# Patient Record
Sex: Female | Born: 1955
Health system: Southern US, Community
[De-identification: ages and names within clinical notes are randomized; demographics above are authoritative.]

## PROBLEM LIST (undated history)

## (undated) DIAGNOSIS — Z87440 Personal history of urinary (tract) infections: Secondary | ICD-10-CM

## (undated) DIAGNOSIS — R112 Nausea with vomiting, unspecified: Secondary | ICD-10-CM

## (undated) DIAGNOSIS — N898 Other specified noninflammatory disorders of vagina: Secondary | ICD-10-CM

## (undated) DIAGNOSIS — R300 Dysuria: Principal | ICD-10-CM

## (undated) DIAGNOSIS — E785 Hyperlipidemia, unspecified: Secondary | ICD-10-CM

## (undated) DIAGNOSIS — Z5189 Encounter for other specified aftercare: Secondary | ICD-10-CM

## (undated) DIAGNOSIS — R319 Hematuria, unspecified: Secondary | ICD-10-CM

## (undated) DIAGNOSIS — M8580 Other specified disorders of bone density and structure, unspecified site: Secondary | ICD-10-CM

## (undated) DIAGNOSIS — Z9889 Other specified postprocedural states: Secondary | ICD-10-CM

## (undated) HISTORY — DX: Other specified disorders of bone density and structure, unspecified site: M85.80

## (undated) HISTORY — DX: Personal history of urinary (tract) infections: Z87.440

## (undated) HISTORY — DX: Encounter for other specified aftercare: Z51.89

## (undated) HISTORY — DX: Hematuria, unspecified: R31.9

## (undated) HISTORY — PX: EYE SURGERY: SHX253

## (undated) HISTORY — DX: Dysuria: R30.0

## (undated) HISTORY — DX: Hyperlipidemia, unspecified: E78.5

## (undated) HISTORY — PX: ABDOMINAL HYSTERECTOMY: SHX81

## (undated) HISTORY — PX: BREAST BIOPSY: SHX20

## (undated) HISTORY — DX: Other specified noninflammatory disorders of vagina: N89.8

## (undated) HISTORY — PX: BREAST EXCISIONAL BIOPSY: SUR124

## (undated) HISTORY — PX: FRACTURE SURGERY: SHX138

## (undated) HISTORY — PX: TONSILLECTOMY: SUR1361

## (undated) HISTORY — PX: DIAGNOSTIC LAPAROSCOPY: SUR761

---

## 2006-04-17 ENCOUNTER — Encounter: Admission: RE | Admit: 2006-04-17 | Discharge: 2006-04-17 | Payer: Self-pay | Admitting: Obstetrics and Gynecology

## 2006-05-20 ENCOUNTER — Ambulatory Visit: Payer: Self-pay | Admitting: Gastroenterology

## 2006-06-12 ENCOUNTER — Ambulatory Visit (HOSPITAL_COMMUNITY): Admission: RE | Admit: 2006-06-12 | Discharge: 2006-06-12 | Payer: Self-pay | Admitting: Obstetrics & Gynecology

## 2006-06-19 ENCOUNTER — Ambulatory Visit (HOSPITAL_COMMUNITY): Admission: RE | Admit: 2006-06-19 | Discharge: 2006-06-19 | Payer: Self-pay | Admitting: Gastroenterology

## 2006-06-19 ENCOUNTER — Ambulatory Visit: Payer: Self-pay | Admitting: Gastroenterology

## 2006-10-30 ENCOUNTER — Encounter: Admission: RE | Admit: 2006-10-30 | Discharge: 2006-10-30 | Payer: Self-pay | Admitting: Obstetrics & Gynecology

## 2007-05-04 ENCOUNTER — Encounter: Admission: RE | Admit: 2007-05-04 | Discharge: 2007-05-04 | Payer: Self-pay | Admitting: Obstetrics & Gynecology

## 2007-11-02 ENCOUNTER — Encounter: Admission: RE | Admit: 2007-11-02 | Discharge: 2007-11-02 | Payer: Self-pay | Admitting: Obstetrics & Gynecology

## 2008-10-20 ENCOUNTER — Encounter: Admission: RE | Admit: 2008-10-20 | Discharge: 2008-10-20 | Payer: Self-pay | Admitting: Obstetrics & Gynecology

## 2009-05-15 ENCOUNTER — Ambulatory Visit: Payer: Self-pay | Admitting: Family Medicine

## 2009-05-24 ENCOUNTER — Ambulatory Visit (HOSPITAL_COMMUNITY): Admission: RE | Admit: 2009-05-24 | Discharge: 2009-05-24 | Payer: Self-pay | Admitting: Obstetrics & Gynecology

## 2009-09-09 ENCOUNTER — Ambulatory Visit: Payer: Self-pay | Admitting: Family Medicine

## 2009-11-20 ENCOUNTER — Encounter: Admission: RE | Admit: 2009-11-20 | Discharge: 2009-11-20 | Payer: Self-pay | Admitting: Obstetrics & Gynecology

## 2010-02-26 NOTE — Letter (Signed)
Summary: Handout Printed  Printed Handout:  - Abscess/Boil (Furuncle), Easy-to-Read

## 2010-02-26 NOTE — Assessment & Plan Note (Signed)
Summary: SPIDER BITES ON LEFT LEG/EVM   Vital Signs:  Patient Profile:   55 Years Old Female CC:      insect bite right anterior leg Height:     64 inches Weight:      112 pounds BMI:     19.29 Temp:     98 degrees F oral Pulse rate:   76 / minute Pulse rhythm:   regular Resp:     18 per minute BP sitting:   110 / 60  (left arm) Cuff size:   regular  Vitals Entered By: Providence Crosby LPN (September 09, 2009 12:20 PM)                  Current Allergies (reviewed today): No known allergies History of Present Illness History from: patient Reason for visit: 8 days ago ? insect bite Chief Complaint: insect bite right anterior leg History of Present Illness: 8 days ago noted bite, painful with walking and touch; gradually getting worse reddened area 4cm in diameter posterior of right leg on calf, pt not sure if this was a true insect bite but is working hard to help it to heal but no significant improvements.  Pt has been using baking soda and other homeopathic remedies but to no avail.  She is afraid of developing cellulitis again.  She had this in April and it was really frightening.  Pt has been having some fever and chills as well.  Pt is really concerned that the lesions are infected.  She works in the health care environment.    REVIEW OF SYSTEMS Constitutional Symptoms       Complains of fever and chills.     Denies night sweats, weight loss, weight gain, and fatigue.      Comments: occasional reported Eyes       Denies change in vision, eye pain, eye discharge, glasses, contact lenses, and eye surgery. Ear/Nose/Throat/Mouth       Denies hearing loss/aids, change in hearing, ear pain, ear discharge, dizziness, frequent runny nose, frequent nose bleeds, sinus problems, sore throat, hoarseness, and tooth pain or bleeding.  Respiratory       Denies dry cough, productive cough, wheezing, shortness of breath, asthma, bronchitis, and emphysema/COPD.  Cardiovascular       Denies  murmurs, chest pain, and tires easily with exhertion.    Gastrointestinal       Denies stomach pain, nausea/vomiting, diarrhea, constipation, blood in bowel movements, and indigestion. Genitourniary       Denies painful urination, kidney stones, and loss of urinary control. Neurological       Denies paralysis, seizures, and fainting/blackouts. Musculoskeletal       Denies muscle pain, joint pain, joint stiffness, decreased range of motion, redness, swelling, muscle weakness, and gout.  Skin       Denies bruising, unusual mles/lumps or sores, and hair/skin or nail changes.      Comments: red area right calf Psych       Denies mood changes, temper/anger issues, anxiety/stress, speech problems, depression, and sleep problems.  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 [employer] Physical Exam General appearance: well developed, well nourished, no acute distress Head: normocephalic, atraumatic Eyes: conjunctivae and lids normal Pupils: equal, round, reactive to light Ears: normal, no lesions or deformities Nasal: mucosa pink, nonedematous, no septal deviation, turbinates normal Oral/Pharynx: tongue normal, posterior pharynx without erythema or exudate Neck: neck supple,  trachea midline, no  masses Chest/Lungs: no rales, wheezes, or rhonchi bilateral, breath sounds equal without effort Heart: regular rate and  rhythm, no murmur Abdomen: soft, non-tender without obvious organomegaly Extremities: Patient had 3 small carbuncles on the posterior right leg in the mid-calf area, one of the lesions is red and tender to palpation, small hole in the center of the lesion, healing scab over the other 2 lesions, no fluctuance noticed, no lymphadenopathy noted, no calf cords palpated, pedal pulses 2+ bilateral Neurological: grossly intact and non-focal Skin: no obvious rashes or lesions MSE: oriented to time, place, and person Assessment New Problems: CARBUNCLE AND FURUNCLE OF LEG EXCEPT FOOT (ICD-680.6) CARBUNCLE, LEG (ICD-680.6) CELLULITIS/ABSCESS, LEG (ICD-682.6)   Patient Education: continue local wound care to the lesions, including topical antibiotic ointment, warm, moist compresses to the area to draw out the toxin.  Demonstrates willingness to comply.  Plan New Medications/Changes: DOXYCYCLINE HYCLATE 100 MG CAPS (DOXYCYCLINE HYCLATE) take 1 by mouth two times a day with food, avoid heavy sun exposure, can cause sun sensitivity, don't take if pregnant.  #14 x 0, 09/09/2009, Standley Dakins MD  New Orders: Est. Patient Level IV [16109] Prescription Created Electronically [phone] Follow Up: Follow up in 2-3 days if no improvement, Follow up on an as needed basis, Follow up with Primary Physician  The patient and/or caregiver has been counseled thoroughly with regard to medications prescribed including dosage, schedule, interactions, rationale for use, and possible side effects and they verbalize understanding.  Diagnoses and expected course of recovery discussed and will return if not improved as expected or if the condition worsens. Patient and/or caregiver verbalized understanding.  Prescriptions: DOXYCYCLINE HYCLATE 100 MG CAPS (DOXYCYCLINE HYCLATE) take 1 by mouth two times a  day with food, avoid heavy sun exposure, can cause sun sensitivity, don't take if pregnant.  #14 x 0   Entered and Authorized by:   Standley Dakins MD   Signed by:   Standley Dakins MD on 09/09/2009   Method used:   Electronically to        [facility] (retail)       [address], [address]       [place], [place]  [address]       Ph: [phone]       Fax: [phone]   RxID:   [id number]   Patient Instructions: 1)  Continue local wound care to the lesion.  Continue topical antibiotic ointment to the lesion and warm, moist compresses to the lesion.   2)  The patient was informed that there is no on-call provider or services available at this clinic during off-hours (when the clinic is closed).  If the patient developed a problem or concern that required immediate attention, the patient was advised to go the the nearest available urgent care or emergency department for medical care.  The patient verbalized understanding.    3)  It was clearly explained to the patient that this [facility] is not intended to be a primary care clinic.  The patient is always better served by the continuity of care and the provider/patient relationships developed with their dedicated primary care provider.  The patient was told to be sure to follow up as soon as possible with their primary care provider to discuss treatments received and to receive further examination and testing.  The patient verbalized understanding. The will f/u with PCP ASAP.  4)  The patient's prescriptions were checked for possible interactions and electronically sent to the pharmacy of choice. 5)  The risks, benefits and possible side effects were clearly explained and discussed with the patient.  The patient verbalized clear understanding.  The patient was given instructions to return if symptoms don't improve, worsen or new changes develop.  If it is not during clinic hours and the patient  cannot get back to this clinic then the patient was told to seek medical care at an available urgent care or emergency department.  The patient verbalized understanding.      Orders Added: 1)  Est. Patient Level IV [14782] 2)  Prescription Created Electronically [phone]

## 2010-02-26 NOTE — Assessment & Plan Note (Signed)
Summary: UNKNOWN   Vital Signs:  Patient Profile:   55 Years Old Female CC:      Tick bite lower left back x 5 days Height:     64 inches Weight:      118 pounds O2 Sat:      100 % O2 treatment:    Room Air Temp:     96.3 degrees F oral Pulse rate:   69 / minute Pulse rhythm:   regular Resp:     12 per minute BP sitting:   106 / 63  (right arm) Cuff size:   regular  Vitals Entered By: Emilio Math (May 15, 2009 12:01 PM)                  Current Allergies: No known allergies History of Present Illness Chief Complaint: Tick bite lower left back x 5 days History of Present Illness: Subjective:  Patient complains of two tick bites on lower back that she noticed 3 days ago.  She was able to remove the ticks, which were about 3 to 4mm dia.  She feels well.  No fevers, chills, and sweats, headache, malaise, arthralgias, etc.  No rash.  Current Meds DAILY MULTIPLE VITAMINS  TABS (MULTIPLE VITAMIN)  SM CORAL CALCIUM 1000 (390 CA) MG TABS (CORAL CALCIUM)  DOXYCYCLINE HYCLATE 100 MG CAPS (DOXYCYCLINE HYCLATE) One by mouth two times a day  REVIEW OF SYSTEMS Constitutional Symptoms      Denies fever, chills, night sweats, weight loss, weight gain, and fatigue.  Eyes       Denies change in vision, eye pain, eye discharge, glasses, contact lenses, and eye surgery. Ear/Nose/Throat/Mouth       Denies hearing loss/aids, change in hearing, ear pain, ear discharge, dizziness, frequent runny nose, frequent nose bleeds, sinus problems, sore throat, hoarseness, and tooth pain or bleeding.  Respiratory       Denies dry cough, productive cough, wheezing, shortness of breath, asthma, bronchitis, and emphysema/COPD.  Cardiovascular       Denies murmurs, chest pain, and tires easily with exhertion.    Gastrointestinal       Denies stomach pain, nausea/vomiting, diarrhea, constipation, blood in bowel movements, and indigestion. Genitourniary       Denies painful urination, kidney stones,  and loss of urinary control. Neurological       Denies paralysis, seizures, and fainting/blackouts. Musculoskeletal       Complains of redness and swelling.      Denies muscle pain, joint pain, joint stiffness, decreased range of motion, muscle weakness, and gout.  Skin       Denies bruising, unusual mles/lumps or sores, and hair/skin or nail changes.  Psych       Denies mood changes, temper/anger issues, anxiety/stress, speech problems, depression, and sleep problems.  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  No tenderness or fluctuance. Assessment New Problems: CELLULITIS, TRUNK (ICD-682.2) TICK BITE (ICD-E906.4)   Plan New Medications/Changes: DOXYCYCLINE HYCLATE 100 MG CAPS (DOXYCYCLINE HYCLATE) One by mouth two times a day  #14 x 0, 05/15/2009, Donna Christen MD  New  Orders: New Patient Level III [99203] Planning Comments:   Begin doxycycline 100mg  two times a day for one week.  Return for worsening symptoms:  headache, rash, fever, arthralgias, etc.   The patient and/or caregiver has been counseled thoroughly with regard to medications prescribed including dosage, schedule, interactions, rationale for use, and possible side effects and they verbalize understanding.  Diagnoses and expected course of recovery discussed and will return if not improved as expected or if the condition worsens. Patient and/or caregiver verbalized understanding.  Prescriptions: DOXYCYCLINE HYCLATE 100 MG CAPS (DOXYCYCLINE HYCLATE) One  by mouth two times a day  #14 x 0   Entered and Authorized by:   Donna Christen MD   Signed by:   Donna Christen MD on 05/15/2009   Method used:   Print then Give to Patient   RxID:   [id number]

## 2010-06-11 NOTE — Op Note (Signed)
NAME:  Kylie Duffy, Kylie Duffy                 ACCOUNT NO.:  [id number]   MEDICAL RECORD NO.:  [id number]          PATIENT TYPE:  AMB   LOCATION:  DAY                           FACILITY:  [facility]   PHYSICIAN:  Kassie Mends, M.D.      DATE OF BIRTH:  21-Jun-1955   DATE OF PROCEDURE:  06/19/3006  DATE OF DISCHARGE:                                PROCEDURE NOTE   REFERRING PHYSICIAN:  Tilda Burrow, M.D.   PROCEDURE:  Colonoscopy.   INDICATION FOR EXAMINATION:  [name] is a 55 year old female who has  intermittent bright red blood per rectum and was found to have heme-  positive stools.  She presents for evaluation for these findings and for  colon cancer screening.   FINDINGS:  1. External hemorrhoids seen on rectal exam.  2. Normal colon without evidence of polyps, masses, inflammatory      changes, diverticula, AVMs or hemorrhoids.   RECOMMENDATIONS:  1. She should follow a high-fiber diet.  She was given a handout on      high-fiber diet  and hemorrhoids.  2. Screening colonoscopy in 10 years.   MEDICATIONS:  1. Demerol 100 mg IV.  2. Versed 7 mg IV.   PROCEDURAL TECHNIQUE:  Physical exam was performed.  Informed consent  was obtained from the patient.  I explained the benefits, risks and  alternatives to the procedure.  The patient was connected to the monitor  and placed in the left lateral position.  Continuous oxygen was provided  by nasal cannula and IV medicine administered through an indwelling  cannula.  After administration of sedation and rectal exam, the  patient's rectum was intubated and the scope was advanced under direct  visualization to the cecum.  The patient, in spite of sedation, was  agitated  during the exam and did require some restraint during the exam.  The  scope was subsequently withdrawn by carefully examining the color,  texture, anatomy and integrity of the mucosa on the way out.  The  patient was recovered in endoscopy and discharged home in  satisfactory  condition.      Kassie Mends, M.D.  Electronically Signed     SM/MEDQ  D:  06/19/2006  T:  06/19/2006  Job:  [id number]   cc:   Tilda Burrow, M.D.  Fax: [phone]

## 2010-06-14 NOTE — Consult Note (Signed)
NAME:  Kylie Duffy, Kylie Duffy                 ACCOUNT NO.:  [id number]   MEDICAL RECORD NO.:  [id number]         PATIENT TYPE:  AMB   LOCATION:                                FACILITY:  [facility]   PHYSICIAN:  Kassie Mends, M.D.      DATE OF BIRTH:  1955/02/09   DATE OF CONSULTATION:  05/20/2006  DATE OF DISCHARGE:                                 CONSULTATION   REASON FOR CONSULTATION:  Heme-positive stool.   REQUESTING PHYSICIAN:  Tilda Burrow, M.D.   HISTORY OF PRESENT ILLNESS:  Patient is a 55 year old Caucasian female  patient of Dr. Christin Bach who presents for further evaluation of heme-  positive stool found on digital rectal exam recently.  She has chronic  intermittent hematochezia.  She states that this has been due to  hemorrhoids.  She has a history of thrombosed hemorrhoid requiring  excision in March 2005.  This is complicated by several days of  significant bleeding but did not require transfusion.  She states that  she has done fairly well since that time.  As long as she eats a healthy  diet, she has regular bowel movements.  No melena.  No abdominal pain,  nausea, vomiting, heartburn, dysphagia, odynophagia, or weight loss.  She has never had a colonoscopy.   CURRENT MEDICATIONS:  1. Multivitamins daily.  2. Calcium supplement daily.   ALLERGIES:  No known drug allergies.   PAST MEDICAL HISTORY:  Negative for chronic illnesses.   PAST SURGICAL HISTORY:  She had surgery for ectopic pregnancy, a  hysterectomy in 2002, a D&C, and thrombosed hemorrhoids.   FAMILY HISTORY:  Father has CAD.  Has had cardiac stents.  Mother has  hypercholesterolemia, hypothyroidism, hypertension.  No family history  of colorectal cancer or polyps.   SOCIAL HISTORY:  She is married with no children.  She is from  [place] and has been in the area only recently.  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  Nonsmoker.  Infrequent  alcohol use.   REVIEW OF  SYSTEMS:  See HPI for GI, constitutional.  CARDIOPULMONARY:  No chest pain, shortness of breath, or palpitations.   PHYSICAL EXAMINATION:  VITAL SIGNS:  Weight 118.  Height 5 feet 4.  Temp  98.7, blood pressure 102/68, pulse 84.  GENERAL:  A pleasant, well-developed and well-nourished Caucasian female  in no acute distress.  SKIN:  Warm and dry with no jaundice.  HEENT:  Sclerae are anicteric.  Oropharyngeal mucosa is moist and pink.  No lesions, erythema, or exudate.  NECK:  No lymphadenopathy or thyromegaly.  CHEST:  Lungs are clear to auscultation.  CARDIAC:  Regular rate and rhythm.  Normal S1 and S2.  No murmurs, rubs  or gallops.  ABDOMEN:  Positive bowel sounds.  Abdomen is soft, nontender and  nondistended.  No organomegaly or masses.  No rebound tenderness or  guarding.  No abdominal bruits or hernias.  EXTREMITIES:  No edema.   IMPRESSION:  Kylie Duffy is a 55 year old lady with a history of  intermittent hematochezia who recently  had heme-positive stools on  digital rectal exam.  She has never had a colonoscopy.  Recommend  diagnostic colonoscopy at this time for further evaluation.  I discussed  the risks, benefits and alternatives with regards to the risk of  reaction to medication, bleeding, infection, perforation.  She is  agreeable to proceed.   PLAN:  Colonoscopy in the near future with Dr. Kassie Mends.   I would like to thank Dr. Christin Bach for allowing [place] to take part in  the care of this patient.      Tana Coast, P.A.      Kassie Mends, M.D.  Electronically Signed    LL/MEDQ  D:  05/20/2006  T:  05/20/2006  Job:  [id number]   cc:   Tilda Burrow, M.D.  Fax: [phone]

## 2010-10-28 ENCOUNTER — Other Ambulatory Visit: Payer: Self-pay | Admitting: Obstetrics and Gynecology

## 2010-10-28 DIAGNOSIS — Z1231 Encounter for screening mammogram for malignant neoplasm of breast: Secondary | ICD-10-CM

## 2010-11-22 ENCOUNTER — Ambulatory Visit
Admission: RE | Admit: 2010-11-22 | Discharge: 2010-11-22 | Disposition: A | Discharge status: Home / Self Care | Payer: 59 | Source: Ambulatory Visit | Attending: Obstetrics and Gynecology | Admitting: Obstetrics and Gynecology

## 2010-11-22 DIAGNOSIS — Z1231 Encounter for screening mammogram for malignant neoplasm of breast: Secondary | ICD-10-CM

## 2011-02-07 ENCOUNTER — Other Ambulatory Visit: Payer: Self-pay | Admitting: Obstetrics & Gynecology

## 2011-02-07 DIAGNOSIS — N63 Unspecified lump in unspecified breast: Secondary | ICD-10-CM

## 2011-02-14 ENCOUNTER — Other Ambulatory Visit: Payer: 59

## 2011-02-21 ENCOUNTER — Other Ambulatory Visit: Payer: 59

## 2011-02-28 ENCOUNTER — Ambulatory Visit
Admission: RE | Admit: 2011-02-28 | Discharge: 2011-02-28 | Disposition: A | Discharge status: Home / Self Care | Payer: 59 | Source: Ambulatory Visit | Attending: Obstetrics & Gynecology | Admitting: Obstetrics & Gynecology

## 2011-02-28 DIAGNOSIS — N63 Unspecified lump in unspecified breast: Secondary | ICD-10-CM

## 2011-04-11 ENCOUNTER — Emergency Department (HOSPITAL_COMMUNITY): Payer: 59

## 2011-04-11 ENCOUNTER — Emergency Department (HOSPITAL_COMMUNITY)
Admission: EM | Admit: 2011-04-11 | Discharge: 2011-04-11 | Disposition: A | Discharge status: Home / Self Care | Payer: 59 | Attending: Emergency Medicine | Admitting: Emergency Medicine

## 2011-04-11 ENCOUNTER — Encounter (HOSPITAL_COMMUNITY): Payer: Self-pay

## 2011-04-11 DIAGNOSIS — S0990XA Unspecified injury of head, initial encounter: Secondary | ICD-10-CM | POA: Insufficient documentation

## 2011-04-11 DIAGNOSIS — S00209A Unspecified superficial injury of unspecified eyelid and periocular area, initial encounter: Secondary | ICD-10-CM | POA: Insufficient documentation

## 2011-04-11 DIAGNOSIS — R51 Headache: Secondary | ICD-10-CM | POA: Insufficient documentation

## 2011-04-11 DIAGNOSIS — R220 Localized swelling, mass and lump, head: Secondary | ICD-10-CM | POA: Insufficient documentation

## 2011-04-11 DIAGNOSIS — M25569 Pain in unspecified knee: Secondary | ICD-10-CM | POA: Insufficient documentation

## 2011-04-11 DIAGNOSIS — T07XXXA Unspecified multiple injuries, initial encounter: Secondary | ICD-10-CM

## 2011-04-11 DIAGNOSIS — W010XXA Fall on same level from slipping, tripping and stumbling without subsequent striking against object, initial encounter: Secondary | ICD-10-CM | POA: Insufficient documentation

## 2011-04-11 DIAGNOSIS — S8000XA Contusion of unspecified knee, initial encounter: Secondary | ICD-10-CM | POA: Insufficient documentation

## 2011-04-11 DIAGNOSIS — M25539 Pain in unspecified wrist: Secondary | ICD-10-CM | POA: Insufficient documentation

## 2011-04-11 DIAGNOSIS — R221 Localized swelling, mass and lump, neck: Secondary | ICD-10-CM | POA: Insufficient documentation

## 2011-04-11 DIAGNOSIS — IMO0002 Reserved for concepts with insufficient information to code with codable children: Secondary | ICD-10-CM | POA: Insufficient documentation

## 2011-04-11 DIAGNOSIS — T148XXA Other injury of unspecified body region, initial encounter: Secondary | ICD-10-CM

## 2011-04-11 DIAGNOSIS — S0003XA Contusion of scalp, initial encounter: Secondary | ICD-10-CM | POA: Insufficient documentation

## 2011-04-11 DIAGNOSIS — S0083XA Contusion of other part of head, initial encounter: Secondary | ICD-10-CM | POA: Insufficient documentation

## 2011-04-11 MED ORDER — BACITRACIN-NEOMYCIN-POLYMYXIN 400-5-5000 EX OINT
TOPICAL_OINTMENT | Freq: Once | CUTANEOUS | Status: AC
  Administered 2011-04-11: 1 via TOPICAL
  Filled 2011-04-11: qty 2

## 2011-04-11 MED ORDER — HYDROCODONE-ACETAMINOPHEN 7.5-500 MG/15ML PO SOLN: ORAL | Status: DC

## 2011-04-11 MED ORDER — HYDROCODONE-ACETAMINOPHEN 7.5-500 MG/15ML PO SOLN
5.0000 mL | Freq: Once | ORAL | Status: AC
  Administered 2011-04-11: 5 mL via ORAL
  Filled 2011-04-11: qty 15

## 2011-04-11 NOTE — Discharge Instructions (Signed)
Wrist Pain Wrist injuries are frequent in adults and children. A sprain is an injury to the ligaments that hold your bones together. A strain is an injury to muscle or muscle cord-like structures (tendons) from stretching or pulling. Generally, when wrists are moderately tender to touch following a fall or injury, a break in the bone (fracture) may be present. Most wrist sprains or strains are better in 3 to 5 days, but complete healing may take several weeks. HOME CARE INSTRUCTIONS   Put ice on the injured area.   Put ice in a plastic bag.   Place a towel between your skin and the bag.   Leave the ice on for 15 to 20 minutes, 3 to 4 times a day, for the first 2 days.   Keep your arm raised above the level of your heart whenever possible to reduce swelling and pain.   Rest the injured area for at least 48 hours or as directed by your caregiver.   If a splint or elastic bandage has been applied, use it for as long as directed by your caregiver or until seen by a caregiver for a follow-up exam.   Only take over-the-counter or prescription medicines for pain, discomfort, or fever as directed by your caregiver.   Keep all follow-up appointments. You may need to follow up with a specialist or have follow-up X-rays. Improvement in pain level is not a guarantee that you did not fracture a bone in your wrist. The only way to determine whether or not you have a broken bone is by X-ray.  SEEK IMMEDIATE MEDICAL CARE IF:   Your fingers are swollen, very red, white, or cold and blue.   Your fingers are numb or tingling.   You have increasing pain.   You have difficulty moving your fingers.  MAKE SURE YOU:   Understand these instructions.   Will watch your condition.   Will get help right away if you are not doing well or get worse.  Document Released: 10/23/2004 Document Revised: 01/02/2011 Document Reviewed: 03/06/2010 [facility] Patient Information 2012 [place], [place].   Your xrays and  Ct scans are normal today for any sign of serious internal injury.  Use the medicine prescribed for your pain,  Continue using ice (every hour for 5-10 minutes) for the next day.  Expect gradual improvement over the next week.

## 2011-04-11 NOTE — ED Notes (Signed)
Tripped and fell today, laceration to left eye, bil wrist pain, and left knee pain. Denies any loc.

## 2011-04-13 NOTE — ED Provider Notes (Signed)
History     CSN: [id number]  Arrival date & time 04/11/11  1333   First MD Initiated Contact with Patient 04/11/11 1402      Chief Complaint  Patient presents with  . Fall  . Facial Laceration    (Consider location/radiation/quality/duration/timing/severity/associated sxs/prior treatment) Patient is a 56 y.o. female presenting with fall. The history is provided by the patient.  Fall The accident occurred less than 1 hour ago. The fall occurred while walking (Patient tripped over a curb walking through a parking lot prior to arrival.). She landed on concrete. The volume of blood lost was minimal. The point of impact was the head, left knee and left wrist. The pain is present in the left wrist, left knee and head. The pain is at a severity of 6/10. The pain is moderate. She was ambulatory at the scene. There was no entrapment after the fall. Pertinent negatives include no visual change, no fever, no numbness, no abdominal pain, no nausea, no vomiting, no headaches and no loss of consciousness. The symptoms are aggravated by pressure on the injury. She has tried nothing for the symptoms.    History reviewed. No pertinent past medical history.  Past Surgical History  Procedure Date  . Abdominal hysterectomy   . Tonsillectomy     No family history on file.  History  Substance Use Topics  . Smoking status: Never Smoker   . Smokeless tobacco: Not on file  . Alcohol Use: No    OB History    Grav Para Term Preterm Abortions TAB SAB Ect Mult Living                  Review of Systems  Constitutional: Negative for fever.  HENT: Negative for hearing loss, nosebleeds, congestion, sore throat and neck pain.   Eyes: Negative.  Negative for pain and visual disturbance.  Respiratory: Negative for shortness of breath.   Cardiovascular: Negative for chest pain.  Gastrointestinal: Negative for nausea, vomiting and abdominal pain.  Genitourinary: Negative.   Musculoskeletal: Positive  for arthralgias. Negative for joint swelling.  Skin: Positive for wound. Negative for rash.  Neurological: Negative for dizziness, loss of consciousness, weakness, light-headedness, numbness and headaches.  Hematological: Negative.   Psychiatric/Behavioral: Negative.     Allergies  Review of patient's allergies indicates no known allergies.  Home Medications   Current Outpatient Rx  Name Route Sig Dispense Refill  . CALCIUM 600 + D PO Oral Take 1 tablet by mouth daily.    . ADULT MULTIVITAMIN W/MINERALS CH Oral Take 1 tablet by mouth daily.    Marland Kitchen HYDROCODONE-ACETAMINOPHEN 7.5-500 MG/15ML PO SOLN  1/2 to 1 teaspoon by mouth every 4 hours as needed for pain. 75 mL 0    BP 131/81  Pulse 70  Temp(Src) 98 F (36.7 C) (Oral)  Resp 20  Ht 5\' 4"  (1.626 m)  Wt 120 lb (54.432 kg)  BMI 20.60 kg/m2  SpO2 100%  Physical Exam  Nursing note and vitals reviewed. Constitutional: She is oriented to person, place, and time. She appears well-developed and well-nourished.  HENT:  Head: Normocephalic.    Right Ear: External ear normal.  Left Ear: External ear normal.  Nose: Nose normal. No sinus tenderness or nasal deformity. No epistaxis.  Mouth/Throat: Uvula is midline and oropharynx is clear and moist.       Abrasion at left lateral periorbital location,  Hemostatic (suspect injury from glasses hitting skin).   Upper lip contusion and edema,  Without laceration.  No dental injury,  Dentition intact and no abrupted gingiva.  Eyes: Conjunctivae and EOM are normal. Pupils are equal, round, and reactive to light.  Neck: Normal range of motion and full passive range of motion without pain. Neck supple. No spinous process tenderness and no muscular tenderness present.  Cardiovascular: Normal rate, regular rhythm, normal heart sounds and intact distal pulses.   Pulses:      Radial pulses are 2+ on the right side, and 2+ on the left side.  Pulmonary/Chest: Effort normal and breath sounds normal.    Abdominal: Soft. Bowel sounds are normal. There is no tenderness.  Musculoskeletal: Normal range of motion.       Left wrist: She exhibits bony tenderness. She exhibits normal range of motion, no swelling, no effusion, no crepitus and no deformity.       Left knee: She exhibits swelling, ecchymosis and bony tenderness. She exhibits no deformity and no erythema.  Neurological: She is alert and oriented to person, place, and time.  Skin: Skin is warm and dry.       Deep abrasion right dorsal middle phalanx.  Displays FROM of this digit without discomfort.  Distal sensation intact.  Psychiatric: She has a normal mood and affect.    ED Course  Procedures (including critical care time   Dg Wrist Complete Left  04/11/2011  *RADIOLOGY REPORT*  Clinical Data: Fall, facial laceration  LEFT WRIST - COMPLETE 3+ VIEW  Comparison: None.  Findings: No evidence of fracture of the distal radius or ulna. Radiocarpal joint is intact.  No carpal fracture.  IMPRESSION: No wrist fracture.  Original Report Authenticated By: Genevive Bi, M.D.   Ct Head Wo Contrast  04/11/2011  *RADIOLOGY REPORT*  Clinical Data:  Facial abrasions.  Fall.  CT HEAD WITHOUT CONTRAST CT MAXILLOFACIAL WITHOUT CONTRAST  Technique:  Multidetector CT imaging of the head and maxillofacial structures were performed using the standard protocol without intravenous contrast. Multiplanar CT image reconstructions of the maxillofacial structures were also generated.  Comparison:   None.  CT HEAD  Findings: The brain stem, cerebellum, cerebral peduncles, thalami, basal ganglia, basilar cisterns, and ventricular system appear unremarkable.  No intracranial hemorrhage, mass lesion, or acute infarction is identified.  IMPRESSION:  No significant abnormality identified.  CT MAXILLOFACIAL  Findings:   Left infraorbital soft tissue swelling noted.  No facial fracture is observed.  No significant sinusitis.  Middle turbinate concha bullosa noted  bilaterally.  There is considerable right facet arthropathy at C3-4.  IMPRESSION:  1.  Left infraorbital soft tissue swelling, without significant intra orbital abnormality and without facial fracture. 2.  Right facet arthropathy at C3-4 and to a lesser extent at C4-5.  Original Report Authenticated By: Dellia Cloud, M.D.   Dg Knee Complete 4 Views Left  04/11/2011  *RADIOLOGY REPORT*  Clinical Data: Fall, facial laceration.  LEFT KNEE - COMPLETE 4+ VIEW  Comparison: None.  Findings: No acute bony abnormality.  Specifically, no fracture, subluxation, or dislocation.  Soft tissues are intact.  No joint effusion.  IMPRESSION: No acute bony abnormality.  The  Original Report Authenticated By: Cyndie Chime, M.D.   Ct Maxillofacial Wo Cm  04/11/2011  *RADIOLOGY REPORT*  Clinical Data:  Facial abrasions.  Fall.  CT HEAD WITHOUT CONTRAST CT MAXILLOFACIAL WITHOUT CONTRAST  Technique:  Multidetector CT imaging of the head and maxillofacial structures were performed using the standard protocol without intravenous contrast. Multiplanar CT image reconstructions of the maxillofacial structures were also generated.  Comparison:  None.  CT HEAD  Findings: The brain stem, cerebellum, cerebral peduncles, thalami, basal ganglia, basilar cisterns, and ventricular system appear unremarkable.  No intracranial hemorrhage, mass lesion, or acute infarction is identified.  IMPRESSION:  No significant abnormality identified.  CT MAXILLOFACIAL  Findings:   Left infraorbital soft tissue swelling noted.  No facial fracture is observed.  No significant sinusitis.  Middle turbinate concha bullosa noted bilaterally.  There is considerable right facet arthropathy at C3-4.  IMPRESSION:  1.  Left infraorbital soft tissue swelling, without significant intra orbital abnormality and without facial fracture. 2.  Right facet arthropathy at C3-4 and to a lesser extent at C4-5.  Original Report Authenticated By: Dellia Cloud, M.D.        1. Contusion of multiple sites   2. Wrist pain   3. Abrasion   4. Minor head injury    Rn provided wound care with dressings to abrasions.  Prescribed hydrocodone solution (so can take less than 5 mg hydrocodone),  Ace wrap to left wrist,  Ice,  Pt advised she will probably feel more sore tomorrow.  Can also take ibuprofen for pain.     MDM   Minor head injury without loc,  No signs of post concussion sx.  Exam not suspicious for eye injury.    Candis Musa, PA 04/13/11 2238

## 2011-04-14 NOTE — ED Provider Notes (Signed)
Medical screening examination/treatment/procedure(s) were performed by non-physician practitioner and as supervising physician I was immediately available for consultation/collaboration.  Jonaya Freshour, MD 04/14/11 1101 

## 2011-04-29 ENCOUNTER — Other Ambulatory Visit (HOSPITAL_COMMUNITY): Payer: Self-pay | Admitting: *Deleted

## 2011-04-29 ENCOUNTER — Other Ambulatory Visit (HOSPITAL_COMMUNITY): Payer: Self-pay | Admitting: Orthopedic Surgery

## 2011-04-29 ENCOUNTER — Ambulatory Visit (HOSPITAL_COMMUNITY)
Admission: RE | Admit: 2011-04-29 | Discharge: 2011-04-29 | Disposition: A | Discharge status: Home / Self Care | Payer: 59 | Source: Ambulatory Visit | Attending: Orthopedic Surgery | Admitting: Orthopedic Surgery

## 2011-04-29 DIAGNOSIS — M25562 Pain in left knee: Secondary | ICD-10-CM

## 2011-04-29 DIAGNOSIS — M7120 Synovial cyst of popliteal space [Baker], unspecified knee: Secondary | ICD-10-CM | POA: Insufficient documentation

## 2011-04-29 DIAGNOSIS — W19XXXA Unspecified fall, initial encounter: Secondary | ICD-10-CM

## 2011-04-29 DIAGNOSIS — IMO0002 Reserved for concepts with insufficient information to code with codable children: Secondary | ICD-10-CM | POA: Insufficient documentation

## 2011-04-29 DIAGNOSIS — R609 Edema, unspecified: Secondary | ICD-10-CM

## 2011-04-29 DIAGNOSIS — M25469 Effusion, unspecified knee: Secondary | ICD-10-CM | POA: Insufficient documentation

## 2011-04-29 DIAGNOSIS — M25569 Pain in unspecified knee: Secondary | ICD-10-CM | POA: Insufficient documentation

## 2011-05-01 ENCOUNTER — Other Ambulatory Visit (HOSPITAL_COMMUNITY): Payer: 59

## 2011-06-04 ENCOUNTER — Other Ambulatory Visit: Payer: Self-pay | Admitting: Orthopedic Surgery

## 2011-06-04 DIAGNOSIS — M659 Synovitis and tenosynovitis, unspecified: Secondary | ICD-10-CM

## 2011-06-04 DIAGNOSIS — L0390 Cellulitis, unspecified: Secondary | ICD-10-CM

## 2011-06-04 MED ORDER — CEPHALEXIN 500 MG PO CAPS: 500.0000 mg | ORAL_CAPSULE | Freq: Three times a day (TID) | ORAL | Status: AC

## 2011-06-04 MED ORDER — PREDNISONE 5 MG PO TABS: 5.0000 mg | ORAL_TABLET | Freq: Every day | ORAL | Status: DC

## 2011-06-12 ENCOUNTER — Other Ambulatory Visit: Payer: Self-pay | Admitting: Orthopedic Surgery

## 2011-06-12 ENCOUNTER — Encounter: Payer: Self-pay | Admitting: Orthopedic Surgery

## 2011-06-12 ENCOUNTER — Ambulatory Visit (INDEPENDENT_AMBULATORY_CARE_PROVIDER_SITE_OTHER): Payer: 59

## 2011-06-12 DIAGNOSIS — M79609 Pain in unspecified limb: Secondary | ICD-10-CM

## 2011-06-12 NOTE — Progress Notes (Signed)
Patient ID: Kylie Duffy Section, female   DOB: October 20, 1955, 56 y.o.   MRN: [id number] Pain in the LEFT foot, over the 1st metatarsal questionable time of injury.  Pain persisted with swelling and limping.  X-ray taken shows left 4th metatarsal fracture.  Treatment will be discussed with patient ref hard sole shoe

## 2011-07-10 ENCOUNTER — Other Ambulatory Visit: Payer: Self-pay | Admitting: Radiology

## 2011-07-10 ENCOUNTER — Ambulatory Visit (INDEPENDENT_AMBULATORY_CARE_PROVIDER_SITE_OTHER): Payer: 59

## 2011-07-10 DIAGNOSIS — S92909A Unspecified fracture of unspecified foot, initial encounter for closed fracture: Secondary | ICD-10-CM

## 2011-07-14 ENCOUNTER — Other Ambulatory Visit: Payer: Self-pay | Admitting: Radiology

## 2011-07-14 ENCOUNTER — Ambulatory Visit (INDEPENDENT_AMBULATORY_CARE_PROVIDER_SITE_OTHER): Payer: 59

## 2011-07-14 DIAGNOSIS — M79609 Pain in unspecified limb: Secondary | ICD-10-CM

## 2011-07-14 DIAGNOSIS — M79643 Pain in unspecified hand: Secondary | ICD-10-CM

## 2011-07-24 ENCOUNTER — Other Ambulatory Visit: Payer: Self-pay | Admitting: Radiology

## 2011-07-24 ENCOUNTER — Ambulatory Visit (INDEPENDENT_AMBULATORY_CARE_PROVIDER_SITE_OTHER): Payer: 59

## 2011-07-24 DIAGNOSIS — S92902A Unspecified fracture of left foot, initial encounter for closed fracture: Secondary | ICD-10-CM

## 2011-07-24 DIAGNOSIS — S92909A Unspecified fracture of unspecified foot, initial encounter for closed fracture: Secondary | ICD-10-CM

## 2011-11-21 ENCOUNTER — Other Ambulatory Visit: Payer: Self-pay | Admitting: Obstetrics and Gynecology

## 2011-11-21 DIAGNOSIS — Z1231 Encounter for screening mammogram for malignant neoplasm of breast: Secondary | ICD-10-CM

## 2011-12-05 ENCOUNTER — Ambulatory Visit
Admission: RE | Admit: 2011-12-05 | Discharge: 2011-12-05 | Disposition: A | Discharge status: Home / Self Care | Payer: 59 | Source: Ambulatory Visit | Attending: Obstetrics and Gynecology | Admitting: Obstetrics and Gynecology

## 2011-12-05 DIAGNOSIS — Z1231 Encounter for screening mammogram for malignant neoplasm of breast: Secondary | ICD-10-CM

## 2011-12-11 ENCOUNTER — Other Ambulatory Visit: Payer: Self-pay | Admitting: Obstetrics and Gynecology

## 2011-12-11 DIAGNOSIS — R928 Other abnormal and inconclusive findings on diagnostic imaging of breast: Secondary | ICD-10-CM

## 2011-12-18 ENCOUNTER — Ambulatory Visit
Admission: RE | Admit: 2011-12-18 | Discharge: 2011-12-18 | Disposition: A | Discharge status: Home / Self Care | Payer: 59 | Source: Ambulatory Visit | Attending: Obstetrics and Gynecology | Admitting: Obstetrics and Gynecology

## 2011-12-18 DIAGNOSIS — R928 Other abnormal and inconclusive findings on diagnostic imaging of breast: Secondary | ICD-10-CM

## 2012-01-12 ENCOUNTER — Other Ambulatory Visit: Payer: Self-pay | Admitting: Orthopedic Surgery

## 2012-01-12 ENCOUNTER — Encounter (HOSPITAL_COMMUNITY): Payer: Self-pay | Admitting: Pharmacy Technician

## 2012-01-15 ENCOUNTER — Telehealth: Payer: Self-pay | Admitting: Orthopedic Surgery

## 2012-01-15 NOTE — Telephone Encounter (Signed)
Placed call to [facility], [phone], per Rosalio Loud, no authorization required for outpatient surgery,29881/717.2 & 836.0. Reference # [id number]

## 2012-01-16 NOTE — Patient Instructions (Addendum)
20 AIZA VOLLRATH  01/16/2012   Your procedure is scheduled on:  01/26/2012  Report to [facility] at  615 AM.  Call this number if you have problems the morning of surgery: [phone]   Remember:   Do not eat food:After Midnight.  May have clear liquids:until Midnight .    Take these medicines the morning of surgery with A SIP OF WATER: none   Do not wear jewelry, make-up or nail polish.  Do not wear lotions, powders, or perfumes.   Do not shave 48 hours prior to surgery. Men may shave face and neck.  Do not bring valuables to the hospital.  Contacts, dentures or bridgework may not be worn into surgery.  Leave suitcase in the car. After surgery it may be brought to your room.  For patients admitted to the hospital, checkout time is 11:00 AM the day of discharge.   Patients discharged the day of surgery will not be allowed to drive home.  Name and phone number of your driver: family  Special Instructions: Shower using CHG 2 nights before surgery and the night before surgery.  If you shower the day of surgery use CHG.  Use special wash - you have one bottle of CHG for all showers.  You should use approximately 1/3 of the bottle for each shower.   Please read over the following fact sheets that you were given: Pain Booklet, Coughing and Deep Breathing, MRSA Information, Surgical Site Infection Prevention, Anesthesia Post-op Instructions and Care and Recovery After Surgery Arthroscopic Procedure, Knee An arthroscopic procedure can find what is wrong with your knee. PROCEDURE Arthroscopy is a surgical technique that allows your orthopedic surgeon to diagnose and treat your knee injury with accuracy. They will look into your knee through a small instrument. This is almost like a small (pencil sized) telescope. Because arthroscopy affects your knee less than open knee surgery, you can anticipate a more rapid recovery. Taking an active role by following your caregiver's instructions will  help with rapid and complete recovery. Use crutches, rest, elevation, ice, and knee exercises as instructed. The length of recovery depends on various factors including type of injury, age, physical condition, medical conditions, and your rehabilitation. Your knee is the joint between the large bones (femur and tibia) in your leg. Cartilage covers these bone ends which are smooth and slippery and allow your knee to bend and move smoothly. Two menisci, thick, semi-lunar shaped pads of cartilage which form a rim inside the joint, help absorb shock and stabilize your knee. Ligaments bind the bones together and support your knee joint. Muscles move the joint, help support your knee, and take stress off the joint itself. Because of this all programs and physical therapy to rehabilitate an injured or repaired knee require rebuilding and strengthening your muscles. AFTER THE PROCEDURE  After the procedure, you will be moved to a recovery area until most of the effects of the medication have worn off. Your caregiver will discuss the test results with you.  Only take over-the-counter or prescription medicines for pain, discomfort, or fever as directed by your caregiver. SEEK MEDICAL CARE IF:   You have increased bleeding from your wounds.  You see redness, swelling, or have increasing pain in your wounds.  You have pus coming from your wound.  You have an oral temperature above 102 F (38.9 C).  You notice a bad smell coming from the wound or dressing.  You have severe pain with any  motion of your knee. SEEK IMMEDIATE MEDICAL CARE IF:   You develop a rash.  You have difficulty breathing.  You have any allergic problems. Document Released: 01/11/2000 Document Revised: 04/07/2011 Document Reviewed: 08/04/2007 [facility] Patient Information 2013 [place], [place]. PATIENT INSTRUCTIONS POST-ANESTHESIA  IMMEDIATELY FOLLOWING SURGERY:  Do not drive or operate machinery for the first twenty four hours  after surgery.  Do not make any important decisions for twenty four hours after surgery or while taking narcotic pain medications or sedatives.  If you develop intractable nausea and vomiting or a severe headache please notify your doctor immediately.  FOLLOW-UP:  Please make an appointment with your surgeon as instructed. You do not need to follow up with anesthesia unless specifically instructed to do so.  WOUND CARE INSTRUCTIONS (if applicable):  Keep a dry clean dressing on the anesthesia/puncture wound site if there is drainage.  Once the wound has quit draining you may leave it open to air.  Generally you should leave the bandage intact for twenty four hours unless there is drainage.  If the epidural site drains for more than 36-48 hours please call the anesthesia department.  QUESTIONS?:  Please feel free to call your physician or the hospital operator if you have any questions, and they will be happy to assist you.

## 2012-01-19 ENCOUNTER — Encounter (HOSPITAL_COMMUNITY)
Admission: RE | Admit: 2012-01-19 | Discharge: 2012-01-19 | Disposition: A | Discharge status: Home / Self Care | Payer: 59 | Source: Ambulatory Visit | Attending: Orthopedic Surgery | Admitting: Orthopedic Surgery

## 2012-01-19 ENCOUNTER — Encounter (HOSPITAL_COMMUNITY): Payer: Self-pay

## 2012-01-19 ENCOUNTER — Encounter: Payer: Self-pay | Admitting: Orthopedic Surgery

## 2012-01-19 HISTORY — DX: Nausea with vomiting, unspecified: R11.2

## 2012-01-19 HISTORY — DX: Other specified postprocedural states: Z98.890

## 2012-01-19 LAB — BASIC METABOLIC PANEL
BUN: 14 mg/dL (ref 6–23)
Calcium: 9.6 mg/dL (ref 8.4–10.5)
Creatinine, Ser: 0.8 mg/dL (ref 0.50–1.10)
GFR calc Af Amer: >90 mL/min (ref >90)

## 2012-01-19 LAB — HEMOGLOBIN AND HEMATOCRIT, BLOOD
HCT: 37.6 % (ref 36.0–46.0)
Hemoglobin: 12.6 g/dL (ref 12.0–15.0)

## 2012-01-19 LAB — SURGICAL PCR SCREEN: MRSA, PCR: NEGATIVE

## 2012-01-25 NOTE — H&P (Signed)
Kylie Duffy is an 56 y.o. female.   Chief Complaint: LEFT KNEE PAIN  HPI: 56 YO INJURED SEVERAL MONTHS BACK. AFTER INITIAL EVAL HAD MRI WHICH WAS READ AS MEDIAL MENISCUS TEAR, BAKERS CYST, PTF DISEASE. MECHANICAL SYMPTOMS PERSISTED AND SHE DID NOT IMPROVE WITH NSAIDS, EXERCISE AND INJECTION. SHE IS LOOKING FOR PAIN RELIEF AND IMPROVED FUNCTION.  Past Medical History  Diagnosis Date  . PONV (postoperative nausea and vomiting)     Past Surgical History  Procedure Date  . Abdominal hysterectomy   . Tonsillectomy   . Diagnostic laparoscopy     No family history on file. Social History:  reports that she has never smoked. She does not have any smokeless tobacco history on file. She reports that she does not drink alcohol or use illicit drugs.  Allergies: No Known Allergies  No prescriptions prior to admission    No results found for this or any previous visit (from the past 48 hour(s)). No results found.  Review of Systems  Constitutional: Negative.   HENT: Negative.   Eyes: Negative.   Respiratory: Negative.   Cardiovascular: Negative.   Gastrointestinal: Negative.   Genitourinary: Negative.   Musculoskeletal: Positive for joint pain.  Skin: Negative.   Neurological: Negative.   Endo/Heme/Allergies: Negative.   Psychiatric/Behavioral:       Recent death of her father     Physical Exam  Constitutional: She is oriented to person, place, and time. She appears well-developed and well-nourished.  HENT:  Head: Microcephalic. Head is with raccoon's eyes and with Battle's sign.  Eyes: Pupils are equal, round, and reactive to light.  Neck: Normal range of motion.  Cardiovascular: Normal rate and intact distal pulses.   GI: Soft.  Musculoskeletal: Normal range of motion.  Neurological: She is alert and oriented to person, place, and time. She has normal reflexes.  Skin: Skin is warm and dry.  Psychiatric: She has a normal mood and affect. Her behavior is normal. Judgment and  thought content normal.  Upper extremity exam  The right and left upper extremity:   Inspection and palpation revealed no abnormalities in the upper extremities.   Range of motion is full without contracture.  Motor exam is normal with grade 5 strength.  The joints are fully reduced without subluxation.  There is no atrophy or tremor and muscle tone is normal.  All joints are stable.   RIGHT KNEE:  Inspection: No swelling no tenderness   ROM 135 degrees   Strength grade 5/5  Stability normal   Special tests   McMurrays normal   Screw Home normal   LEFT KNEE  MEDIAL JOINT LINE TENDERNESS NO EFFUSION MCMURRAYS SIGN +/- LIGAMENTS ARE STABLE  ROM IS NORMAL  MILD PTF PAIN TO PALPATION    Assessment/Plan MRI + MEDIAL MENISCUS ROOT TEAR -RADIAL PTF CHONDRAL DISEASE    PLAN SALK PARTIAL MEDIAL MENISECTOMY   Fuller Canada 01/25/2012, 8:04 PM

## 2012-01-26 ENCOUNTER — Encounter (HOSPITAL_COMMUNITY): Payer: Self-pay | Admitting: *Deleted

## 2012-01-26 ENCOUNTER — Encounter (HOSPITAL_COMMUNITY)
Admission: RE | Disposition: A | Discharge status: Home / Self Care | Payer: Self-pay | Source: Ambulatory Visit | Attending: Orthopedic Surgery

## 2012-01-26 ENCOUNTER — Ambulatory Visit (HOSPITAL_COMMUNITY)
Admission: RE | Admit: 2012-01-26 | Discharge: 2012-01-26 | Disposition: A | Discharge status: Home / Self Care | Payer: 59 | Source: Ambulatory Visit | Attending: Orthopedic Surgery | Admitting: Orthopedic Surgery

## 2012-01-26 ENCOUNTER — Encounter (HOSPITAL_COMMUNITY): Payer: Self-pay | Admitting: Anesthesiology

## 2012-01-26 ENCOUNTER — Ambulatory Visit (HOSPITAL_COMMUNITY): Payer: 59 | Admitting: Anesthesiology

## 2012-01-26 DIAGNOSIS — IMO0002 Reserved for concepts with insufficient information to code with codable children: Secondary | ICD-10-CM | POA: Insufficient documentation

## 2012-01-26 DIAGNOSIS — Z01812 Encounter for preprocedural laboratory examination: Secondary | ICD-10-CM | POA: Insufficient documentation

## 2012-01-26 DIAGNOSIS — S83249A Other tear of medial meniscus, current injury, unspecified knee, initial encounter: Secondary | ICD-10-CM

## 2012-01-26 DIAGNOSIS — X58XXXA Exposure to other specified factors, initial encounter: Secondary | ICD-10-CM | POA: Insufficient documentation

## 2012-01-26 HISTORY — PX: KNEE ARTHROSCOPY WITH MEDIAL MENISECTOMY: SHX5651

## 2012-01-26 SURGERY — ARTHROSCOPY, KNEE, WITH MEDIAL MENISCECTOMY
Anesthesia: General | Site: Knee | Laterality: Left | Wound class: Clean

## 2012-01-26 MED ORDER — FENTANYL CITRATE 0.05 MG/ML IJ SOLN
INTRAMUSCULAR | Status: AC
  Filled 2012-01-26: qty 2

## 2012-01-26 MED ORDER — EPINEPHRINE HCL 1 MG/ML IJ SOLN
INTRAMUSCULAR | Status: AC
  Filled 2012-01-26: qty 5

## 2012-01-26 MED ORDER — SODIUM CHLORIDE 0.9 % IR SOLN
Status: DC | PRN
  Administered 2012-01-26: 1

## 2012-01-26 MED ORDER — FENTANYL CITRATE 0.05 MG/ML IJ SOLN: 25.0000 ug | INTRAMUSCULAR | Status: DC | PRN

## 2012-01-26 MED ORDER — CEFAZOLIN SODIUM-DEXTROSE 2-3 GM-% IV SOLR
INTRAVENOUS | Status: DC | PRN
  Administered 2012-01-26: 2 g via INTRAVENOUS

## 2012-01-26 MED ORDER — SCOPOLAMINE 1 MG/3DAYS TD PT72
MEDICATED_PATCH | TRANSDERMAL | Status: AC
  Filled 2012-01-26: qty 1

## 2012-01-26 MED ORDER — HYDROCODONE-ACETAMINOPHEN 5-325 MG PO TABS: 1.0000 | ORAL_TABLET | Freq: Four times a day (QID) | ORAL | Status: DC | PRN

## 2012-01-26 MED ORDER — DEXAMETHASONE SODIUM PHOSPHATE 4 MG/ML IJ SOLN
4.0000 mg | Freq: Once | INTRAMUSCULAR | Status: AC
  Administered 2012-01-26: 4 mg via INTRAVENOUS

## 2012-01-26 MED ORDER — PROPOFOL 10 MG/ML IV BOLUS
INTRAVENOUS | Status: DC | PRN
  Administered 2012-01-26: 120 mg via INTRAVENOUS

## 2012-01-26 MED ORDER — KETOROLAC TROMETHAMINE 30 MG/ML IJ SOLN
30.0000 mg | Freq: Once | INTRAMUSCULAR | Status: AC
  Administered 2012-01-26: 30 mg via INTRAVENOUS

## 2012-01-26 MED ORDER — ONDANSETRON HCL 4 MG/2ML IJ SOLN: 4.0000 mg | Freq: Once | INTRAMUSCULAR | Status: DC | PRN

## 2012-01-26 MED ORDER — BUPIVACAINE-EPINEPHRINE PF 0.5-1:200000 % IJ SOLN
INTRAMUSCULAR | Status: AC
  Filled 2012-01-26: qty 20

## 2012-01-26 MED ORDER — MIDAZOLAM HCL 2 MG/2ML IJ SOLN
1.0000 mg | INTRAMUSCULAR | Status: DC | PRN
  Administered 2012-01-26: 2 mg via INTRAVENOUS

## 2012-01-26 MED ORDER — CEFAZOLIN SODIUM-DEXTROSE 2-3 GM-% IV SOLR: 2.0000 g | INTRAVENOUS | Status: DC

## 2012-01-26 MED ORDER — PROPOFOL 10 MG/ML IV EMUL
INTRAVENOUS | Status: AC
  Filled 2012-01-26: qty 20

## 2012-01-26 MED ORDER — ONDANSETRON HCL 4 MG/2ML IJ SOLN
INTRAMUSCULAR | Status: AC
  Filled 2012-01-26: qty 2

## 2012-01-26 MED ORDER — LIDOCAINE HCL 1 % IJ SOLN
INTRAMUSCULAR | Status: DC | PRN
  Administered 2012-01-26: 50 mg via INTRADERMAL

## 2012-01-26 MED ORDER — CEFAZOLIN SODIUM-DEXTROSE 2-3 GM-% IV SOLR
INTRAVENOUS | Status: AC
  Filled 2012-01-26: qty 50

## 2012-01-26 MED ORDER — HYDROCODONE-ACETAMINOPHEN 5-325 MG PO TABS
ORAL_TABLET | ORAL | Status: AC
  Filled 2012-01-26: qty 1

## 2012-01-26 MED ORDER — FENTANYL CITRATE 0.05 MG/ML IJ SOLN
INTRAMUSCULAR | Status: DC | PRN
  Administered 2012-01-26 (×2): 50 ug via INTRAVENOUS

## 2012-01-26 MED ORDER — ONDANSETRON HCL 4 MG/2ML IJ SOLN
4.0000 mg | Freq: Once | INTRAMUSCULAR | Status: AC
  Administered 2012-01-26: 4 mg via INTRAVENOUS

## 2012-01-26 MED ORDER — PROMETHAZINE HCL 12.5 MG PO TABS: 12.5000 mg | ORAL_TABLET | Freq: Four times a day (QID) | ORAL | Status: DC | PRN

## 2012-01-26 MED ORDER — SCOPOLAMINE 1 MG/3DAYS TD PT72
1.0000 | MEDICATED_PATCH | Freq: Once | TRANSDERMAL | Status: DC
  Administered 2012-01-26: 1.5 mg via TRANSDERMAL

## 2012-01-26 MED ORDER — MIDAZOLAM HCL 2 MG/2ML IJ SOLN
INTRAMUSCULAR | Status: AC
  Filled 2012-01-26: qty 2

## 2012-01-26 MED ORDER — HYDROCODONE-ACETAMINOPHEN 5-325 MG PO TABS
1.0000 | ORAL_TABLET | Freq: Once | ORAL | Status: AC
  Administered 2012-01-26: 1 via ORAL

## 2012-01-26 MED ORDER — BUPIVACAINE-EPINEPHRINE PF 0.5-1:200000 % IJ SOLN
INTRAMUSCULAR | Status: DC | PRN
  Administered 2012-01-26: 60 mL

## 2012-01-26 MED ORDER — MIDAZOLAM HCL 5 MG/5ML IJ SOLN
INTRAMUSCULAR | Status: DC | PRN
  Administered 2012-01-26: 2 mg via INTRAVENOUS

## 2012-01-26 MED ORDER — LIDOCAINE HCL (PF) 1 % IJ SOLN
INTRAMUSCULAR | Status: AC
  Filled 2012-01-26: qty 5

## 2012-01-26 MED ORDER — DEXAMETHASONE SODIUM PHOSPHATE 4 MG/ML IJ SOLN
INTRAMUSCULAR | Status: AC
  Filled 2012-01-26: qty 1

## 2012-01-26 MED ORDER — LACTATED RINGERS IV SOLN
INTRAVENOUS | Status: DC
Start: 2012-01-26 — End: 2012-01-26
  Administered 2012-01-26: 1000 mL via INTRAVENOUS

## 2012-01-26 MED ORDER — SODIUM CHLORIDE 0.9 % IR SOLN
Status: DC | PRN
  Administered 2012-01-26 (×3)

## 2012-01-26 MED ORDER — CHLORHEXIDINE GLUCONATE 4 % EX LIQD: 60.0000 mL | Freq: Once | CUTANEOUS | Status: DC

## 2012-01-26 MED ORDER — KETOROLAC TROMETHAMINE 30 MG/ML IJ SOLN
INTRAMUSCULAR | Status: AC
  Filled 2012-01-26: qty 1

## 2012-01-26 MED ORDER — METHYLPREDNISOLONE ACETATE 40 MG/ML IJ SUSP
INTRAMUSCULAR | Status: AC
  Filled 2012-01-26: qty 5

## 2012-01-26 SURGICAL SUPPLY — 51 items
ARTHROWAND PARAGON T2 (SURGICAL WAND)
BAG HAMPER (MISCELLANEOUS) ×2 IMPLANT
BANDAGE ELASTIC 6 VELCRO NS (GAUZE/BANDAGES/DRESSINGS) ×2 IMPLANT
BLADE AGGRESSIVE PLUS 4.0 (BLADE) ×2 IMPLANT
BLADE SURG SZ11 CARB STEEL (BLADE) ×2 IMPLANT
CHLORAPREP W/TINT 26ML (MISCELLANEOUS) ×4 IMPLANT
CLOTH BEACON ORANGE TIMEOUT ST (SAFETY) ×2 IMPLANT
COOLER CRYO IC GRAV AND TUBE (ORTHOPEDIC SUPPLIES) ×2 IMPLANT
CUFF CRYO KNEE LG 20X31 COOLER (ORTHOPEDIC SUPPLIES) IMPLANT
CUFF CRYO KNEE18X23 MED (MISCELLANEOUS) ×2 IMPLANT
CUFF TOURNIQUET SINGLE 34IN LL (TOURNIQUET CUFF) ×2 IMPLANT
CUFF TOURNIQUET SINGLE 44IN (TOURNIQUET CUFF) IMPLANT
CUTTER ANGLED DBL BITE 4.5 (BURR) IMPLANT
DECANTER SPIKE VIAL GLASS SM (MISCELLANEOUS) ×4 IMPLANT
GAUZE SPONGE 4X4 16PLY XRAY LF (GAUZE/BANDAGES/DRESSINGS) ×2 IMPLANT
GAUZE XEROFORM 5X9 LF (GAUZE/BANDAGES/DRESSINGS) ×2 IMPLANT
GLOVE BIOGEL PI IND STRL 7.0 (GLOVE) ×2 IMPLANT
GLOVE BIOGEL PI INDICATOR 7.0 (GLOVE) ×2
GLOVE EXAM NITRILE MD LF STRL (GLOVE) ×4 IMPLANT
GLOVE SKINSENSE NS SZ8.0 LF (GLOVE) ×1
GLOVE SKINSENSE STRL SZ8.0 LF (GLOVE) ×1 IMPLANT
GLOVE SS BIOGEL STRL SZ 6.5 (GLOVE) ×2 IMPLANT
GLOVE SS N UNI LF 8.5 STRL (GLOVE) ×2 IMPLANT
GLOVE SUPERSENSE BIOGEL SZ 6.5 (GLOVE) ×2
GOWN STRL REIN XL XLG (GOWN DISPOSABLE) ×6 IMPLANT
HLDR LEG FOAM (MISCELLANEOUS) ×1 IMPLANT
IV NS IRRIG 3000ML ARTHROMATIC (IV SOLUTION) ×6 IMPLANT
KIT BLADEGUARD II DBL (SET/KITS/TRAYS/PACK) ×2 IMPLANT
KIT ROOM TURNOVER AP CYSTO (KITS) ×2 IMPLANT
LEG HOLDER FOAM (MISCELLANEOUS) ×1
MANIFOLD NEPTUNE II (INSTRUMENTS) ×2 IMPLANT
MARKER SKIN DUAL TIP RULER LAB (MISCELLANEOUS) ×2 IMPLANT
NEEDLE HYPO 18GX1.5 BLUNT FILL (NEEDLE) ×2 IMPLANT
NEEDLE HYPO 21X1.5 SAFETY (NEEDLE) ×2 IMPLANT
NEEDLE SPNL 18GX3.5 QUINCKE PK (NEEDLE) ×2 IMPLANT
NS IRRIG 1000ML POUR BTL (IV SOLUTION) ×2 IMPLANT
PACK ARTHRO LIMB DRAPE STRL (MISCELLANEOUS) ×2 IMPLANT
PAD ABD 5X9 TENDERSORB (GAUZE/BANDAGES/DRESSINGS) ×2 IMPLANT
PAD ARMBOARD 7.5X6 YLW CONV (MISCELLANEOUS) ×2 IMPLANT
PADDING CAST COTTON 6X4 STRL (CAST SUPPLIES) ×2 IMPLANT
SET ARTHROSCOPY INST (INSTRUMENTS) ×2 IMPLANT
SET ARTHROSCOPY PUMP TUBE (IRRIGATION / IRRIGATOR) ×2 IMPLANT
SET BASIN LINEN APH (SET/KITS/TRAYS/PACK) ×2 IMPLANT
SPONGE GAUZE 4X4 12PLY (GAUZE/BANDAGES/DRESSINGS) ×2 IMPLANT
SUT ETHILON 3 0 FSL (SUTURE) IMPLANT
SYR 30ML LL (SYRINGE) ×2 IMPLANT
SYRINGE 10CC LL (SYRINGE) ×2 IMPLANT
WAND 50 DEG COVAC W/CORD (SURGICAL WAND) IMPLANT
WAND 90 DEG TURBOVAC W/CORD (SURGICAL WAND) IMPLANT
WAND ARTHRO PARAGON T2 (SURGICAL WAND) IMPLANT
YANKAUER SUCT BULB TIP 10FT TU (MISCELLANEOUS) ×6 IMPLANT

## 2012-01-26 NOTE — Anesthesia Preprocedure Evaluation (Signed)
Anesthesia Evaluation  Patient identified by MRN, date of birth, ID band Patient awake    Reviewed: Allergy & Precautions, H&P , NPO status , Patient's Chart, lab work & pertinent test results  History of Anesthesia Complications (+) PONV  Airway Mallampati: II      Dental  (+) Teeth Intact   Pulmonary neg pulmonary ROS,  breath sounds clear to auscultation        Cardiovascular negative cardio ROS  Rhythm:Regular Rate:Normal     Neuro/Psych    GI/Hepatic negative GI ROS,   Endo/Other    Renal/GU      Musculoskeletal   Abdominal   Peds  Hematology   Anesthesia Other Findings   Reproductive/Obstetrics                           Anesthesia Physical Anesthesia Plan  ASA: I  Anesthesia Plan: General   Post-op Pain Management:    Induction: Intravenous  Airway Management Planned: LMA  Additional Equipment:   Intra-op Plan:   Post-operative Plan: Extubation in OR  Informed Consent: I have reviewed the patients History and Physical, chart, labs and discussed the procedure including the risks, benefits and alternatives for the proposed anesthesia with the patient or authorized representative who has indicated his/her understanding and acceptance.     Plan Discussed with:   Anesthesia Plan Comments:         Anesthesia Quick Evaluation

## 2012-01-26 NOTE — Op Note (Signed)
01/26/2012  8:25 AM  PATIENT:  Kylie Duffy  56 y.o. female  PRE-OPERATIVE DIAGNOSIS:  medial meniscus tear left knee  POST-OPERATIVE DIAGNOSIS:  medial meniscus tear left knee, arthritis medial compartment  Operative findings. #1 torn medial meniscus at the root. Arthritis medial compartment. Chondromalacia patella.  PROCEDURE:  Procedure(s) (LRB) with comments: KNEE ARTHROSCOPY WITH MEDIAL MENISECTOMY (Left)  SURGEON:  Surgeon(s) and Role:    * Vickki Hearing, MD - Primary  PHYSICIAN ASSISTANT:   ASSISTANTS: none   ANESTHESIA:   general  EBL:  Total I/O In: 500 [I.V.:500] Out: -   BLOOD ADMINISTERED:none  DRAINS: none   LOCAL MEDICATIONS USED:  MARCAINE   , Amount: 60 ml and OTHER epi  SPECIMEN:  No Specimen  DISPOSITION OF SPECIMEN:  N/A  COUNTS:  YES  TOURNIQUET:  * Missing tourniquet times found for documented tourniquets in log:  16109 *  DICTATION: .Dragon Dictation  PLAN OF CARE: Discharge to home after PACU  PATIENT DISPOSITION:  PACU - hemodynamically stable.   Delay start of Pharmacological VTE agent (>24hrs) due to surgical blood loss or risk of bleeding: not applicable  Details of procedure The patient was seen in the preop area the left knee was confirmed as a surgical site. It was signed. The chart was updated. The patient was taken to the operating room. She was given a weight appropriate dose of antibiotic. She had general anesthesia. No complications noted at that time. In the supine position the left leg was placed in a arthroscopic leg holder and the right leg was placed in a well leg holder with padding.  After sterile prep and drape and after timeout standard arthroscopy was performed  Medial and lateral portals were established the scope was placed into the joint into the suprapatellar pouch. A diagnostic arthroscopy was performed using a probe to analyze and evaluate the intra-articular structures. The findings are listed above. A  fissure was noted in the median ridge of the patella but it did not contact the trochlea as it had adequate cartilage on either side. There was a mild chondromalacia grade 1 of the trochlea and grade 2 of the medial femoral condyle. There was a tear of the posterior horn of the medial meniscus and it was a repair. It looked like the meniscal edge had been torn and had smoothed out over time however the root was displaceable into the joint. We did a meniscectomy by recessing the posterior horn of the medial meniscus with a 50 ArthroCare wand. We did a debridement of the synovial tissue for better visualization of the patellofemoral joint. No chondral resection was performed in the patellofemoral area secondary to adequate articulation at the area of the fissure  After irrigating the joint the portals were closed with 3-0 nylon suture placing 2 sutures in each portal. We then injected the knee with Marcaine and epinephrine  Stay sterile dressing was applied followed by a Cryo/Cuff which was activated. The patient was extubated and taken to the recovery room in stable condition  Therapy can begin in 48 hours.

## 2012-01-26 NOTE — Transfer of Care (Signed)
Immediate Anesthesia Transfer of Care Note  Patient: Kylie Duffy  Procedure(s) Performed: Procedure(s) (LRB) with comments: KNEE ARTHROSCOPY WITH MEDIAL MENISECTOMY (Left)  Patient Location: PACU  Anesthesia Type:General  Level of Consciousness: awake and patient cooperative  Airway & Oxygen Therapy: Patient Spontanous Breathing and Patient connected to face mask oxygen  Post-op Assessment: Report given to PACU RN, Post -op Vital signs reviewed and stable and Patient moving all extremities  Post vital signs: Reviewed and stable  Complications: No apparent anesthesia complications

## 2012-01-26 NOTE — Interval H&P Note (Signed)
History and Physical Interval Note:  01/26/2012 7:21 AM  Kylie Duffy  has presented today for surgery, with the diagnosis of medial meniscus tear left knee  The various methods of treatment have been discussed with the patient and family. After consideration of risks, benefits and other options for treatment, the patient has consented to  Procedure(s) (LRB) with comments: KNEE ARTHROSCOPY WITH MEDIAL MENISECTOMY (Left) as a surgical intervention .  The patient's history has been reviewed, patient examined, no change in status, stable for surgery.  I have reviewed the patient's chart and labs.  Questions were answered to the patient's satisfaction.     Fuller Canada SALK MED MENISECT

## 2012-01-26 NOTE — Brief Op Note (Signed)
01/26/2012  8:25 AM  PATIENT:  Kylie Duffy  56 y.o. female  PRE-OPERATIVE DIAGNOSIS:  medial meniscus tear left knee  POST-OPERATIVE DIAGNOSIS:  medial meniscus tear left knee, arthritis medial compartment  Operative findings. #1 torn medial meniscus at the root. Arthritis medial compartment. Chondromalacia patella.  PROCEDURE:  Procedure(s) (LRB) with comments: KNEE ARTHROSCOPY WITH MEDIAL MENISECTOMY (Left)  SURGEON:  Surgeon(s) and Role:    * Stanley E Harrison, MD - Primary  PHYSICIAN ASSISTANT:   ASSISTANTS: none   ANESTHESIA:   general  EBL:  Total I/O In: 500 [I.V.:500] Out: -   BLOOD ADMINISTERED:none  DRAINS: none   LOCAL MEDICATIONS USED:  MARCAINE   , Amount: 60 ml and OTHER epi  SPECIMEN:  No Specimen  DISPOSITION OF SPECIMEN:  N/A  COUNTS:  YES  TOURNIQUET:  * Missing tourniquet times found for documented tourniquets in log:  [id number] *  DICTATION: .Dragon Dictation  PLAN OF CARE: Discharge to home after PACU  PATIENT DISPOSITION:  PACU - hemodynamically stable.   Delay start of Pharmacological VTE agent (>24hrs) due to surgical blood loss or risk of bleeding: not applicable  Details of procedure The patient was seen in the preop area the left knee was confirmed as a surgical site. It was signed. The chart was updated. The patient was taken to the operating room. She was given a weight appropriate dose of antibiotic. She had general anesthesia. No complications noted at that time. In the supine position the left leg was placed in a arthroscopic leg holder and the right leg was placed in a well leg holder with padding.  After sterile prep and drape and after timeout standard arthroscopy was performed  Medial and lateral portals were established the scope was placed into the joint into the suprapatellar pouch. A diagnostic arthroscopy was performed using a probe to analyze and evaluate the intra-articular structures. The findings are listed above. A  fissure was noted in the median ridge of the patella but it did not contact the trochlea as it had adequate cartilage on either side. There was a mild chondromalacia grade 1 of the trochlea and grade 2 of the medial femoral condyle. There was a tear of the posterior horn of the medial meniscus and it was a repair. It looked like the meniscal edge had been torn and had smoothed out over time however the root was displaceable into the joint. We did a meniscectomy by recessing the posterior horn of the medial meniscus with a 50 ArthroCare wand. We did a debridement of the synovial tissue for better visualization of the patellofemoral joint. No chondral resection was performed in the patellofemoral area secondary to adequate articulation at the area of the fissure  After irrigating the joint the portals were closed with 3-0 nylon suture placing 2 sutures in each portal. We then injected the knee with Marcaine and epinephrine  Stay sterile dressing was applied followed by a Cryo/Cuff which was activated. The patient was extubated and taken to the recovery room in stable condition  Therapy can begin in 48 hours.  

## 2012-01-26 NOTE — Anesthesia Postprocedure Evaluation (Signed)
  Anesthesia Post-op Note  Patient: Kylie Duffy  Procedure(s) Performed: Procedure(s) (LRB) with comments: KNEE ARTHROSCOPY WITH MEDIAL MENISECTOMY (Left)  Patient Location: PACU  Anesthesia Type:General  Level of Consciousness: awake, alert , oriented and patient cooperative  Airway and Oxygen Therapy: Patient Spontanous Breathing  Post-op Pain: none  Post-op Assessment: Post-op Vital signs reviewed, Patient's Cardiovascular Status Stable, Respiratory Function Stable, Patent Airway, No signs of Nausea or vomiting and Pain level controlled  Post-op Vital Signs: Reviewed and stable  Complications: No apparent anesthesia complications

## 2012-01-26 NOTE — Anesthesia Procedure Notes (Signed)
Procedure Name: LMA Insertion Date/Time: 01/26/2012 7:40 AM Performed by: Despina Hidden Pre-anesthesia Checklist: Patient identified, Emergency Drugs available, Suction available and Patient being monitored Patient Re-evaluated:Patient Re-evaluated prior to inductionOxygen Delivery Method: Circle system utilized Preoxygenation: Pre-oxygenation with 100% oxygen Intubation Type: IV induction Ventilation: Mask ventilation without difficulty LMA: LMA inserted LMA Size: 3.0 Tube type: Oral Placement Confirmation: positive ETCO2 and breath sounds checked- equal and bilateral Tube secured with: Tape Dental Injury: Teeth and Oropharynx as per pre-operative assessment

## 2012-01-29 ENCOUNTER — Ambulatory Visit (INDEPENDENT_AMBULATORY_CARE_PROVIDER_SITE_OTHER): Payer: 59 | Admitting: Orthopedic Surgery

## 2012-01-29 ENCOUNTER — Encounter: Payer: Self-pay | Admitting: Orthopedic Surgery

## 2012-01-29 VITALS — BP 104/72 | Ht 63.0 in | Wt 124.0 lb

## 2012-01-29 DIAGNOSIS — M2240 Chondromalacia patellae, unspecified knee: Secondary | ICD-10-CM

## 2012-01-29 DIAGNOSIS — M2242 Chondromalacia patellae, left knee: Secondary | ICD-10-CM

## 2012-01-29 DIAGNOSIS — Z9889 Other specified postprocedural states: Secondary | ICD-10-CM | POA: Insufficient documentation

## 2012-01-29 DIAGNOSIS — M23329 Other meniscus derangements, posterior horn of medial meniscus, unspecified knee: Secondary | ICD-10-CM

## 2012-01-29 NOTE — Patient Instructions (Addendum)
Please call the hospital to START PHYSICAL THERAPY   RTW Monday

## 2012-01-29 NOTE — Progress Notes (Signed)
Patient ID: Kylie Duffy, female   DOB: 1955-10-23, 57 y.o.   MRN: [id number] Chief Complaint  Patient presents with  . Follow-up    Post op #1, SALK. DOS 01-26-12.   PRE-OPERATIVE DIAGNOSIS: medial meniscus tear left knee  POST-OPERATIVE DIAGNOSIS: medial meniscus tear left knee, arthritis medial compartment  Operative findings. #1 torn medial meniscus at the root. Arthritis medial compartment. Chondromalacia patella.  PROCEDURE: Procedure(s) (LRB) with comments:  KNEE ARTHROSCOPY WITH MEDIAL MENISECTOMY (Left)  Kylie Duffy is weightbearing partially with crutches. She started her knee bending exercises her flexion is about 75. She also shows some extension loss. Her wounds look good she has a bruise at the inferior portion of the patella femoral area  swelling no calf tenderness normal dorsiflexion of the foot.  Stitches removed.   OK TO START PT

## 2012-01-30 ENCOUNTER — Encounter (HOSPITAL_COMMUNITY): Payer: Self-pay | Admitting: Orthopedic Surgery

## 2012-01-30 ENCOUNTER — Ambulatory Visit (HOSPITAL_COMMUNITY)
Admission: RE | Admit: 2012-01-30 | Discharge: 2012-01-30 | Disposition: A | Discharge status: Home / Self Care | Payer: 59 | Source: Ambulatory Visit | Attending: Orthopedic Surgery | Admitting: Orthopedic Surgery

## 2012-01-30 DIAGNOSIS — M25569 Pain in unspecified knee: Secondary | ICD-10-CM | POA: Diagnosis not present

## 2012-01-30 DIAGNOSIS — IMO0001 Reserved for inherently not codable concepts without codable children: Secondary | ICD-10-CM | POA: Insufficient documentation

## 2012-01-30 DIAGNOSIS — M25469 Effusion, unspecified knee: Secondary | ICD-10-CM | POA: Diagnosis not present

## 2012-01-30 DIAGNOSIS — R262 Difficulty in walking, not elsewhere classified: Secondary | ICD-10-CM | POA: Insufficient documentation

## 2012-01-30 DIAGNOSIS — M6281 Muscle weakness (generalized): Secondary | ICD-10-CM | POA: Diagnosis not present

## 2012-01-30 NOTE — Evaluation (Signed)
Physical Therapy Evaluation  Patient Details  Name: Kylie Duffy MRN: [id number] Date of Birth: 1955-07-28  Today's Date: 01/30/2012 Time: 1353-1440 PT Time Calculation (min): 47 min  Visit#: 1  of 9   Re-eval: 02/20/12 Assessment Diagnosis: L arthroscopic surgery Surgical Date: 01/26/12 Prior Therapy: none  Authorization: UMR   Past Medical History:  Past Medical History  Diagnosis Date  . PONV (postoperative nausea and vomiting)    Past Surgical History:  Past Surgical History  Procedure Date  . Abdominal hysterectomy   . Tonsillectomy   . Diagnostic laparoscopy   . Knee arthroscopy with medial menisectomy 01/26/2012    Procedure: KNEE ARTHROSCOPY WITH MEDIAL MENISECTOMY;  Surgeon: Vickki Hearing, MD;  Location: [facility];  Service: Orthopedics;  Laterality: Left;    Subjective Symptoms/Limitations Symptoms: [name] states that she had arthroscopic surgery on her L knee on 01/26/12.  She states that she is still having difficulty with bending her knee and feels like her strength is not where is should be at this point.  She is being referred to physical therapy to return her to her prior funcitonal  level. How long can you sit comfortably?: able to sit for 20-30 minutes How long can you stand comfortably?: able to stand for 15-20 minutes How long can you walk comfortably?: only walking short distances at home. Pain Assessment Currently in Pain?: Yes Pain Score:   5 (icing 3x/day.) Pain Location: Knee Pain Onset: 1 to 4 weeks ago  Prior Functioning  Home Living Lives With: Family Type of Home: House Prior Function Vocation: Full time employment Vocation Requirements: sit/stand Leisure: Hobbies-yes (Comment) Comments: yoga; walking    Assessment LLE AROM (degrees) Left Knee Extension: 3  Left Knee Flexion: 110  LLE Strength Left Hip Flexion: 5/5 Left Hip Extension: 3+/5 Left Hip ABduction: 5/5 Left Hip ADduction: 3+/5 Left Knee Flexion:  (4-/5) Left  Knee Extension: 4/5 Left Ankle Dorsiflexion: 3+/5  Exercise/Treatments Seated Long Arc Quad: 10 reps Other Seated Knee Exercises: DF/PF x 10 Supine Quad Sets: 10 reps Heel Slides: 10 reps Sidelying Hip ADduction: 10 reps Prone  Hamstring Curl: 10 reps Hip Extension: 10 reps   Manual Therapy Manual Therapy: Edema management Edema Management: retromassage  Physical Therapy Assessment and Plan PT Assessment and Plan Clinical Impression Statement: Pt with decreased ROM, decreased strength and increased pain following arthroscopic surgery who will benefit from skilled PT to decrease pain and return pt to prior functional level Pt will benefit from skilled therapeutic intervention in order to improve on the following deficits: Decreased activity tolerance;Decreased strength;Difficulty walking;Pain Rehab Potential: Good PT Frequency: Min 2X/week PT Duration: 4 weeks PT Plan: begin TM for gt training; lateral and forward step ups, rockerboard, standing knee flex and terminal extension; add 3# to LAQ and prone ham curl.  Progress as needed    Goals Home Exercise Program Pt will Perform Home Exercise Program: Independently PT Short Term Goals Time to Complete Short Term Goals: 4 weeks PT Short Term Goal 1: ROM 0 to 120 to allow normalized walking PT Short Term Goal 2: Pain decreased by 3 levels PT Long Term Goals Time to Complete Long Term Goals:  (3 weeks) PT Long Term Goal 1: Pt pain to be no greater than a 1 80% of the day PT Long Term Goal 2: Pt able to walk for an hour withour any discomfort Long Term Goal 3: Pt to be able to sit for two hours without discomfort to be able to travel  or go to the movies  Problem List Patient Active Problem List  Diagnosis  . S/P arthroscopy of left knee  . Medial meniscus, posterior horn derangement  . Chondromalacia of patella, left    PT - End of Session Activity Tolerance: Patient tolerated treatment well General Behavior During  Session: [facility] for tasks performed Cognition: [facility] for tasks performed PT Plan of Care PT Home Exercise Plan: given  GP    Dorisann Schwanke,CINDY 01/30/2012, 4:57 PM  Physician Documentation Your signature is required to indicate approval of the treatment plan as stated above.  Please sign and either send electronically or make a copy of this report for your files and return this physician signed original.   Please mark one 1.__approve of plan  2. ___approve of plan with the following conditions.   ______________________________                                                          _____________________ Physician Signature                                                                                                             Date

## 2012-02-02 ENCOUNTER — Ambulatory Visit (HOSPITAL_COMMUNITY): Payer: 59 | Admitting: Physical Therapy

## 2012-02-03 ENCOUNTER — Ambulatory Visit (HOSPITAL_COMMUNITY)
Admission: RE | Admit: 2012-02-03 | Discharge: 2012-02-03 | Disposition: A | Discharge status: Home / Self Care | Payer: 59 | Source: Ambulatory Visit | Attending: Orthopedic Surgery | Admitting: Orthopedic Surgery

## 2012-02-03 DIAGNOSIS — IMO0001 Reserved for inherently not codable concepts without codable children: Secondary | ICD-10-CM | POA: Diagnosis not present

## 2012-02-03 NOTE — Progress Notes (Signed)
Physical Therapy Treatment Patient Details  Name: Kylie Duffy MRN: [id number] Date of Birth: 28-May-1955  Today's Date: 02/03/2012 Time: 9562-1308 PT Time Calculation (min): 45 min  Visit#: 2  of 9   Re-eval: 02/20/12 Charges: Therex x 32' Gait x 8'  Authorization: UMR     Subjective: Symptoms/Limitations Symptoms: Pt reports HEP compliance. Pain Assessment Currently in Pain?: Yes Pain Score:   5 Pain Location: Knee Pain Orientation: Left   Exercise/Treatments Aerobic Tread Mill: Gait training LL 79cm x8' to improve stride length, heel toe pattern and knee flexion Standing Heel Raises: 10 reps;Limitations Heel Raises Limitations: toe raises x 10 Lateral Step Up: 10 reps;Left;Hand Hold: 2 Forward Step Up: 10 reps;Left;Hand Hold: 0 Functional Squat: 10 reps Rocker Board: 2 minutes Seated Long Arc Quad: 15 reps Supine Quad Sets: 10 reps Straight Leg Raises: 10 reps;Left Sidelying Hip ADduction: 10 reps Prone  Hamstring Curl: 10 reps Hip Extension: 10 reps   Physical Therapy Assessment and Plan PT Assessment and Plan Clinical Impression Statement: Pt completes therex well after initial cueing and demonstration. Pt requires multimodal cueing to avoid shifting wt to R with functional squats. Pt's chief complaint is pain in L posterior knee with knee flexion and eccentric hamstring contraction. PT Plan: Continue to progress per PT POC. Add wt to mat exercises next session.     Problem List Patient Active Problem List  Diagnosis  . S/P arthroscopy of left knee  . Medial meniscus, posterior horn derangement  . Chondromalacia of patella, left    PT - End of Session Activity Tolerance: Patient tolerated treatment well General Behavior During Session: [facility] for tasks performed Cognition: [facility] for tasks performed  Seth Bake, PTA 02/03/2012, 3:40 PM

## 2012-02-05 ENCOUNTER — Ambulatory Visit (HOSPITAL_COMMUNITY)
Admission: RE | Admit: 2012-02-05 | Discharge: 2012-02-05 | Disposition: A | Discharge status: Home / Self Care | Payer: 59 | Source: Ambulatory Visit | Attending: Orthopedic Surgery | Admitting: Orthopedic Surgery

## 2012-02-05 DIAGNOSIS — IMO0001 Reserved for inherently not codable concepts without codable children: Secondary | ICD-10-CM | POA: Diagnosis not present

## 2012-02-05 NOTE — Progress Notes (Signed)
Physical Therapy Treatment Patient Details  Name: Kylie Duffy MRN: [id number] Date of Birth: October 21, 1955  Today's Date: 02/05/2012 Time: 1352- 1440 Time 48 minutes Charges: 1 ice 25' TE, 13' manual   Visit#: 3  of 9   Re-eval: 02/20/12    Authorization: UMR  Authorization Time Period:    Authorization Visit#:   of     Subjective: Symptoms/Limitations Symptoms: Pt reports that her knee is still sore and painful.  Pain Assessment Currently in Pain?: Yes Pain Score:   5 Pain Location: Knee Pain Orientation: Left Pain Type: Acute pain  Precautions/Restrictions     Exercise/Treatments Aerobic Tread Mill: Gait training LL 79cm 0.55 cyc/sec x5' to improve stride length, heel toe pattern and knee flexion Standing Lateral Step Up: Left;Hand Hold: 0;Step Height: 4";10 reps Forward Step Up: Left;15 reps;Hand Hold: 0;Step Height: 4" Step Down: Left;10 reps;Hand Hold: 0;Step Height: 4" Rocker Board: 2 minutes Other Standing Knee Exercises: downward facing dog into serpant pose 3x30 sec each; leg cross with trunk rotation 1x30 sec bilateral rotation; R leg ext w/L knee bent over R knee w/trunk rotation 2x30 sec w/instruction on extending L knee.   Manual Therapy Manual Therapy: Joint mobilization Edema Management: Retro massage Joint Mobilization: Grade II-IV PA and AP to L fibular head to decrease pain and improve mobility.  Cryotherapy 10 minutes L knee   Physical Therapy Assessment and Plan PT Assessment and Plan Clinical Impression Statement: Pt improved gait mechanics at end of TM session today and reports decreased stiffness. Added yoga activities to improve motor coordination for knee flexion and extension.  Pt continues to have some fear of placing weight on L knee due to pain.  Continues to have complaints of knee snapping with knee flexion.  PT Plan: Continue to progress strength and ROM to reach functional goals.     Goals Home Exercise Program Pt will Perform Home  Exercise Program: Independently PT Goal: Perform Home Exercise Program - Progress: Met PT Short Term Goals Time to Complete Short Term Goals: 4 weeks (10 days) PT Short Term Goal 1: ROM 0 to 120 to allow normalized walking PT Short Term Goal 1 - Progress: Progressing toward goal PT Short Term Goal 2: Pain decreased by 3 levels PT Short Term Goal 2 - Progress: Progressing toward goal PT Long Term Goals Time to Complete Long Term Goals:  (3 weeks) PT Long Term Goal 1: Pt pain to be no greater than a 1 80% of the day PT Long Term Goal 1 - Progress: Progressing toward goal PT Long Term Goal 2: Pt able to walk for an hour withour any discomfort PT Long Term Goal 2 - Progress: Progressing toward goal Long Term Goal 3: Pt to be able to sit for two hours without discomfort to be able to travel or go to the movies Long Term Goal 3 Progress: Progressing toward goal  Problem List Patient Active Problem List  Diagnosis  . S/P arthroscopy of left knee  . Medial meniscus, posterior horn derangement  . Chondromalacia of patella, left    PT - End of Session Activity Tolerance: Patient tolerated treatment well General Behavior During Session: [facility] for tasks performed Cognition: [facility] for tasks performed PT Plan of Care PT Patient Instructions: Discussed incorporating yoga activities to her exercises and focus on improving knee flexion and extension.  Discussed using appropriate props when needed to decrease pain and to continue with deep breathing activtiies.  Consulted and Agree with Plan of Care: Patient  Janelly Switalski  Malaquias Lenker, PT 02/05/2012, 2:38 PM

## 2012-02-10 ENCOUNTER — Ambulatory Visit (HOSPITAL_COMMUNITY)
Admission: RE | Admit: 2012-02-10 | Discharge: 2012-02-10 | Disposition: A | Discharge status: Home / Self Care | Payer: 59 | Source: Ambulatory Visit | Attending: *Deleted | Admitting: *Deleted

## 2012-02-10 DIAGNOSIS — IMO0001 Reserved for inherently not codable concepts without codable children: Secondary | ICD-10-CM | POA: Diagnosis not present

## 2012-02-10 NOTE — Progress Notes (Signed)
Physical Therapy Treatment Patient Details  Name: Kylie Duffy MRN: [id number] Date of Birth: 1955-03-03  Today's Date: 02/10/2012 Time: 7829-5621 PT Time Calculation (min): 42 min  Visit#: 4  of 9   Re-eval: 02/20/12 Charges: Gait x 8' Therex x 30'  Authorization: UMR    Subjective: Symptoms/Limitations Symptoms: Pt states that her pain is seems to be decreasing. Pain Assessment Currently in Pain?: Yes Pain Score:   4 Pain Location: Knee Pain Orientation: Left   Exercise/Treatments Aerobic Tread Mill: Gait training LL 79cm 0.55 cyc/sec x8' to improve stride length, heel toe pattern and knee flexion Standing Lateral Step Up: 15 reps;Step Height: 4";Left;Hand Hold: 0 Forward Step Up: Left;15 reps;Hand Hold: 0;Step Height: 4" Step Down: Left;10 reps;Hand Hold: 0;Step Height: 4" Rocker Board: 2 minutes Other Standing Knee Exercises: downward facing dog into serpant pose 3x30 sec each; leg cross with trunk rotation 1x30 sec bilateral rotation; R leg ext w/L knee bent over R knee w/trunk rotation 1x30 sec w/instruction on extending L knee.  Other Standing Knee Exercises: Warrior I 2x30" Sidelying Other Sidelying Knee Exercises: Sit and reach hamstring stretch 2x30"  Physical Therapy Assessment and Plan PT Assessment and Plan Clinical Impression Statement: Pt presents with imrproved gait mechanics and and stability this session. Continued yoga activities ro improve balance and mm control. Pt denies need ofr ice and states that she will ice knee at home. PT Plan: Continue to progress strength and ROM to reach functional goals.      Problem List Patient Active Problem List  Diagnosis  . S/P arthroscopy of left knee  . Medial meniscus, posterior horn derangement  . Chondromalacia of patella, left    PT - End of Session Activity Tolerance: Patient tolerated treatment well General Behavior During Session: [facility] for tasks performed Cognition: [facility] for tasks  performed  Seth Bake, PTA 02/10/2012, 5:33 PM

## 2012-02-12 ENCOUNTER — Ambulatory Visit (HOSPITAL_COMMUNITY)
Admission: RE | Admit: 2012-02-12 | Discharge: 2012-02-12 | Disposition: A | Discharge status: Home / Self Care | Payer: 59 | Source: Ambulatory Visit | Attending: Orthopedic Surgery | Admitting: Orthopedic Surgery

## 2012-02-12 DIAGNOSIS — IMO0001 Reserved for inherently not codable concepts without codable children: Secondary | ICD-10-CM | POA: Diagnosis not present

## 2012-02-12 NOTE — Progress Notes (Signed)
Physical Therapy Treatment Patient Details  Name: Kylie Duffy MRN: [id number] Date of Birth: January 17, 1956  Today's Date: 02/12/2012 Time: 4098-1191 PT Time Calculation (min): 41 min Visit#: 5  of 9   Re-eval: 02/20/12 Charges:  therex 30', gait 8'    Subjective: Symptoms/Limitations Symptoms: Pt. states it is a little sore from last visit.  Reports compliance with HEP and states she tries to ice it as much as she can. Pain Assessment Currently in Pain?: Yes Pain Score:   4 Pain Location: Knee Pain Orientation: Left   Exercise/Treatments Aerobic Tread Mill: Gait training LL 79cm 0.63 cyc/sec x8' to improve stride length, heel toe pattern and knee flexion Standing Knee Flexion: 10 reps Lateral Step Up: 15 reps;Step Height: 4";Left;Hand Hold: 0 Forward Step Up: Left;15 reps;Hand Hold: 0;Step Height: 6" Step Down: Left;15 reps;Hand Hold: 0;Step Height: 4" Rocker Board: 2 minutes SLS with Vectors: 5X5" holds L LE Other Standing Knee Exercises: Warrior I 3x30" L leading, Warrior II 2X30" L leading Supine Short Arc Quad Sets: 10 reps;Limitations Short Arc Quad Sets Limitations: 3# Heel Slides: 10 reps Bridges: 10 reps Straight Leg Raises: 10 reps;Limitations Straight Leg Raises Limitations: 3# Knee Flexion: AROM;Limitations Knee Flexion Limitations: 127 degrees Sidelying Hip ABduction: 10 reps;Limitations Hip ABduction Limitations: 3# Hip ADduction: 10 reps;Limitations Hip ADduction Limitations: 3# Prone  Hamstring Curl: 10 reps;Limitations Hamstring Curl Limitations: 3# Hip Extension: 10 reps;Limitations Hip Extension Limitations: 3#     Physical Therapy Assessment and Plan PT Assessment and Plan Clinical Impression Statement: Pt. has met 2/3 STG's and 1/3 LTG's and is progressing well torward others. Increased ambulation speed on treadmill with improved quality. Pt. is now able to bend L knee to 127 degrees actively.   Pt is independent with downward dog Yoga pose;  progressed to Warrior II today and vector stance to help increase L knee stability.  No c/o pain with any exercises.  Pt. declined ice at end of session stating she would do when she got back to work. PT Plan: Continue to progress strength and stability of L knee.     Goals Home Exercise Program Pt will Perform Home Exercise Program: Independently PT Goal: Perform Home Exercise Program - Progress: Met PT Short Term Goals Time to Complete Short Term Goals: 4 weeks (10 days) PT Short Term Goal 1: ROM 0 to 120 to allow normalized walking PT Short Term Goal 1 - Progress: Met PT Short Term Goal 2: Pain decreased by 3 levels PT Short Term Goal 2 - Progress: Progressing toward goal PT Long Term Goals Time to Complete Long Term Goals:  (3 weeks) PT Long Term Goal 1: Pt pain to be no greater than a 1 80% of the day PT Long Term Goal 1 - Progress: Progressing toward goal PT Long Term Goal 2: Pt able to walk for an hour withour any discomfort PT Long Term Goal 2 - Progress: Progressing toward goal Long Term Goal 3: Pt to be able to sit for two hours without discomfort to be able to travel or go to the movies Long Term Goal 3 Progress: Met  Problem List Patient Active Problem List  Diagnosis  . S/P arthroscopy of left knee  . Medial meniscus, posterior horn derangement  . Chondromalacia of patella, left    PT - End of Session Activity Tolerance: Patient tolerated treatment well General Behavior During Session: [facility] for tasks performed Cognition: [facility] for tasks performed   Lurena Nida, PTA/CLT 02/12/2012, 1:59 PM

## 2012-02-17 ENCOUNTER — Ambulatory Visit (HOSPITAL_COMMUNITY)
Admission: RE | Admit: 2012-02-17 | Discharge: 2012-02-17 | Disposition: A | Discharge status: Home / Self Care | Payer: 59 | Source: Ambulatory Visit | Attending: Orthopedic Surgery | Admitting: Orthopedic Surgery

## 2012-02-17 DIAGNOSIS — IMO0001 Reserved for inherently not codable concepts without codable children: Secondary | ICD-10-CM | POA: Diagnosis not present

## 2012-02-17 NOTE — Progress Notes (Signed)
Physical Therapy Treatment Patient Details  Name: Kylie Duffy MRN: [id number] Date of Birth: Jun 14, 1955  Today's Date: 02/17/2012 Time: 4098-1191 PT Time Calculation (min): 42 min  Visit#: 6  of 9   Re-eval: 02/20/12 Charges: Gait x 10' Therex x 30'  Authorization: UMR    Subjective: Symptoms/Limitations Symptoms: Pt states that she vacuumed for the first time yesterday with slight soreness afterward. Pain Assessment Currently in Pain?: Yes Pain Score:   4 Pain Location: Knee Pain Orientation: Left  Exercise/Treatments Aerobic Tread Mill: Gait training LL 79cm 0.63 cyc/sec x10' to improve stride length, heel toe pattern and knee flexion Machines for Strengthening Cybex Knee Extension: 1pl x1 BLE Cybex Knee Flexion: 3pl x 10 BLE Standing Lateral Step Up: 15 reps;Left;Hand Hold: 0;Step Height: 6" Forward Step Up: 15 reps;Hand Hold: 0;Step Height: 6";Left Step Down: 15 reps;Hand Hold: 0;Step Height: 4";Left Functional Squat: 10 reps Stairs: 1 RT recip HR on descent Rocker Board: 2 minutes SLS with Vectors: 5X5" holds L LE Other Standing Knee Exercises: Warrior I 3x30" L leading, Warrior II 2X30" L leading  Physical Therapy Assessment and Plan PT Assessment and Plan Clinical Impression Statement: Pt stability continues to improve. Began Cybex for LE strengthening without pain or difficulty. Pt is able to ascend and descend stairs reciprocally only using HR on descent. Pt reports no change in pain throughout session. Pt declined ice stating she would ice her knee when she returns to work. PT Plan: Continue to progress strength and stability of L knee per PT POC. Complete progress note prior to MD appt next session.     Problem List Patient Active Problem List  Diagnosis  . S/P arthroscopy of left knee  . Medial meniscus, posterior horn derangement  . Chondromalacia of patella, left    PT - End of Session Activity Tolerance: Patient tolerated treatment  well General Behavior During Session: [facility] for tasks performed Cognition: [facility] for tasks performed  Seth Bake, PTA 02/17/2012, 2:46 PM

## 2012-02-18 ENCOUNTER — Ambulatory Visit: Payer: 59 | Admitting: Orthopedic Surgery

## 2012-02-19 ENCOUNTER — Ambulatory Visit (HOSPITAL_COMMUNITY)
Admission: RE | Admit: 2012-02-19 | Discharge: 2012-02-19 | Disposition: A | Discharge status: Home / Self Care | Payer: 59 | Source: Ambulatory Visit | Attending: Orthopedic Surgery | Admitting: Orthopedic Surgery

## 2012-02-19 DIAGNOSIS — IMO0001 Reserved for inherently not codable concepts without codable children: Secondary | ICD-10-CM | POA: Diagnosis not present

## 2012-02-19 NOTE — Progress Notes (Addendum)
Physical Therapy Discharge Note  Patient Details  Name: Kylie Duffy MRN: [id number] Date of Birth: 03/09/1955  Today's Date: 02/19/2012 Time: 1515-1600 PT Time Calculation (min): 45 min  Visit#: 7  of 9   Re-eval: 02/20/12 Charges: MMT x 1 ROMM x 1 Therex x 15' Self care x 10'  Authorization: UMR    Past Medical History:  Past Medical History  Diagnosis Date  . PONV (postoperative nausea and vomiting)    Past Surgical History:  Past Surgical History  Procedure Date  . Abdominal hysterectomy   . Tonsillectomy   . Diagnostic laparoscopy   . Knee arthroscopy with medial menisectomy 01/26/2012    Procedure: KNEE ARTHROSCOPY WITH MEDIAL MENISECTOMY;  Surgeon: Vickki Hearing, MD;  Location: [facility];  Service: Orthopedics;  Laterality: Left;    Subjective Symptoms/Limitations Symptoms: Pt states that she is feeling some better.  Pain Assessment Currently in Pain?: Yes Pain Score:   3 Pain Location: Knee Pain Orientation: Left   Assessment LLE AROM (degrees) Left Knee Extension: 0  Left Knee Flexion: 130  LLE Strength Left Hip Flexion: 5/5 Left Hip Extension:  (4+/5 was 3+/5) Left Hip ABduction: 5/5 Left Hip ADduction: 5/5 Left Knee Flexion:  (4+/5 was 4-/5) Left Knee Extension: 5/5 Left Ankle Dorsiflexion: 5/5  Exercise/Treatments Aerobic Tread Mill: Gait training LL 79cm 0.63 cyc/sec x5' to improve stride length, heel toe pattern and knee flexion Machines for Strengthening Cybex Knee Extension: 2pl x 15 BLE Cybex Knee Flexion: 3pl x 15 BLE Standing Lateral Step Up: 15 reps;Left;Hand Hold: 0;Step Height: 6" Forward Step Up: 15 reps;Hand Hold: 0;Step Height: 6";Left Step Down: 15 reps;Hand Hold: 0;Step Height: 4";Left Rocker Board: 2 minutes  Physical Therapy Assessment and Plan PT Assessment and Plan Clinical Impression Statement: Pt presents with improved ROM and strength. Pt has slight weakness in L HS and glute. Pt was made aware of this and  encouraged to focus on strengthening those muscles. Pt has returned to yoga without difficulty though she odes modify some of the stances. Pt's pain appears to be gradually decreasing. Pt is independent with exercises and is comfortable with D/C to HEP. PT Plan: Recommend D/C to HEP.    Goals Home Exercise Program Pt will Perform Home Exercise Program: Independently PT Short Term Goals Time to Complete Short Term Goals: 4 weeks (10 days) PT Short Term Goal 1: ROM 0 to 120 to allow normalized walking PT Short Term Goal 1 - Progress: Met PT Short Term Goal 2: Pain decreased by 3 levels PT Short Term Goal 2 - Progress: Met PT Long Term Goals Time to Complete Long Term Goals:  (3 weeks) PT Long Term Goal 1: Pt pain to be no greater than a 1 80% of the day PT Long Term Goal 1 - Progress: Progressing toward goal (Pt states her pain is not greater than 2/10 most of day) PT Long Term Goal 2: Pt able to walk for an hour withour any discomfort PT Long Term Goal 2 - Progress: Progressing toward goal Long Term Goal 3: Pt to be able to sit for two hours without discomfort to be able to travel or go to the movies Long Term Goal 3 Progress: Met  Problem List Patient Active Problem List  Diagnosis  . S/P arthroscopy of left knee  . Medial meniscus, posterior horn derangement  . Chondromalacia of patella, left    PT - End of Session Activity Tolerance: Patient tolerated treatment well General Behavior During Session: [facility] for  tasks performed Cognition: [facility] for tasks performed  Seth Bake, PTA 02/19/2012, 4:08 PM  Physician Documentation Your signature is required to indicate approval of the treatment plan as stated above.  Please sign and either send electronically or make a copy of this report for your files and return this physician signed original.   Please mark one 1.__approve of plan  2. ___approve of plan with the following conditions.   ______________________________                                                           _____________________ Physician Signature                                                                                                             Date

## 2012-02-23 ENCOUNTER — Ambulatory Visit (INDEPENDENT_AMBULATORY_CARE_PROVIDER_SITE_OTHER): Payer: 59 | Admitting: Orthopedic Surgery

## 2012-02-23 VITALS — BP 100/60 | Ht 63.0 in | Wt 124.0 lb

## 2012-02-23 DIAGNOSIS — Z9889 Other specified postprocedural states: Secondary | ICD-10-CM

## 2012-02-23 DIAGNOSIS — M23329 Other meniscus derangements, posterior horn of medial meniscus, unspecified knee: Secondary | ICD-10-CM

## 2012-02-23 DIAGNOSIS — M2240 Chondromalacia patellae, unspecified knee: Secondary | ICD-10-CM

## 2012-02-23 DIAGNOSIS — M2242 Chondromalacia patellae, left knee: Secondary | ICD-10-CM

## 2012-02-23 NOTE — Patient Instructions (Signed)
HOME EXERCISES X 3 MORE WEEKS  ICE AS NEEDED

## 2012-02-23 NOTE — Progress Notes (Signed)
Patient ID: Kylie Duffy Section, female   DOB: 1955-04-02, 57 y.o.   MRN: [id number] Chief Complaint  Patient presents with  . Follow-up    Post op SALK/  01/26/2012    1. S/P arthroscopy of left knee   2. Medial meniscus, posterior horn derangement   3. Chondromalacia of patella, left     BP 100/60  Ht 5\' 3"  (1.6 m)  Wt 124 lb (56.246 kg)  BMI 21.97 kg/m2  Doing well, status post knee scope completed physical therapy has occasional pain not bad full range of motion as been restored negative McMurray sign crepitance in the patellofemoral joint pain with patellofemoral compression overall improved continue home exercises 3 weeks return as needed

## 2012-12-03 ENCOUNTER — Encounter (INDEPENDENT_AMBULATORY_CARE_PROVIDER_SITE_OTHER): Payer: Self-pay

## 2012-12-03 ENCOUNTER — Encounter: Payer: Self-pay | Admitting: Adult Health

## 2012-12-03 ENCOUNTER — Ambulatory Visit (INDEPENDENT_AMBULATORY_CARE_PROVIDER_SITE_OTHER): Payer: 59 | Admitting: Adult Health

## 2012-12-03 VITALS — BP 130/80 | HR 74 | Ht 63.0 in | Wt 126.5 lb

## 2012-12-03 DIAGNOSIS — N898 Other specified noninflammatory disorders of vagina: Secondary | ICD-10-CM

## 2012-12-03 DIAGNOSIS — Z1212 Encounter for screening for malignant neoplasm of rectum: Secondary | ICD-10-CM

## 2012-12-03 DIAGNOSIS — Z01419 Encounter for gynecological examination (general) (routine) without abnormal findings: Secondary | ICD-10-CM

## 2012-12-03 HISTORY — DX: Other specified noninflammatory disorders of vagina: N89.8

## 2012-12-03 LAB — HEMOCCULT GUIAC POC 1CARD (OFFICE)

## 2012-12-03 NOTE — Patient Instructions (Signed)
Physical in 1 year Mammogram yearly get end of November call [phone] Labs fasting next year Colonoscopy 2018 Try astroglide for sex and luvena for vaginal moisture Call prn

## 2012-12-03 NOTE — Progress Notes (Signed)
Patient ID: Kylie Duffy, female   DOB: 1956-01-05, 57 y.o.   MRN: [id number] History of Present Illness: Clinton is a 57 year old white female married in for a physical.Has some vaginal dryness and discomfort with sex. Got flu shot at work.  Current Medications, Allergies, Past Medical History, Past Surgical History, Family History and Social History were reviewed in [name] record.     Review of Systems: Patient denies any headaches, blurred vision, shortness of breath, chest pain, abdominal pain, problems with bowel movements, urination, or mood swings, has some vaginal dryness and discomfort with sex, has swelling in left knee at times.   Physical Exam:BP 130/80  Pulse 74  Ht 5\' 3"  (1.6 m)  Wt 126 lb 8 oz (57.38 kg)  BMI 22.41 kg/m2 General:  Well developed, well nourished, no acute distress Skin:  Warm and dry Neck:  Midline trachea, normal thyroid Lungs; Clear to auscultation bilaterally Breast:  No dominant palpable mass, retraction, or nipple discharge Cardiovascular: Regular rate and rhythm Abdomen:  Soft, non tender, no hepatosplenomegaly Pelvic:  External genitalia is normal in appearance.  The vagina is normal in appearance for age. The cervix and uterus are absent No   adnexal masses or tenderness noted. Rectal: Good sphincter tone, no polyps, or hemorrhoids felt.  Hemoccult negative. Extremities:  Has spider veins and some swelling left knee Psych:  No mood changes, alert and cooperative   Impression: Yearly gyn exam Vaginal dryness    Plan: Physical in 1 year Mammogram yearly Colonoscopy 2018 Labs fasting next year Try astro glide with sex and luvena for moisture

## 2012-12-15 ENCOUNTER — Other Ambulatory Visit: Payer: Self-pay

## 2012-12-15 DIAGNOSIS — Z1231 Encounter for screening mammogram for malignant neoplasm of breast: Secondary | ICD-10-CM

## 2013-01-07 ENCOUNTER — Encounter: Payer: Self-pay | Admitting: Orthopedic Surgery

## 2013-01-14 ENCOUNTER — Ambulatory Visit
Admission: RE | Admit: 2013-01-14 | Discharge: 2013-01-14 | Disposition: A | Discharge status: Home / Self Care | Payer: 59 | Source: Ambulatory Visit

## 2013-01-14 DIAGNOSIS — Z1231 Encounter for screening mammogram for malignant neoplasm of breast: Secondary | ICD-10-CM

## 2013-01-27 ENCOUNTER — Encounter: Payer: Self-pay | Admitting: Adult Health

## 2013-02-25 ENCOUNTER — Encounter (HOSPITAL_COMMUNITY): Payer: Self-pay | Admitting: Emergency Medicine

## 2013-02-25 ENCOUNTER — Emergency Department (HOSPITAL_COMMUNITY)
Admission: EM | Admit: 2013-02-25 | Discharge: 2013-02-25 | Disposition: A | Discharge status: Home / Self Care | Payer: 59 | Source: Home / Self Care | Attending: Family Medicine | Admitting: Family Medicine

## 2013-02-25 DIAGNOSIS — J40 Bronchitis, not specified as acute or chronic: Secondary | ICD-10-CM

## 2013-02-25 DIAGNOSIS — J069 Acute upper respiratory infection, unspecified: Secondary | ICD-10-CM

## 2013-02-25 MED ORDER — HYDROCOD POLST-CHLORPHEN POLST 10-8 MG/5ML PO LQCR: 5.0000 mL | Freq: Two times a day (BID) | ORAL | Status: DC | PRN

## 2013-02-25 MED ORDER — AZITHROMYCIN 250 MG PO TABS: ORAL_TABLET | ORAL | Status: DC

## 2013-02-25 NOTE — ED Provider Notes (Signed)
Medical screening examination/treatment/procedure(s) were performed by resident physician or non-physician practitioner and as supervising physician I was immediately available for consultation/collaboration.   KINDL,JAMES DOUGLAS MD.   James D Kindl, MD 02/25/13 2154 

## 2013-02-25 NOTE — ED Notes (Signed)
C/o  Chest congestion.  Productive cough with clear sputum.  Fatigue.  Chills.   Denies fever n/v/d.  No relief with otc meds.  On set of symptoms 1/25

## 2013-02-25 NOTE — ED Provider Notes (Signed)
CSN: [id number]     Arrival date & time 02/25/13  1427 History   First MD Initiated Contact with Patient 02/25/13 1530     Chief Complaint  Patient presents with  . URI    Patient is a 58 y.o. female presenting with URI. The history is provided by the patient.  URI Presenting symptoms: congestion, cough, fatigue, rhinorrhea and sore throat   Presenting symptoms: no ear pain   Severity:  Moderate Onset quality:  Gradual Duration:  6 days Timing:  Constant Progression:  Unchanged Chronicity:  Recurrent Relieved by:  Nothing Ineffective treatments:  OTC medications Associated symptoms: no wheezing   Pt reports onset of sorethroat Sunday with gradual onset of  runny nose, chest and sinus congestion, cough and chills. The cough is harsh and worse at night. Pt states she had similar symptoms around New Years that seemed to briefly improve. She has rec's no relief w/ multiple OTC cold and flu medications. Denie sfever, N/V/D, CP or SOB.  Past Medical History  Diagnosis Date  . PONV (postoperative nausea and vomiting)   . History of UTI   . Vaginal dryness 12/03/2012   Past Surgical History  Procedure Laterality Date  . Abdominal hysterectomy    . Tonsillectomy    . Diagnostic laparoscopy    . Knee arthroscopy with medial menisectomy  01/26/2012    Procedure: KNEE ARTHROSCOPY WITH MEDIAL MENISECTOMY;  Surgeon: Carole Civil, MD;  Location: [facility];  Service: Orthopedics;  Laterality: Left;   Family History  Problem Relation Age of Onset  . Hypertension Mother   . Hyperlipidemia Mother   . Thyroid disease Mother   . Heart disease Father   . Cancer Maternal Aunt   . Cancer Maternal Uncle   . Cancer Paternal Aunt   . Cancer Paternal Uncle   . Diabetes Maternal Grandmother    History  Substance Use Topics  . Smoking status: Never Smoker   . Smokeless tobacco: Never Used  . Alcohol Use: Yes     Comment: occ glass of wine   OB History   Grav Para Term Preterm Abortions  TAB SAB Ect Mult Living   2    2  1 1        Review of Systems  Constitutional: Positive for fatigue.  HENT: Positive for congestion, rhinorrhea and sore throat. Negative for ear pain.   Eyes: Negative.   Respiratory: Positive for cough. Negative for chest tightness, shortness of breath and wheezing.   Cardiovascular: Negative.   Gastrointestinal: Negative.   Endocrine: Negative.   Genitourinary: Negative.   Allergic/Immunologic: Negative.   Neurological: Negative.   Hematological: Negative.   Psychiatric/Behavioral: Negative.     Allergies  Review of patient's allergies indicates no known allergies.  Home Medications   Current Outpatient Rx  Name  Route  Sig  Dispense  Refill  . Calcium Carbonate-Vitamin D (CALCIUM 600 + D PO)   Oral   Take 1 tablet by mouth daily.         . Multiple Vitamin (MULITIVITAMIN WITH MINERALS) TABS   Oral   Take 1 tablet by mouth daily.         Marland Kitchen azithromycin (ZITHROMAX Z-PAK) 250 MG tablet      Take 2 tabs on day 1, then 1 tab on days 2-5   6 tablet   0   . chlorpheniramine-HYDROcodone (TUSSIONEX PENNKINETIC ER) 10-8 MG/5ML LQCR   Oral   Take 5 mLs by mouth every 12 (twelve) hours  as needed for cough.   50 mL   0    BP 130/75  Pulse 66  Temp(Src) 98.3 F (36.8 C) (Oral)  Resp 16  SpO2 100% Physical Exam  Constitutional: She is oriented to person, place, and time. She appears well-developed and well-nourished.  HENT:  Head: Normocephalic and atraumatic.  Right Ear: External ear normal.  Left Ear: External ear normal.  Nose: Nose normal.  Mouth/Throat: Oropharynx is clear and moist.  Eyes: Conjunctivae are normal.  Neck: Neck supple.  Cardiovascular: Normal rate and regular rhythm.   Pulmonary/Chest: Effort normal and breath sounds normal.  Musculoskeletal: Normal range of motion.  Neurological: She is alert and oriented to person, place, and time.  Skin: Skin is warm and dry.  Psychiatric: She has a normal mood and  affect.    ED Course  Procedures (including critical care time) Labs Review Labs Reviewed - No data to display Imaging Review No results found.    MDM   1. 2. URI Bronchitis   Recurrent URI sx's w/ harsh cough in a month. No fever. Will treat w/ Z-Pak and Tussionex for cough and encourage f/u if not improving.    Jeryl Columbia, NP 02/25/13 1912

## 2013-02-25 NOTE — Discharge Instructions (Signed)
Cough, Adult  A cough is a reflex. It helps you clear your throat and airways. A cough can help heal your body. A cough can last 2 or 3 weeks (acute) or may last more than 8 weeks (chronic). Some common causes of a cough can include an infection, allergy, or a cold. HOME CARE  Only take medicine as told by your doctor.  If given, take your medicines (antibiotics) as told. Finish them even if you start to feel better.  Use a cold steam vaporizer or humidier in your home. This can help loosen thick spit (secretions).  Sleep so you are almost sitting up (semi-upright). Use pillows to do this. This helps reduce coughing.  Rest as needed.  Stop smoking if you smoke. GET HELP RIGHT AWAY IF:  You have yellowish-white fluid (pus) in your thick spit.  Your cough gets worse.  Your medicine does not reduce coughing, and you are losing sleep.  You cough up blood.  You have trouble breathing.  Your pain gets worse and medicine does not help.  You have a fever. MAKE SURE YOU:   Understand these instructions.  Will watch your condition.  Will get help right away if you are not doing well or get worse. Document Released: 09/26/2010 Document Revised: 04/07/2011 Document Reviewed: 09/26/2010 [facility] Patient Information 2014 [place].  Upper Respiratory Infection, Adult An upper respiratory infection (URI) is also sometimes known as the common cold. The upper respiratory tract includes the nose, sinuses, throat, trachea, and bronchi. Bronchi are the airways leading to the lungs. Most people improve within 1 week, but symptoms can last up to 2 weeks. A residual cough may last even longer.  CAUSES Many different viruses can infect the tissues lining the upper respiratory tract. The tissues become irritated and inflamed and often become very moist. Mucus production is also common. A cold is contagious. You can easily spread the virus to others by oral contact. This includes kissing,  sharing a glass, coughing, or sneezing. Touching your mouth or nose and then touching a surface, which is then touched by another person, can also spread the virus. SYMPTOMS  Symptoms typically develop 1 to 3 days after you come in contact with a cold virus. Symptoms vary from person to person. They may include:  Runny nose.  Sneezing.  Nasal congestion.  Sinus irritation.  Sore throat.  Loss of voice (laryngitis).  Cough.  Fatigue.  Muscle aches.  Loss of appetite.  Headache.  Low-grade fever. DIAGNOSIS  You might diagnose your own cold based on familiar symptoms, since most people get a cold 2 to 3 times a year. Your caregiver can confirm this based on your exam. Most importantly, your caregiver can check that your symptoms are not due to another disease such as strep throat, sinusitis, pneumonia, asthma, or epiglottitis. Blood tests, throat tests, and X-rays are not necessary to diagnose a common cold, but they may sometimes be helpful in excluding other more serious diseases. Your caregiver will decide if any further tests are required. RISKS AND COMPLICATIONS  You may be at risk for a more severe case of the common cold if you smoke cigarettes, have chronic heart disease (such as heart failure) or lung disease (such as asthma), or if you have a weakened immune system. The very young and very old are also at risk for more serious infections. Bacterial sinusitis, middle ear infections, and bacterial pneumonia can complicate the common cold. The common cold can worsen asthma and chronic obstructive  pulmonary disease (COPD). Sometimes, these complications can require emergency medical care and may be life-threatening. PREVENTION  The best way to protect against getting a cold is to practice good hygiene. Avoid oral or hand contact with people with cold symptoms. Wash your hands often if contact occurs. There is no clear evidence that vitamin C, vitamin E, echinacea, or exercise  reduces the chance of developing a cold. However, it is always recommended to get plenty of rest and practice good nutrition. TREATMENT  Treatment is directed at relieving symptoms. There is no cure. Antibiotics are not effective, because the infection is caused by a virus, not by bacteria. Treatment may include:  Increased fluid intake. Sports drinks offer valuable electrolytes, sugars, and fluids.  Breathing heated mist or steam (vaporizer or shower).  Eating chicken soup or other clear broths, and maintaining good nutrition.  Getting plenty of rest.  Using gargles or lozenges for comfort.  Controlling fevers with ibuprofen or acetaminophen as directed by your caregiver.  Increasing usage of your inhaler if you have asthma. Zinc gel and zinc lozenges, taken in the first 24 hours of the common cold, can shorten the duration and lessen the severity of symptoms. Pain medicines may help with fever, muscle aches, and throat pain. A variety of non-prescription medicines are available to treat congestion and runny nose. Your caregiver can make recommendations and may suggest nasal or lung inhalers for other symptoms.  HOME CARE INSTRUCTIONS   Only take over-the-counter or prescription medicines for pain, discomfort, or fever as directed by your caregiver.  Use a warm mist humidifier or inhale steam from a shower to increase air moisture. This may keep secretions moist and make it easier to breathe.  Drink enough water and fluids to keep your urine clear or pale yellow.  Rest as needed.  Return to work when your temperature has returned to normal or as your caregiver advises. You may need to stay home longer to avoid infecting others. You can also use a face mask and careful hand washing to prevent spread of the virus. SEEK MEDICAL CARE IF:   After the first few days, you feel you are getting worse rather than better.  You need your caregiver's advice about medicines to control  symptoms.  You develop chills, worsening shortness of breath, or brown or red sputum. These may be signs of pneumonia.  You develop yellow or brown nasal discharge or pain in the face, especially when you bend forward. These may be signs of sinusitis.  You develop a fever, swollen neck glands, pain with swallowing, or white areas in the back of your throat. These may be signs of strep throat. SEEK IMMEDIATE MEDICAL CARE IF:   You have a fever.  You develop severe or persistent headache, ear pain, sinus pain, or chest pain.  You develop wheezing, a prolonged cough, cough up blood, or have a change in your usual mucus (if you have chronic lung disease).  You develop sore muscles or a stiff neck. Document Released: 07/09/2000 Document Revised: 04/07/2011 Document Reviewed: 05/17/2010 [facility] Patient Information 2014 [place], [place].  Bronchitis Bronchitis is inflammation of the airways that extend from the windpipe into the lungs (bronchi). The inflammation often causes mucus to develop, which leads to a cough. If the inflammation becomes severe, it may cause shortness of breath. CAUSES  Bronchitis may be caused by:   Viral infections.   Bacteria.   Cigarette smoke.   Allergens, pollutants, and other irritants.  SIGNS AND SYMPTOMS  The most common symptom of bronchitis is a frequent cough that produces mucus. Other symptoms include:  Fever.   Body aches.   Chest congestion.   Chills.   Shortness of breath.   Sore throat.  DIAGNOSIS  Bronchitis is usually diagnosed through a medical history and physical exam. Tests, such as chest X-rays, are sometimes done to rule out other conditions.  TREATMENT  You may need to avoid contact with whatever caused the problem (smoking, for example). Medicines are sometimes needed. These may include:  Antibiotics. These may be prescribed if the condition is caused by bacteria.  Cough suppressants. These may be prescribed for  relief of cough symptoms.   Inhaled medicines. These may be prescribed to help open your airways and make it easier for you to breathe.   Steroid medicines. These may be prescribed for those with recurrent (chronic) bronchitis. HOME CARE INSTRUCTIONS  Get plenty of rest.   Drink enough fluids to keep your urine clear or pale yellow (unless you have a medical condition that requires fluid restriction). Increasing fluids may help thin your secretions and will prevent dehydration.   Only take over-the-counter or prescription medicines as directed by your health care provider.  Only take antibiotics as directed. Make sure you finish them even if you start to feel better.  Avoid secondhand smoke, irritating chemicals, and strong fumes. These will make bronchitis worse. If you are a smoker, quit smoking. Consider using nicotine gum or skin patches to help control withdrawal symptoms. Quitting smoking will help your lungs heal faster.   Put a cool-mist humidifier in your bedroom at night to moisten the air. This may help loosen mucus. Change the water in the humidifier daily. You can also run the hot water in your shower and sit in the bathroom with the door closed for 5 10 minutes.   Follow up with your health care provider as directed.   Wash your hands frequently to avoid catching bronchitis again or spreading an infection to others.  SEEK MEDICAL CARE IF: Your symptoms do not improve after 1 week of treatment.  SEEK IMMEDIATE MEDICAL CARE IF:  Your fever increases.  You have chills.   You have chest pain.   You have worsening shortness of breath.   You have bloody sputum.  You faint.  You have lightheadedness.  You have a severe headache.   You vomit repeatedly. MAKE SURE YOU:   Understand these instructions.  Will watch your condition.  Will get help right away if you are not doing well or get worse. Document Released: 01/13/2005 Document Revised:  11/03/2012 Document Reviewed: 09/07/2012 [facility] Patient Information 2014 [place].

## 2013-07-28 ENCOUNTER — Encounter: Payer: Self-pay | Admitting: Adult Health

## 2013-07-28 ENCOUNTER — Telehealth: Payer: Self-pay | Admitting: Adult Health

## 2013-07-28 ENCOUNTER — Ambulatory Visit (INDEPENDENT_AMBULATORY_CARE_PROVIDER_SITE_OTHER): Payer: 59 | Admitting: Adult Health

## 2013-07-28 VITALS — BP 120/70 | Ht 63.5 in | Wt 134.0 lb

## 2013-07-28 DIAGNOSIS — R300 Dysuria: Secondary | ICD-10-CM

## 2013-07-28 DIAGNOSIS — R319 Hematuria, unspecified: Secondary | ICD-10-CM

## 2013-07-28 HISTORY — DX: Hematuria, unspecified: R31.9

## 2013-07-28 LAB — POCT URINALYSIS DIPSTICK
Glucose, UA: NEGATIVE
KETONES UA: NEGATIVE
Leukocytes, UA: NEGATIVE
Nitrite, UA: NEGATIVE
PROTEIN UA: NEGATIVE

## 2013-07-28 MED ORDER — NITROFURANTOIN MONOHYD MACRO 100 MG PO CAPS: 100.0000 mg | ORAL_CAPSULE | Freq: Two times a day (BID) | ORAL | Status: DC

## 2013-07-28 NOTE — Patient Instructions (Signed)
Push fluids

## 2013-07-28 NOTE — Telephone Encounter (Signed)
Pt to come in for urine sample only to be sent out for possible bladder infection today at 11:45 am.

## 2013-07-28 NOTE — Progress Notes (Signed)
Subjective:     Patient ID: Kylie Duffy, female   DOB: 1955-08-25, 58 y.o.   MRN: [id number]  HPI Kylie Duffy is a 58 year old white female in complaining of UTI symptoms for urine check  Review of Systems See HPI Reviewed past medical,surgical, social and family history. Reviewed medications and allergies.     Objective:   Physical Exam BP 120/70  Ht 5' 3.5" (1.613 m)  Wt 134 lb (60.782 kg)  BMI 23.36 kg/m2   urine + blood, will rx Macrobid and push fluids  Assessment:     Hematuria     Plan:      rx macrobid 1 bid x 7 days  Push fluids Follow up prn

## 2013-07-28 NOTE — Telephone Encounter (Signed)
Left message x 1. JSY 

## 2013-11-28 ENCOUNTER — Encounter: Payer: Self-pay | Admitting: Adult Health

## 2013-12-08 ENCOUNTER — Encounter: Payer: Self-pay | Admitting: *Deleted

## 2013-12-16 ENCOUNTER — Encounter: Payer: Self-pay | Admitting: Adult Health

## 2013-12-16 ENCOUNTER — Ambulatory Visit (INDEPENDENT_AMBULATORY_CARE_PROVIDER_SITE_OTHER): Payer: 59 | Admitting: Adult Health

## 2013-12-16 ENCOUNTER — Other Ambulatory Visit: Payer: Self-pay

## 2013-12-16 VITALS — BP 128/72 | HR 76 | Ht 63.5 in | Wt 132.0 lb

## 2013-12-16 DIAGNOSIS — Z01419 Encounter for gynecological examination (general) (routine) without abnormal findings: Secondary | ICD-10-CM

## 2013-12-16 DIAGNOSIS — N898 Other specified noninflammatory disorders of vagina: Secondary | ICD-10-CM

## 2013-12-16 DIAGNOSIS — Z1231 Encounter for screening mammogram for malignant neoplasm of breast: Secondary | ICD-10-CM

## 2013-12-16 DIAGNOSIS — Z1212 Encounter for screening for malignant neoplasm of rectum: Secondary | ICD-10-CM

## 2013-12-16 LAB — HEMOCCULT GUIAC POC 1CARD (OFFICE): Fecal Occult Blood, POC: NEGATIVE

## 2013-12-16 NOTE — Progress Notes (Signed)
Patient ID: Kylie Duffy, female   DOB: 07-23-1955, 58 y.o.   MRN: [id number] History of Present Illness: Kylie Duffy is a 58 year old white female, married in for gyn exam.Having more headaches, has appt with dr Buelah Manis 12/9 will discuss with her then.Has vaginal dryness. Got flu shot at work.  Current Medications, Allergies, Past Medical History, Past Surgical History, Family History and Social History were reviewed in [employer] record.     Review of Systems: Patient denies any blurred vision, shortness of breath, chest pain, abdominal pain, problems with bowel movements, urination, or intercourse. No joint pain or mood swings, see HPI for positives.    Physical Exam:BP 128/72 mmHg  Pulse 76  Ht 5' 3.5" (1.613 m)  Wt 132 lb (59.875 kg)  BMI 23.01 kg/m2 General:  Well developed, well nourished, no acute distress Skin:  Warm and dry Neck:  Midline trachea, normal thyroid Lungs; Clear to auscultation bilaterally Breast:  No dominant palpable mass, retraction, or nipple discharge Cardiovascular: Regular rate and rhythm Abdomen:  Soft, non tender, no hepatosplenomegaly Pelvic:  External genitalia is normal in appearance for age, no lesions.  The vagina has decrease color, moisture and rugae.The cervix and uterus are absent.  No  adnexal masses or tenderness noted. Rectal: Good sphincter tone, no polyps, or hemorrhoids felt.  Hemoccult negative. Extremities:  No swelling  Noted, has spider veins Psych:  No mood changes,alert and cooperative,seems happy   Impression: Well woman gyn exam no pap Vaginal dryness    Plan: Physical in 1 year Mammogram yearly  Colonoscopy per GI See Dr Buelah Manis 12/9, get labs with her Try luvena and astro glide, vaginal estrogen is option if desired

## 2013-12-16 NOTE — Patient Instructions (Signed)
Physical in 1 year Mammogram yearly Colonoscopy per GI See Dr Buelah Manis 12/9

## 2014-01-04 ENCOUNTER — Encounter: Payer: Self-pay | Admitting: *Deleted

## 2014-01-04 ENCOUNTER — Encounter: Payer: Self-pay | Admitting: Family Medicine

## 2014-01-04 ENCOUNTER — Ambulatory Visit (INDEPENDENT_AMBULATORY_CARE_PROVIDER_SITE_OTHER): Payer: 59 | Admitting: Family Medicine

## 2014-01-04 VITALS — BP 98/52 | HR 76 | Temp 97.8°F | Resp 14 | Ht 64.0 in | Wt 133.0 lb

## 2014-01-04 DIAGNOSIS — M8580 Other specified disorders of bone density and structure, unspecified site: Secondary | ICD-10-CM

## 2014-01-04 DIAGNOSIS — Z0000 Encounter for general adult medical examination without abnormal findings: Secondary | ICD-10-CM

## 2014-01-04 DIAGNOSIS — G43909 Migraine, unspecified, not intractable, without status migrainosus: Secondary | ICD-10-CM | POA: Insufficient documentation

## 2014-01-04 DIAGNOSIS — G43109 Migraine with aura, not intractable, without status migrainosus: Secondary | ICD-10-CM

## 2014-01-04 MED ORDER — SUMATRIPTAN SUCCINATE 100 MG PO TABS: 100.0000 mg | ORAL_TABLET | ORAL | Status: DC | PRN

## 2014-01-04 NOTE — Assessment & Plan Note (Signed)
Most likely brought on by tension/stress Trial of Imitrex at onset Decrease caffiene use

## 2014-01-04 NOTE — Progress Notes (Signed)
Patient ID: Kylie Duffy, female   DOB: 03/10/1955, 58 y.o.   MRN: [id number]   Subjective:    Patient ID: Kylie Duffy, female    DOB: 1955-05-18, 58 y.o.   MRN: [id number]  Patient presents for New Patient- Establish Care  patient here to establish care and for physical exam. She's not had a previous PCP. [facility]. Dermatology - Dr. Jaci Lazier   She was seen by gastroenterology for colonoscopy at age 18 and she is also status post left knee arthroscopically by Dr. Aline Brochure. 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  She has no significant past medical history. Her only complaint today is headaches that she has approximately once a month and may last 2-3 days this has been going on for about 6 months now. She has a lot of stress at work and there some stress regarding her mother whom she is helping to take care of from [place]. There's been no change in her vision with episodes but she does get light sensitive no nausea or vomiting associated. No neurological changes. She typically treats with a few doses of ibuprofen and this helps overtime and relieves the headache. She does occasionally see an alternative medicine provider who also does massage therapy name Vivi Barrack.  Pain felt bilat temp ,moves behind her eye, often sees floaters or aura at the beginning of headache    Review Of Systems:  GEN- denies fatigue, fever, weight loss,weakness, recent illness HEENT- denies eye drainage, change in vision, nasal discharge, CVS- denies chest pain, palpitations RESP- denies SOB, cough, wheeze ABD- denies N/V, change in stools, abd pain GU- denies dysuria, hematuria, dribbling, incontinence MSK-+ joint pain, muscle aches, injury Neuro- + headache, Denies dizziness, syncope, seizure activity       Objective:    BP 98/52 mmHg  Pulse 76  Temp(Src) 97.8 F (36.6 C) (Oral)  Resp 14  Ht 5\' 4"  (1.626 m)  Wt 133 lb (60.328 kg)  BMI 22.82 kg/m2 GEN- NAD,  alert and oriented x3 HEENT- PERRL, EOMI, non injected sclera, pink conjunctiva, MMM, oropharynx clear Neck- Supple, no thyromegaly CVS- RRR, no murmur RESP-CTAB ABD-NABS,soft,NT,ND GYN- deferred EXT- No edema NEURO-CNII-XII  In tact, no deficits  Pulses- Radial 2+        Assessment & Plan:      Problem List Items Addressed This Visit    None    Visit Diagnoses    Osteopenia    -  Primary    Relevant Orders       DG Bone Density       Vitamin D, 25-hydroxy    Routine general medical examination at a health care facility        Fasting labs to be done, check on TDAP, flu UTD, colonoscopy UTD    Relevant Orders       CBC with Differential       Comprehensive metabolic panel       Lipid panel       TSH       Note: This dictation was prepared with Dragon dictation along with smaller phrase technology. Any transcriptional errors that result from this process are unintentional.

## 2014-01-04 NOTE — Patient Instructions (Addendum)
Try the imitrex  Continue supplements  Fasting labs  Bone Density  F/U As needed or in 1 year

## 2014-01-04 NOTE — Assessment & Plan Note (Signed)
Recheck Bone Density has been about 5 years Mammogram UTD

## 2014-01-08 LAB — LIPID PANEL
CHOL/HDL RATIO: 4 ratio
Cholesterol: 238 mg/dL — ABNORMAL HIGH (ref 0–200)
HDL: 60 mg/dL (ref >39)
LDL CALC: 164 mg/dL — AB (ref 0–99)
Triglycerides: 68 mg/dL (ref <150)
VLDL: 14 mg/dL (ref 0–40)

## 2014-01-08 LAB — VITAMIN D 25 HYDROXY (VIT D DEFICIENCY, FRACTURES): Vit D, 25-Hydroxy: 28 ng/mL — ABNORMAL LOW (ref 30–100)

## 2014-01-08 LAB — CBC WITH DIFFERENTIAL/PLATELET
BASOS ABS: 0.1 10*3/uL (ref 0.0–0.1)
Basophils Relative: 1 % (ref 0–1)
EOS PCT: 2 % (ref 0–5)
Eosinophils Absolute: 0.1 10*3/uL (ref 0.0–0.7)
HCT: 40.1 % (ref 36.0–46.0)
Hemoglobin: 13.5 g/dL (ref 12.0–15.0)
LYMPHS PCT: 29 % (ref 12–46)
Lymphs Abs: 1.8 10*3/uL (ref 0.7–4.0)
MCH: 29.5 pg (ref 26.0–34.0)
MCHC: 33.7 g/dL (ref 30.0–36.0)
MCV: 87.7 fL (ref 78.0–100.0)
MPV: 10.5 fL (ref 9.4–12.4)
Monocytes Absolute: 0.4 10*3/uL (ref 0.1–1.0)
Monocytes Relative: 6 % (ref 3–12)
NEUTROS ABS: 3.9 10*3/uL (ref 1.7–7.7)
Neutrophils Relative %: 62 % (ref 43–77)
PLATELETS: 433 10*3/uL — AB (ref 150–400)
RBC: 4.57 MIL/uL (ref 3.87–5.11)
RDW: 14 % (ref 11.5–15.5)
WBC: 6.3 10*3/uL (ref 4.0–10.5)

## 2014-01-08 LAB — COMPREHENSIVE METABOLIC PANEL
ALK PHOS: 66 U/L (ref 39–117)
ALT: 9 U/L (ref 0–35)
AST: 15 U/L (ref 0–37)
Albumin: 4.2 g/dL (ref 3.5–5.2)
BUN: 21 mg/dL (ref 6–23)
CALCIUM: 9.2 mg/dL (ref 8.4–10.5)
CHLORIDE: 103 meq/L (ref 96–112)
CO2: 28 mEq/L (ref 19–32)
Creat: 0.77 mg/dL (ref 0.50–1.10)
Glucose, Bld: 83 mg/dL (ref 70–99)
POTASSIUM: 4.3 meq/L (ref 3.5–5.3)
Sodium: 139 mEq/L (ref 135–145)
Total Bilirubin: 0.4 mg/dL (ref 0.2–1.2)
Total Protein: 6.9 g/dL (ref 6.0–8.3)

## 2014-01-08 LAB — TSH: TSH: 1.753 u[IU]/mL (ref 0.350–4.500)

## 2014-01-18 ENCOUNTER — Ambulatory Visit
Admission: RE | Admit: 2014-01-18 | Discharge: 2014-01-18 | Disposition: A | Discharge status: Home / Self Care | Payer: 59 | Source: Ambulatory Visit

## 2014-01-18 DIAGNOSIS — Z1231 Encounter for screening mammogram for malignant neoplasm of breast: Secondary | ICD-10-CM

## 2014-01-25 ENCOUNTER — Ambulatory Visit (HOSPITAL_COMMUNITY)
Admission: RE | Admit: 2014-01-25 | Discharge: 2014-01-25 | Disposition: A | Discharge status: Home / Self Care | Payer: 59 | Source: Ambulatory Visit | Attending: Family Medicine | Admitting: Family Medicine

## 2014-01-25 DIAGNOSIS — M8580 Other specified disorders of bone density and structure, unspecified site: Secondary | ICD-10-CM | POA: Insufficient documentation

## 2014-01-25 DIAGNOSIS — Z13820 Encounter for screening for osteoporosis: Secondary | ICD-10-CM | POA: Insufficient documentation

## 2014-01-25 DIAGNOSIS — M818 Other osteoporosis without current pathological fracture: Secondary | ICD-10-CM | POA: Insufficient documentation

## 2014-01-25 DIAGNOSIS — E559 Vitamin D deficiency, unspecified: Secondary | ICD-10-CM | POA: Diagnosis not present

## 2014-01-25 DIAGNOSIS — Z90722 Acquired absence of ovaries, bilateral: Secondary | ICD-10-CM | POA: Diagnosis not present

## 2014-01-25 DIAGNOSIS — Z780 Asymptomatic menopausal state: Secondary | ICD-10-CM | POA: Insufficient documentation

## 2014-02-03 ENCOUNTER — Encounter: Payer: Self-pay | Admitting: Family Medicine

## 2014-02-13 ENCOUNTER — Ambulatory Visit (INDEPENDENT_AMBULATORY_CARE_PROVIDER_SITE_OTHER): Payer: 59

## 2014-02-13 ENCOUNTER — Other Ambulatory Visit: Payer: Self-pay | Admitting: *Deleted

## 2014-02-13 DIAGNOSIS — M545 Low back pain, unspecified: Secondary | ICD-10-CM

## 2014-06-28 ENCOUNTER — Encounter: Payer: Self-pay | Admitting: Family Medicine

## 2014-06-29 ENCOUNTER — Encounter: Payer: Self-pay | Admitting: Family Medicine

## 2014-06-29 ENCOUNTER — Encounter: Payer: 59 | Admitting: Physician Assistant

## 2014-06-30 ENCOUNTER — Encounter: Payer: Self-pay | Admitting: Family Medicine

## 2014-06-30 ENCOUNTER — Ambulatory Visit (INDEPENDENT_AMBULATORY_CARE_PROVIDER_SITE_OTHER): Payer: 59 | Admitting: Family Medicine

## 2014-06-30 VITALS — BP 104/70 | HR 80 | Temp 97.9°F | Resp 16 | Wt 132.0 lb

## 2014-06-30 DIAGNOSIS — T63461A Toxic effect of venom of wasps, accidental (unintentional), initial encounter: Secondary | ICD-10-CM

## 2014-06-30 MED ORDER — DOXYCYCLINE HYCLATE 100 MG PO TABS: 100.0000 mg | ORAL_TABLET | Freq: Two times a day (BID) | ORAL | Status: DC

## 2014-06-30 NOTE — Progress Notes (Signed)
   Subjective:    Patient ID: Kylie Duffy, female    DOB: December 22, 1955, 59 y.o.   MRN: [id number]  HPI   patient was stung on her index finger Wednesday by yellow jacket. By Thursday the finger had become red hot and swollen distal to the PIP joint. She was concerned that she had cellulitis so she started taking some doxycycline that she had at home and she is here today for an evaluation. The finger looks much better. There is no signs of swelling. There is no erythema. There is no evidence of cellulitis. Past Medical History  Diagnosis Date  . PONV (postoperative nausea and vomiting)   . History of UTI   . Vaginal dryness 12/03/2012  . Hematuria 07/28/2013  . Osteopenia    Past Surgical History  Procedure Laterality Date  . Abdominal hysterectomy    . Tonsillectomy    . Diagnostic laparoscopy    . Knee arthroscopy with medial menisectomy  01/26/2012    Procedure: KNEE ARTHROSCOPY WITH MEDIAL MENISECTOMY;  Surgeon: Carole Civil, MD;  Location: [facility];  Service: Orthopedics;  Laterality: Left;   Current Outpatient Prescriptions on File Prior to Visit  Medication Sig Dispense Refill  . Calcium Carbonate-Vitamin D (CALCIUM 600 + D PO) Take 1 tablet by mouth daily.    . Multiple Vitamin (MULITIVITAMIN WITH MINERALS) TABS Take 1 tablet by mouth daily.    . SUMAtriptan (IMITREX) 100 MG tablet Take 1 tablet (100 mg total) by mouth every 2 (two) hours as needed for migraine or headache. May repeat in 2 hours if headache persists or recurs. 10 tablet 1   No current facility-administered medications on file prior to visit.   No Known Allergies History   Social History  . Marital Status: Married    Spouse Name: N/A  . Number of Children: N/A  . Years of Education: N/A   Occupational History  . Not on file.   Social History Main Topics  . Smoking status: Never Smoker   . Smokeless tobacco: Never Used  . Alcohol Use: Yes     Comment: occ glass of wine  . Drug Use: No  . Sexual  Activity: Yes    Birth Control/ Protection: Surgical   Other Topics Concern  . Not on file   Social History Narrative     Review of Systems  All other systems reviewed and are negative.      Objective:   Physical Exam  Cardiovascular: Normal rate, regular rhythm and normal heart sounds.   Pulmonary/Chest: Effort normal and breath sounds normal.  Musculoskeletal:       Right hand: Normal.  Vitals reviewed.         Assessment & Plan:  Yellow jacket sting, accidental or unintentional, initial encounter   I do not believe the patient had cellulitis. Rather I believe she had a vigorous immune response to yellow jacket sting PA and I believe that the redness is improving not because of the doxycycline but due to the passage of time. I recommended that the patient stop the doxycycline and take Benadryl and monitor over the weekend. If the hand continues to improve no further treatment is necessary. I did give her a prescription for doxycycline on the off chance that she has partially treated the cellulitis. I explained to the patient that if the finally red erythema returns when she stops anabolic to get this prescription and take the anabolic so immediately. She is comfortable with this plan.

## 2014-06-30 NOTE — Progress Notes (Signed)
This encounter was created in error - please disregard.

## 2014-10-27 ENCOUNTER — Telehealth: Payer: Self-pay | Admitting: Adult Health

## 2014-10-27 NOTE — Telephone Encounter (Signed)
Spoke with pt. Pt is having urinary frequency, burning with urination, and pressure with urination. Pt has been increasing fluids. I advised she would need to be seen. Pt voiced understanding. Pt to call back and schedule an appt. [place]

## 2014-11-01 ENCOUNTER — Other Ambulatory Visit: Payer: 59

## 2014-11-01 ENCOUNTER — Encounter: Payer: Self-pay | Admitting: Adult Health

## 2014-11-01 ENCOUNTER — Ambulatory Visit (INDEPENDENT_AMBULATORY_CARE_PROVIDER_SITE_OTHER): Payer: 59 | Admitting: Adult Health

## 2014-11-01 VITALS — BP 120/88 | HR 60 | Temp 97.5°F | Ht 64.0 in | Wt 129.5 lb

## 2014-11-01 DIAGNOSIS — R300 Dysuria: Secondary | ICD-10-CM

## 2014-11-01 DIAGNOSIS — N898 Other specified noninflammatory disorders of vagina: Secondary | ICD-10-CM | POA: Diagnosis not present

## 2014-11-01 DIAGNOSIS — R319 Hematuria, unspecified: Secondary | ICD-10-CM | POA: Diagnosis not present

## 2014-11-01 HISTORY — DX: Dysuria: R30.0

## 2014-11-01 LAB — POCT URINALYSIS DIPSTICK
GLUCOSE UA: NEGATIVE
NITRITE UA: NEGATIVE
Protein, UA: NEGATIVE

## 2014-11-01 MED ORDER — NITROFURANTOIN MONOHYD MACRO 100 MG PO CAPS: 100.0000 mg | ORAL_CAPSULE | Freq: Two times a day (BID) | ORAL | Status: DC

## 2014-11-01 MED ORDER — ESTRADIOL 0.1 MG/GM VA CREA: TOPICAL_CREAM | VAGINAL | Status: DC

## 2014-11-01 NOTE — Progress Notes (Signed)
Subjective:     Patient ID: Kylie Duffy, female   DOB: [birth year], 59 y.o.   MRN: [id number]  HPI Vivi is a 59 year old white female in complaining of burning with urination and some frequency and vaginal dryness, sex hurt last time she tried.  Review of Systems Patient denies any headaches, hearing loss, fatigue, blurred vision, shortness of breath, chest pain, abdominal pain, problems with bowel movements, No joint pain or mood swings.See HPI for positives. Reviewed past medical,surgical, social and family history. Reviewed medications and allergies.     Objective:   Physical Exam BP 120/88 mmHg  Pulse 60  Temp(Src) 97.5 F (36.4 C)  Ht 5\' 4"  (1.626 m)  Wt 129 lb 8 oz (58.741 kg)  BMI 22.22 kg/m2 urine 1+ blood and 1+ leuks, No CVAT,Skin warm and dry.Pelvic: external genitalia is normal in appearance no lesions, some redness in crease of leg, vagina: has decreased moisture and rugae, tissues thin and red, adnexa: no masses or tenderness noted. Bladder is non tender and no masses felt. Face time 15 minutes, discussed trying some vaginal estrogen and she agrees.    Assessment:     Burning with urination Hematuria Vaginal dryness    Plan:     Rx macrobid 1 bid x 7 days UA C&S sent Push water  Trial estrace vaginal cream 3 tubes given lot [id number] exp 3/19, use pea sized amount on vaginal for next 10 days then 2-3 x a week Use astro glide with sex Follow up prn

## 2014-11-01 NOTE — Patient Instructions (Signed)
Push fluids  Keep appt

## 2014-11-02 LAB — MICROSCOPIC EXAMINATION
Casts: NONE SEEN /lpf
Epithelial Cells (non renal): NONE SEEN /hpf (ref 0–10)

## 2014-11-02 LAB — URINALYSIS, ROUTINE W REFLEX MICROSCOPIC
BILIRUBIN UA: NEGATIVE
Glucose, UA: NEGATIVE
Ketones, UA: NEGATIVE
LEUKOCYTES UA: NEGATIVE
Nitrite, UA: NEGATIVE
PROTEIN UA: NEGATIVE
SPEC GRAV UA: 1.016 (ref 1.005–1.030)
Urobilinogen, Ur: 0.2 mg/dL (ref 0.2–1.0)
pH, UA: 7.5 (ref 5.0–7.5)

## 2014-11-03 LAB — URINE CULTURE

## 2014-11-06 ENCOUNTER — Telehealth: Payer: Self-pay | Admitting: Adult Health

## 2014-11-06 MED ORDER — SULFAMETHOXAZOLE-TRIMETHOPRIM 800-160 MG PO TABS: 1.0000 | ORAL_TABLET | Freq: Two times a day (BID) | ORAL | Status: DC

## 2014-11-06 NOTE — Telephone Encounter (Signed)
Pt aware of culture will rx septra ds,is feeling better with macrobid

## 2014-12-27 ENCOUNTER — Other Ambulatory Visit: Payer: 59 | Admitting: Adult Health

## 2015-01-03 ENCOUNTER — Encounter: Payer: 59 | Admitting: Adult Health

## 2015-01-04 NOTE — Progress Notes (Signed)
This encounter was created in error - please disregard.

## 2015-01-31 ENCOUNTER — Ambulatory Visit (INDEPENDENT_AMBULATORY_CARE_PROVIDER_SITE_OTHER): Payer: 59 | Admitting: Adult Health

## 2015-01-31 ENCOUNTER — Encounter: Payer: Self-pay | Admitting: Adult Health

## 2015-01-31 VITALS — BP 120/72 | HR 78 | Ht 63.0 in | Wt 133.0 lb

## 2015-01-31 DIAGNOSIS — Z01419 Encounter for gynecological examination (general) (routine) without abnormal findings: Secondary | ICD-10-CM

## 2015-01-31 DIAGNOSIS — N898 Other specified noninflammatory disorders of vagina: Secondary | ICD-10-CM

## 2015-01-31 DIAGNOSIS — Z1212 Encounter for screening for malignant neoplasm of rectum: Secondary | ICD-10-CM | POA: Diagnosis not present

## 2015-01-31 LAB — HEMOCCULT GUIAC POC 1CARD (OFFICE): FECAL OCCULT BLD: NEGATIVE

## 2015-01-31 NOTE — Progress Notes (Signed)
Patient ID: Kylie Duffy, female   DOB: 06/26/1955, 60 y.o.   MRN: [id number] History of Present Illness: Kylie Duffy is a 60 year old white female, married in for a well woman gyn exam.No complaints, she got flu shot at work and labs with PCP, Dr Buelah Manis.    Current Medications, Allergies, Past Medical History, Past Surgical History, Family History and Social History were reviewed in [facility] record.     Review of Systems: Patient denies any headaches, hearing loss, fatigue, blurred vision, shortness of breath, chest pain, abdominal pain, problems with bowel movements, urination, or intercourse. No joint pain or mood swings.    Physical Exam:BP 120/72 mmHg  Pulse 78  Ht 5\' 3"  (1.6 m)  Wt 133 lb (60.328 kg)  BMI 23.57 kg/m2 General:  Well developed, well nourished, no acute distress Skin:  Warm and dry Neck:  Midline trachea, normal thyroid, good ROM, no lymphadenopathy Lungs; Clear to auscultation bilaterally Breast:  No dominant palpable mass, retraction, or nipple discharge, has mole/AK right breast Cardiovascular: Regular rate and rhythm Abdomen:  Soft, non tender, no hepatosplenomegaly Pelvic:  External genitalia is normal in appearance, no lesions.  The vagina has decreased colo,moisture and rugae and has lost elasticity,due to atrophy. Urethra has no lesions or masses. The cervix and uterus are absent.  No adnexal masses or tenderness noted.Bladder is non tender, no masses felt. Rectal: Good sphincter tone, no polyps, or hemorrhoids felt.  Hemoccult negative. Extremities/musculoskeletal:  No swelling or varicosities noted, no clubbing or cyanosis Psych:  No mood changes, alert and cooperative,seems happy   Impression: Well woman gyn exam no pap Vaginal dryness     Plan: Continue estrace cream Mammogram yearly Physical in 1 year Labs with PCP

## 2015-01-31 NOTE — Patient Instructions (Signed)
Mammogram now and yearly Physical in 1 year Labs with PCP

## 2015-02-07 ENCOUNTER — Other Ambulatory Visit: Payer: Self-pay

## 2015-02-07 DIAGNOSIS — Z1231 Encounter for screening mammogram for malignant neoplasm of breast: Secondary | ICD-10-CM

## 2015-02-09 ENCOUNTER — Ambulatory Visit
Admission: RE | Admit: 2015-02-09 | Discharge: 2015-02-09 | Disposition: A | Discharge status: Home / Self Care | Payer: 59 | Source: Ambulatory Visit

## 2015-02-09 DIAGNOSIS — Z1231 Encounter for screening mammogram for malignant neoplasm of breast: Secondary | ICD-10-CM

## 2015-02-12 ENCOUNTER — Other Ambulatory Visit: Payer: Self-pay | Admitting: Obstetrics and Gynecology

## 2015-02-12 DIAGNOSIS — R928 Other abnormal and inconclusive findings on diagnostic imaging of breast: Secondary | ICD-10-CM

## 2015-02-16 ENCOUNTER — Other Ambulatory Visit: Payer: Self-pay | Admitting: Obstetrics and Gynecology

## 2015-02-16 ENCOUNTER — Ambulatory Visit
Admission: RE | Admit: 2015-02-16 | Discharge: 2015-02-16 | Disposition: A | Discharge status: Home / Self Care | Payer: 59 | Source: Ambulatory Visit | Attending: Obstetrics and Gynecology | Admitting: Obstetrics and Gynecology

## 2015-02-16 DIAGNOSIS — R928 Other abnormal and inconclusive findings on diagnostic imaging of breast: Secondary | ICD-10-CM

## 2015-02-16 DIAGNOSIS — N63 Unspecified lump in breast: Secondary | ICD-10-CM | POA: Diagnosis not present

## 2015-02-16 DIAGNOSIS — N6320 Unspecified lump in the left breast, unspecified quadrant: Secondary | ICD-10-CM

## 2015-03-08 ENCOUNTER — Other Ambulatory Visit: Payer: Self-pay | Admitting: Obstetrics and Gynecology

## 2015-03-08 DIAGNOSIS — N6320 Unspecified lump in the left breast, unspecified quadrant: Secondary | ICD-10-CM

## 2015-03-09 ENCOUNTER — Ambulatory Visit
Admission: RE | Admit: 2015-03-09 | Discharge: 2015-03-09 | Disposition: A | Discharge status: Home / Self Care | Payer: 59 | Source: Ambulatory Visit | Attending: Obstetrics and Gynecology | Admitting: Obstetrics and Gynecology

## 2015-03-09 DIAGNOSIS — D242 Benign neoplasm of left breast: Secondary | ICD-10-CM | POA: Diagnosis not present

## 2015-03-09 DIAGNOSIS — N6320 Unspecified lump in the left breast, unspecified quadrant: Secondary | ICD-10-CM

## 2015-03-09 DIAGNOSIS — N63 Unspecified lump in breast: Secondary | ICD-10-CM | POA: Diagnosis not present

## 2015-05-07 ENCOUNTER — Encounter: Payer: Self-pay | Admitting: Family Medicine

## 2015-05-07 ENCOUNTER — Ambulatory Visit (INDEPENDENT_AMBULATORY_CARE_PROVIDER_SITE_OTHER): Payer: 59 | Admitting: Family Medicine

## 2015-05-07 VITALS — BP 122/66 | HR 82 | Temp 98.5°F | Resp 16 | Ht 63.0 in | Wt 127.0 lb

## 2015-05-07 DIAGNOSIS — J00 Acute nasopharyngitis [common cold]: Secondary | ICD-10-CM | POA: Diagnosis not present

## 2015-05-07 DIAGNOSIS — Z0000 Encounter for general adult medical examination without abnormal findings: Secondary | ICD-10-CM | POA: Diagnosis not present

## 2015-05-07 DIAGNOSIS — Z1159 Encounter for screening for other viral diseases: Secondary | ICD-10-CM | POA: Diagnosis not present

## 2015-05-07 DIAGNOSIS — M8580 Other specified disorders of bone density and structure, unspecified site: Secondary | ICD-10-CM | POA: Diagnosis not present

## 2015-05-07 DIAGNOSIS — G43109 Migraine with aura, not intractable, without status migrainosus: Secondary | ICD-10-CM

## 2015-05-07 MED ORDER — SUMATRIPTAN SUCCINATE 100 MG PO TABS: 100.0000 mg | ORAL_TABLET | ORAL | Status: DC | PRN

## 2015-05-07 NOTE — Assessment & Plan Note (Signed)
Continue imitrex, uses sparingly

## 2015-05-07 NOTE — Patient Instructions (Signed)
Muicnex DM if needed for cough Nasal saline  Throat lozenges  Continue supplements  Plan for Bone Density in December

## 2015-05-07 NOTE — Progress Notes (Signed)
Patient ID: Kylie Duffy, female   DOB: 1955-12-22, 60 y.o.   MRN: [id number]    Subjective:    Patient ID: Kylie Duffy, female    DOB: Mar 11, 1955, 60 y.o.   MRN: [id number]  Patient presents for CPE Patient for complete physical exam. She does have a gynecologist. 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 She is supposed to have a repeat ultrasound in August  She had a bone density done in December 2015 at that time showed worsening osteopenia we consider Fosamax but she wants to hold off. She is on calcium and vitamin D.  She does not require Pap smear  Runny nose, mild sore throat, for past 2 days, +sick contact with husband, used herbal tea. No fever, minimal cough, feels a little tired more than usual  Review Of Systems:  GEN-+ fatigue, fever, weight loss,weakness, recent illness HEENT- denies eye drainage, change in vision,+ nasal discharge, CVS- denies chest pain, palpitations RESP- denies SOB, cough, wheeze ABD- denies N/V, change in stools, abd pain GU- denies dysuria, hematuria, dribbling, incontinence MSK- denies joint pain, muscle aches, injury Neuro- denies headache, dizziness, syncope, seizure activity       Objective:    BP 122/66 mmHg  Pulse 82  Temp(Src) 98.5 F (36.9 C) (Oral)  Resp 16  Ht 5\' 3"  (1.6 m)  Wt 127 lb (57.607 kg)  BMI 22.50 kg/m2 GEN- NAD, alert and oriented x3 HEENT- PERRL, EOMI, non injected sclera, pink conjunctiva, MMM, oropharynx clear, clear rhinorrhea  Neck- Supple, no thyromegaly , no LAD CVS- RRR, no murmur RESP-CTAB ABD-NABS,soft,NT,ND EXT- No edema Pulses- Radial, DP- 2+        Assessment & Plan:      Problem List Items Addressed This Visit    Osteopenia    Continue calcium and vitamin D Bone Density in Dec       Relevant Orders   Vitamin D, 25-hydroxy   Migraines    Continue imitrex, uses sparingly      Relevant Medications   SUMAtriptan (IMITREX) 100 MG tablet    Other Visit Diagnoses    Routine general medical examination at a health care facility    -  Primary    CPE done, hepatitis C screen, had blood tranfusion >30 years ago. Plan for Bone Density in Dec 2017, fasting labs today    Relevant Orders    CBC with Differential/Platelet    Comprehensive metabolic panel    Lipid panel    TSH    Need for hepatitis C screening test        Relevant Orders    Hepatitis C Antibody    Common cold        Viral URI at this point, supportive care, OTC meds call if not improved by Friday       Note: This dictation was prepared with Dragon dictation along with smaller phrase technology. Any transcriptional errors that result from this process are unintentional.

## 2015-05-07 NOTE — Assessment & Plan Note (Signed)
Continue calcium and vitamin D Bone Density in Dec

## 2015-05-08 LAB — CBC WITH DIFFERENTIAL/PLATELET
BASOS ABS: 0 {cells}/uL (ref 0–200)
Basophils Relative: 0 %
EOS PCT: 2 %
Eosinophils Absolute: 146 cells/uL (ref 15–500)
HEMATOCRIT: 40.9 % (ref 35.0–45.0)
Hemoglobin: 13.2 g/dL (ref 12.0–15.0)
LYMPHS PCT: 19 %
Lymphs Abs: 1387 cells/uL (ref 850–3900)
MCH: 29.3 pg (ref 27.0–33.0)
MCHC: 32.3 g/dL (ref 32.0–36.0)
MCV: 90.9 fL (ref 80.0–100.0)
MPV: 9.7 fL (ref 7.5–12.5)
Monocytes Absolute: 584 cells/uL (ref 200–950)
Monocytes Relative: 8 %
NEUTROS PCT: 71 %
Neutro Abs: 5183 cells/uL (ref 1500–7800)
Platelets: 423 10*3/uL — ABNORMAL HIGH (ref 140–400)
RBC: 4.5 MIL/uL (ref 3.80–5.10)
RDW: 14.2 % (ref 11.0–15.0)
WBC: 7.3 10*3/uL (ref 3.8–10.8)

## 2015-05-08 LAB — VITAMIN D 25 HYDROXY (VIT D DEFICIENCY, FRACTURES): Vit D, 25-Hydroxy: 28 ng/mL — ABNORMAL LOW (ref 30–100)

## 2015-05-08 LAB — COMPREHENSIVE METABOLIC PANEL
ALBUMIN: 4.2 g/dL (ref 3.6–5.1)
ALT: 9 U/L (ref 6–29)
AST: 16 U/L (ref 10–35)
Alkaline Phosphatase: 63 U/L (ref 33–130)
BILIRUBIN TOTAL: 0.4 mg/dL (ref 0.2–1.2)
BUN: 17 mg/dL (ref 7–25)
CALCIUM: 9 mg/dL (ref 8.6–10.4)
CHLORIDE: 101 mmol/L (ref 98–110)
CO2: 25 mmol/L (ref 20–31)
Creat: 0.81 mg/dL (ref 0.50–1.05)
GLUCOSE: 84 mg/dL (ref 70–99)
Potassium: 4.5 mmol/L (ref 3.5–5.3)
Sodium: 137 mmol/L (ref 135–146)
Total Protein: 6.9 g/dL (ref 6.1–8.1)

## 2015-05-08 LAB — LIPID PANEL
CHOL/HDL RATIO: 3.7 ratio (ref <=5.0)
Cholesterol: 224 mg/dL — ABNORMAL HIGH (ref 125–200)
HDL: 61 mg/dL (ref >=46)
LDL CALC: 148 mg/dL — AB (ref <130)
TRIGLYCERIDES: 76 mg/dL (ref <150)
VLDL: 15 mg/dL (ref <30)

## 2015-05-08 LAB — HEPATITIS C ANTIBODY: HCV Ab: NEGATIVE

## 2015-05-08 LAB — TSH: TSH: 2.26 m[IU]/L

## 2015-06-07 ENCOUNTER — Encounter: Payer: Self-pay | Admitting: Physician Assistant

## 2015-06-07 ENCOUNTER — Ambulatory Visit (INDEPENDENT_AMBULATORY_CARE_PROVIDER_SITE_OTHER): Payer: 59 | Admitting: Physician Assistant

## 2015-06-07 VITALS — BP 120/68 | HR 72 | Temp 98.9°F | Resp 16 | Ht 63.0 in | Wt 123.0 lb

## 2015-06-07 DIAGNOSIS — B349 Viral infection, unspecified: Secondary | ICD-10-CM | POA: Diagnosis not present

## 2015-06-07 DIAGNOSIS — J988 Other specified respiratory disorders: Secondary | ICD-10-CM

## 2015-06-07 DIAGNOSIS — B9789 Other viral agents as the cause of diseases classified elsewhere: Secondary | ICD-10-CM

## 2015-06-07 DIAGNOSIS — J029 Acute pharyngitis, unspecified: Secondary | ICD-10-CM

## 2015-06-07 LAB — STREP GROUP A AG, W/REFLEX TO CULT: STREGTOCOCCUS GROUP A AG SCREEN: NOT DETECTED

## 2015-06-07 NOTE — Progress Notes (Signed)
    Patient ID: Kylie Duffy MRN: [id number], DOB: August 24, 1955, 60 y.o. Date of Encounter: 06/07/2015, 1:49 PM    Chief Complaint:  Chief Complaint  Patient presents with  . Illness    x3 days- sore throat, fatigue, malaise, fever/ chills, productive cough with yellow mucus     HPI: 60 y.o. year old white female presents with above.   Says symptoms started afternoon of Tuesday 06/05/2015. Started with sore throat. Since then, has also developed fatigue, malaise, some cough. No documented/checked fever. No known sick contacts. Throat still a little sore.      Home Meds:   Outpatient Prescriptions Prior to Visit  Medication Sig Dispense Refill  . Calcium Carbonate-Vitamin D (CALCIUM 600 + D PO) Take 1 tablet by mouth daily.    . Multiple Vitamin (MULITIVITAMIN WITH MINERALS) TABS Take 1 tablet by mouth daily.    . Red Yeast Rice Extract (RED YEAST RICE PO) Take by mouth as needed.    . SUMAtriptan (IMITREX) 100 MG tablet Take 1 tablet (100 mg total) by mouth every 2 (two) hours as needed for migraine or headache. May repeat in 2 hours if headache persists or recurs. 10 tablet 1  . Vitamin D, Cholecalciferol, 400 units CAPS Take by mouth.     No facility-administered medications prior to visit.    Allergies: No Known Allergies    Review of Systems: See HPI for pertinent ROS. All other ROS negative.    Physical Exam: Blood pressure 120/68, pulse 72, temperature 98.9 F (37.2 C), temperature source Oral, resp. rate 16, height 5\' 3"  (1.6 m), weight 123 lb (55.792 kg)., Body mass index is 21.79 kg/(m^2). General:  WNWD WF. Appears in no acute distress. HEENT: Normocephalic, atraumatic, eyes without discharge, sclera non-icteric, nares are without discharge. Bilateral auditory canals clear, TM's are without perforation, pearly grey and translucent with reflective cone of light bilaterally. Oral cavity moist, posterior pharynx with mild erythema, no exudate, no peritonsillar  abscess.  Neck: Supple. No thyromegaly. No lymphadenopathy. Lungs: Clear bilaterally to auscultation without wheezes, rales, or rhonchi. Breathing is unlabored. Heart: Regular rhythm. No murmurs, rubs, or gallops. Msk:  Strength and tone normal for age. Extremities/Skin: Warm and dry.  No rashes. Neuro: Alert and oriented X 3. Moves all extremities spontaneously. Gait is normal. CNII-XII grossly in tact. Psych:  Responds to questions appropriately with a normal affect.   Results for orders placed or performed in visit on 06/07/15  STREP GROUP A AG, W/REFLEX TO CULT  Result Value Ref Range   SOURCE THROAT    STREGTOCOCCUS GROUP A AG SCREEN Not Detected      ASSESSMENT AND PLAN:  60 y.o. year old female with  1. Viral pharyngitis Symptomatic Treatment. Lozenges, spray for sore throat.   2. Viral respiratory infection Symptomatic Treatment. Lozenges, spray for sore throat.  Tylenol, Motrin, decongestant, cough meds as needed.  If symptoms worsen significantly, develops fever, call for f/u.  Or, if symptoms persist 7 - 10 days, f/u.   3. Sore throat - STREP GROUP A AG, W/REFLEX TO CULT   Signed, [address] [place], [place], [facility] 06/07/2015 1:49 PM

## 2015-06-08 ENCOUNTER — Telehealth: Payer: Self-pay | Admitting: Family Medicine

## 2015-06-08 MED ORDER — AZITHROMYCIN 250 MG PO TABS
ORAL_TABLET | ORAL | Status: DC
Start: 2015-06-08 — End: 2016-02-07

## 2015-06-08 MED ORDER — MAGIC MOUTHWASH W/LIDOCAINE: 5.0000 mL | Freq: Three times a day (TID) | ORAL | Status: DC | PRN

## 2015-06-08 MED FILL — AZITHROMYCIN 250 MG TABLET: 250 | 5 days supply | Qty: 6 | Fill #0

## 2015-06-08 MED FILL — MAGIC MOUTHWASH BOP FORM: 12 days supply | Qty: 180 | Fill #0

## 2015-06-08 NOTE — Telephone Encounter (Signed)
Prescription sent to pharmacy. .   Call placed to patient and patient made aware.  

## 2015-06-08 NOTE — Telephone Encounter (Signed)
MD please advise

## 2015-06-08 NOTE — Telephone Encounter (Signed)
If sore throat call in magic mouthwash, she can also take tylenol/motrin Send over zpak, advise this early most likley viral so I would recommend waiting at least until Monday before starting

## 2015-06-08 NOTE — Telephone Encounter (Signed)
[facility]   Patient calling to say that she is not feeling any better from her visit with Olean Ree, she had told her if she did not feel better to call and possibly something could be called in for her  [phone]  She would like something today if possible because of the weekend

## 2015-06-09 LAB — CULTURE, GROUP A STREP: Organism ID, Bacteria: NORMAL

## 2015-07-10 DIAGNOSIS — T300 Burn of unspecified body region, unspecified degree: Secondary | ICD-10-CM | POA: Diagnosis not present

## 2015-07-10 DIAGNOSIS — L258 Unspecified contact dermatitis due to other agents: Secondary | ICD-10-CM | POA: Diagnosis not present

## 2015-07-25 DIAGNOSIS — G43109 Migraine with aura, not intractable, without status migrainosus: Secondary | ICD-10-CM | POA: Diagnosis not present

## 2015-08-08 DIAGNOSIS — H52203 Unspecified astigmatism, bilateral: Secondary | ICD-10-CM | POA: Diagnosis not present

## 2015-08-08 DIAGNOSIS — H524 Presbyopia: Secondary | ICD-10-CM | POA: Diagnosis not present

## 2015-08-08 DIAGNOSIS — H5213 Myopia, bilateral: Secondary | ICD-10-CM | POA: Diagnosis not present

## 2015-08-17 ENCOUNTER — Other Ambulatory Visit: Payer: Self-pay | Admitting: Obstetrics and Gynecology

## 2015-08-17 DIAGNOSIS — N6320 Unspecified lump in the left breast, unspecified quadrant: Secondary | ICD-10-CM

## 2015-09-07 ENCOUNTER — Ambulatory Visit
Admission: RE | Admit: 2015-09-07 | Discharge: 2015-09-07 | Disposition: A | Discharge status: Home / Self Care | Payer: 59 | Source: Ambulatory Visit | Attending: Obstetrics and Gynecology | Admitting: Obstetrics and Gynecology

## 2015-09-07 DIAGNOSIS — N63 Unspecified lump in breast: Secondary | ICD-10-CM | POA: Diagnosis not present

## 2015-09-07 DIAGNOSIS — N6320 Unspecified lump in the left breast, unspecified quadrant: Secondary | ICD-10-CM

## 2015-11-21 ENCOUNTER — Encounter: Payer: Self-pay | Admitting: Family Medicine

## 2015-11-21 MED ORDER — SUMATRIPTAN SUCCINATE 100 MG PO TABS: 100.0000 mg | ORAL_TABLET | ORAL | 1 refills | Status: DC | PRN

## 2015-11-21 MED FILL — SUMATRIPTAN SUCC 100 MG TAB: 100 | 30 days supply | Qty: 9 | Fill #0

## 2015-12-12 DIAGNOSIS — Z1283 Encounter for screening for malignant neoplasm of skin: Secondary | ICD-10-CM | POA: Diagnosis not present

## 2015-12-12 DIAGNOSIS — L820 Inflamed seborrheic keratosis: Secondary | ICD-10-CM | POA: Diagnosis not present

## 2016-01-04 ENCOUNTER — Other Ambulatory Visit: Payer: Self-pay | Admitting: Adult Health

## 2016-01-04 DIAGNOSIS — D242 Benign neoplasm of left breast: Secondary | ICD-10-CM

## 2016-02-07 ENCOUNTER — Ambulatory Visit (INDEPENDENT_AMBULATORY_CARE_PROVIDER_SITE_OTHER): Payer: 59 | Admitting: Adult Health

## 2016-02-07 ENCOUNTER — Encounter: Payer: Self-pay | Admitting: Adult Health

## 2016-02-07 VITALS — BP 116/70 | HR 74 | Ht 63.0 in | Wt 133.0 lb

## 2016-02-07 DIAGNOSIS — N898 Other specified noninflammatory disorders of vagina: Secondary | ICD-10-CM

## 2016-02-07 DIAGNOSIS — Z01411 Encounter for gynecological examination (general) (routine) with abnormal findings: Secondary | ICD-10-CM

## 2016-02-07 DIAGNOSIS — Z1212 Encounter for screening for malignant neoplasm of rectum: Secondary | ICD-10-CM | POA: Diagnosis not present

## 2016-02-07 DIAGNOSIS — Z1211 Encounter for screening for malignant neoplasm of colon: Secondary | ICD-10-CM

## 2016-02-07 DIAGNOSIS — Z01419 Encounter for gynecological examination (general) (routine) without abnormal findings: Secondary | ICD-10-CM

## 2016-02-07 LAB — HEMOCCULT GUIAC POC 1CARD (OFFICE): FECAL OCCULT BLD: NEGATIVE

## 2016-02-07 NOTE — Progress Notes (Signed)
Patient ID: Kylie Duffy, female   DOB: December 14, 1955, 61 y.o.   MRN: [id number] History of Present Illness: Kylie Duffy is a 61 year old white female, married in for well woman gyn exam, she is sp hysterectomy. PCP is Dr Kylie Duffy.   Current Medications, Allergies, Past Medical History, Past Surgical History, Family History and Social History were reviewed in [facility] record.     Review of Systems: Patient denies any daily headaches, hearing loss, fatigue, blurred vision, shortness of breath, chest pain, abdominal pain, problems with bowel movements, urination, or intercourse(not currently having sex). No joint pain or mood swings.   Physical Exam:BP 116/70 (BP Location: Left Arm, Patient Position: Sitting, Cuff Size: Normal)   Pulse 74   Ht 5\' 3"  (1.6 m)   Wt 133 lb (60.3 kg)   BMI 23.56 kg/m  General:  Well developed, well nourished, no acute distress Skin:  Warm and dry Neck:  Midline trachea, normal thyroid, good ROM, no lymphadenopathy Lungs; Clear to auscultation bilaterally Breast:  No dominant palpable mass, retraction, or nipple discharge Cardiovascular: Regular rate and rhythm Abdomen:  Soft, non tender, no hepatosplenomegaly Pelvic:  External genitalia is normal in appearance, no lesions.  The vagina is pale with loss of moisture and rugae. Urethra has no lesions or masses. The cervix and uterus are absent.  No adnexal masses or tenderness noted.Bladder is non tender, no masses felt. Rectal: Good sphincter tone, no polyps, or hemorrhoids felt.  Hemoccult negative. Extremities/musculoskeletal:  No swelling or varicosities noted, no clubbing or cyanosis Psych:  No mood changes, alert and cooperative,seems happy PHQ 2 score  Impression: 1. Well woman exam with routine gynecological exam   2. Vaginal dryness   3. Screening for colorectal cancer       Plan: Physical in 1 year Mammogram yearly Labs with PCP Colonoscopy per GI Try luvena and astro glide

## 2016-02-07 NOTE — Patient Instructions (Addendum)
Physical in 1 year Mammogram yearly Labs with PCP Colonoscopy per GI

## 2016-02-22 ENCOUNTER — Ambulatory Visit
Admission: RE | Admit: 2016-02-22 | Discharge: 2016-02-22 | Disposition: A | Discharge status: Home / Self Care | Payer: 59 | Source: Ambulatory Visit | Attending: Adult Health | Admitting: Adult Health

## 2016-02-22 ENCOUNTER — Other Ambulatory Visit: Payer: Self-pay | Admitting: Adult Health

## 2016-02-22 DIAGNOSIS — Z1231 Encounter for screening mammogram for malignant neoplasm of breast: Secondary | ICD-10-CM

## 2016-02-22 DIAGNOSIS — D242 Benign neoplasm of left breast: Secondary | ICD-10-CM

## 2016-02-26 ENCOUNTER — Ambulatory Visit (INDEPENDENT_AMBULATORY_CARE_PROVIDER_SITE_OTHER): Payer: 59

## 2016-02-26 ENCOUNTER — Ambulatory Visit (INDEPENDENT_AMBULATORY_CARE_PROVIDER_SITE_OTHER): Payer: 59 | Admitting: Orthopedic Surgery

## 2016-02-26 ENCOUNTER — Encounter: Payer: Self-pay | Admitting: Orthopedic Surgery

## 2016-02-26 VITALS — BP 144/88 | HR 79 | Ht 63.0 in | Wt 134.0 lb

## 2016-02-26 DIAGNOSIS — S93401A Sprain of unspecified ligament of right ankle, initial encounter: Secondary | ICD-10-CM | POA: Diagnosis not present

## 2016-02-26 DIAGNOSIS — IMO0001 Reserved for inherently not codable concepts without codable children: Secondary | ICD-10-CM

## 2016-02-26 DIAGNOSIS — M25571 Pain in right ankle and joints of right foot: Secondary | ICD-10-CM

## 2016-02-26 NOTE — Patient Instructions (Addendum)
Ankle Sprain Introduction An ankle sprain is a stretch or tear in one of the tough tissues (ligaments) in your ankle. Follow these instructions at home:  Rest your ankle.  Take over-the-counter and prescription medicines only as told by your doctor.  For 2-3 days, keep your ankle higher than the level of your heart (elevated) as much as possible.  If directed, put ice on the area:  Put ice in a plastic bag.  Place a towel between your skin and the bag.  Leave the ice on for 20 minutes, 2-3 times a day.  If you were given a brace:  Wear it as told.  Take it off to shower or bathe.  Try not to move your ankle much, but wiggle your toes from time to time. This helps to prevent swelling.  If you were given an elastic bandage (dressing):  Take it off when you shower or bathe.  Try not to move your ankle much, but wiggle your toes from time to time. This helps to prevent swelling.  Adjust the bandage to make it more comfortable if it feels too tight.  Loosen the bandage if you lose feeling in your foot, your foot tingles, or your foot gets cold and blue.  If you have crutches, use them as told by your doctor. Continue to use them until you can walk without feeling pain in your ankle. Contact a doctor if:  Your bruises or swelling are quickly getting worse.  Your pain does not get better after you take medicine. Get help right away if:  You cannot feel your toes or foot.  Your toes or your foot looks blue.  You have very bad pain that gets worse. This information is not intended to replace advice given to you by your health care provider. Make sure you discuss any questions you have with your health care provider. Document Released: 07/02/2007 Document Revised: 06/21/2015 Document Reviewed: 08/15/2014  2017 Elsevier  Ankle Sprain, Phase I Rehab Start Post injury day 10  Do 3 sets of 10 each exercise were indicated hold for 1 count 2 exercises once a day Ask your  health care provider which exercises are safe for you. Do exercises exactly as told by your health care provider and adjust them as directed. It is normal to feel mild stretching, pulling, tightness, or discomfort as you do these exercises, but you should stop right away if you feel sudden pain or your pain gets worse.Do not begin these exercises until told by your health care provider. Stretching and range of motion exercises These exercises warm up your muscles and joints and improve the movement and flexibility of your lower leg and ankle. These exercises also help to relieve pain and stiffness. Exercise A: Gastroc and soleus stretch 1. Sit on the floor with your left / right leg extended. 2. Loop a belt or towel around the ball of your left / right foot. The ball of your foot is on the walking surface, right under your toes. 3. Keep your left / right ankle and foot relaxed and keep your knee straight while you use the belt or towel to pull your foot toward you. You should feel a gentle stretch behind your calf or knee. 4. Hold this position for __________ seconds, then release to the starting position. Repeat the exercise with your knee bent. You can put a pillow or a rolled bath towel under your knee to support it. You should feel a stretch deep in your calf  or at your Achilles tendon. Repeat each stretch __________ times. Complete these stretches __________ times a day. Exercise B: Ankle alphabet 1. Sit with your left / right leg supported at the lower leg.  Do not rest your foot on anything.  Make sure your foot has room to move freely. 2. Think of your left / right foot as a paintbrush, and move your foot to trace each letter of the alphabet in the air. Keep your hip and knee still while you trace. Make the letters as large as you can without feeling discomfort. 3. Trace every letter from A to Z. Repeat __________ times. Complete this exercise __________ times a day. Strengthening  exercises These exercises build strength and endurance in your ankle and lower leg. Endurance is the ability to use your muscles for a long time, even after they get tired. Exercise C: Dorsiflexors 1. Secure a rubber exercise band or tube to an object, such as a table leg, that will stay still when the band is pulled. Secure the other end around your left / right foot. 2. Sit on the floor facing the object, with your left / right leg extended. The band or tube should be slightly tense when your foot is relaxed. 3. Slowly bring your foot toward you, pulling the band tighter. 4. Hold this position for __________ seconds. 5. Slowly return your foot to the starting position. Repeat __________ times. Complete this exercise __________ times a day. Exercise D: Plantar flexors 1. Sit on the floor with your left / right leg extended. 2. Loop a rubber exercise tube or band around the ball of your left / right foot. The ball of your foot is on the walking surface, right under your toes.  Hold the ends of the band or tube in your hands.  The band or tube should be slightly tense when your foot is relaxed. 3. Slowly point your foot and toes downward, pushing them away from you. 4. Hold this position for __________ seconds. 5. Slowly return your foot to the starting position. Repeat __________ times. Complete this exercise __________ times a day. Exercise E: Evertors 1. Sit on the floor with your legs straight out in front of you. 2. Loop a rubber exercise band or tube around the ball of your left / right foot. The ball of your foot is on the walking surface, right under your toes.  Hold the ends of the band in your hands, or secure the band to a stable object.  The band or tube should be slightly tense when your foot is relaxed. 3. Slowly push your foot outward, away from your other leg. 4. Hold this position for __________ seconds. 5. Slowly return your foot to the starting position. Repeat  __________ times. Complete this exercise __________ times a day. This information is not intended to replace advice given to you by your health care provider. Make sure you discuss any questions you have with your health care provider. Document Released: 08/14/2004 Document Revised: 09/20/2015 Document Reviewed: 11/27/2014 Elsevier Interactive Patient Education  2017 [employer].

## 2016-02-26 NOTE — Progress Notes (Signed)
Patient ID: Kylie Duffy, female   DOB: Oct 17, 1955, 61 y.o.   MRN: [id number]  Chief Complaint  Patient presents with  . Ankle Pain    RT ANKLE PAIN S/P FALL 02/26/16    HPI Kylie Duffy is a 61 y.o. female.   HPI [place] down the stairs this morning did not feel a pop but complains of painful weightbearing swelling lateral side of the right ankle  Review of Systems Review of Systems Normal neuro  Denies fever  Examination BP (!) 144/88   Pulse 79   Ht 5\' 3"  (1.6 m)   Wt 134 lb (60.8 kg)   BMI 23.74 kg/m   Gen. appearance the patient's appearance is normal with normal grooming and  hygiene The patient is oriented to person place and time Mood and affect are normal   Ortho Exam Gait is remarkable for limping on the right side Inspection reveals large soft tissue swelling lateral side of the ankle over the anterior talofibular ligament ROM is limited by pain Stability tests are normal  Motor exam 5/5 manual muscle testing , no atrophy  Skin mild abrasion over the distal to mid tibia Medical decision-making Diagnosis, Data, Plan (risk)  X-ray negative for fracture or soft tissue swelling seen  Right ankle sprain  Recommend ASO 48 hours of ice followed by contrast baths 1 week weight-bear as tolerated limit walking to the necessity only  Arther Abbott, MD 02/26/2016 10:58 AM

## 2016-04-09 ENCOUNTER — Encounter: Payer: Self-pay | Admitting: Adult Health

## 2016-04-09 ENCOUNTER — Ambulatory Visit (INDEPENDENT_AMBULATORY_CARE_PROVIDER_SITE_OTHER): Payer: 59 | Admitting: Adult Health

## 2016-04-09 VITALS — BP 114/64 | HR 72 | Ht 63.5 in | Wt 136.0 lb

## 2016-04-09 DIAGNOSIS — R300 Dysuria: Secondary | ICD-10-CM

## 2016-04-09 DIAGNOSIS — R8290 Unspecified abnormal findings in urine: Secondary | ICD-10-CM | POA: Diagnosis not present

## 2016-04-09 DIAGNOSIS — R350 Frequency of micturition: Secondary | ICD-10-CM

## 2016-04-09 DIAGNOSIS — M545 Low back pain: Secondary | ICD-10-CM

## 2016-04-09 DIAGNOSIS — R319 Hematuria, unspecified: Secondary | ICD-10-CM

## 2016-04-09 LAB — POCT URINALYSIS DIPSTICK
Glucose, UA: NEGATIVE
Ketones, UA: NEGATIVE
NITRITE UA: NEGATIVE
PROTEIN UA: NEGATIVE

## 2016-04-09 MED ORDER — SULFAMETHOXAZOLE-TRIMETHOPRIM 800-160 MG PO TABS: 1.0000 | ORAL_TABLET | Freq: Two times a day (BID) | ORAL | 0 refills | Status: DC

## 2016-04-09 MED FILL — SULFAMETHOXAZOLE/TMP DS TAB: 800-160 | 7 days supply | Qty: 14 | Fill #0

## 2016-04-09 NOTE — Progress Notes (Signed)
Subjective:     Patient ID: [name], female   DOB: [birth year], 61 y.o.   MRN: [id number]  HPI Velna is a 61 year old white female, married in complaining of burning with urination and urinary frequency x 2 days.And urine has odor, and some low back pain.  Review of Systems +burning with urination Urinary frequency x 2 days Urine with odor Low back pain  Reviewed past medical,surgical, social and family history. Reviewed medications and allergies.     Objective:   Physical Exam BP 114/64 (BP Location: Right Arm, Patient Position: Sitting, Cuff Size: Normal)   Pulse 72   Ht 5' 3.5" (1.613 m)   Wt 136 lb (61.7 kg)   BMI 23.71 kg/m Urine +blood and leuks.Skin warm and dry, no CVAT, bladder tender.    Assessment:     1. Dysuria   2. Urinary frequency   3. Abnormal urine odor   4. Hematuria, unspecified type       Plan:     Meds ordered this encounter  Medications  . sulfamethoxazole-trimethoprim (BACTRIM DS,SEPTRA DS) 800-160 MG tablet    Sig: Take 1 tablet by mouth 2 (two) times daily.    Dispense:  14 tablet    Refill:  0    Order Specific Question:   Supervising Provider    Answer:   Elonda Husky, LUTHER H [[id number]]  UA C&S sent Push water Try AZO Follow up prn

## 2016-04-10 LAB — MICROSCOPIC EXAMINATION: CASTS: NONE SEEN /LPF

## 2016-04-10 LAB — URINALYSIS, ROUTINE W REFLEX MICROSCOPIC
Bilirubin, UA: NEGATIVE
Glucose, UA: NEGATIVE
Ketones, UA: NEGATIVE
NITRITE UA: NEGATIVE
PH UA: 8.5 — AB (ref 5.0–7.5)
Protein, UA: NEGATIVE
Specific Gravity, UA: 1.019 (ref 1.005–1.030)
Urobilinogen, Ur: 0.2 mg/dL (ref 0.2–1.0)

## 2016-04-11 LAB — URINE CULTURE

## 2016-04-28 ENCOUNTER — Telehealth: Payer: Self-pay | Admitting: Gastroenterology

## 2016-04-28 NOTE — Telephone Encounter (Signed)
Recall for tcs °

## 2016-04-29 NOTE — Telephone Encounter (Signed)
Letter mailed to pt.  

## 2016-05-08 ENCOUNTER — Encounter: Payer: Self-pay | Admitting: Physician Assistant

## 2016-05-08 ENCOUNTER — Ambulatory Visit (INDEPENDENT_AMBULATORY_CARE_PROVIDER_SITE_OTHER): Payer: 59 | Admitting: Physician Assistant

## 2016-05-08 VITALS — BP 104/70 | HR 79 | Temp 98.1°F | Resp 16 | Wt 134.0 lb

## 2016-05-08 DIAGNOSIS — J040 Acute laryngitis: Secondary | ICD-10-CM

## 2016-05-08 DIAGNOSIS — B9789 Other viral agents as the cause of diseases classified elsewhere: Secondary | ICD-10-CM | POA: Diagnosis not present

## 2016-05-08 DIAGNOSIS — J029 Acute pharyngitis, unspecified: Secondary | ICD-10-CM | POA: Diagnosis not present

## 2016-05-08 LAB — STREP GROUP A AG, W/REFLEX TO CULT: STREGTOCOCCUS GROUP A AG SCREEN: NOT DETECTED

## 2016-05-09 NOTE — Progress Notes (Signed)
    Patient ID: Kylie Duffy MRN: [id number], DOB: July 20, 1955, 61 y.o. Date of Encounter: 05/09/2016, 8:21 AM    Chief Complaint:  Chief Complaint  Patient presents with  . Cough    x2days  . Sore Throat     HPI: 61 y.o. year old female presents female presents with above.   Says that Monday she felt more tired than usual. Tuesday had some sore throat and hoarseness. Wednesday-hoarseness. Wednesday night developed some cough. Has had a little mucus today. Has continued to [facility] front office at a doctors office. Has had no fever.      Home Meds:   Outpatient Medications Prior to Visit  Medication Sig Dispense Refill  . Calcium Carbonate-Vitamin D (CALCIUM 600 + D PO) Take 1 tablet by mouth daily.    . Multiple Vitamin (MULITIVITAMIN WITH MINERALS) TABS Take 1 tablet by mouth daily.    . Red Yeast Rice Extract (RED YEAST RICE PO) Take by mouth as needed.    . SUMAtriptan (IMITREX) 100 MG tablet Take 1 tablet (100 mg total) by mouth every 2 (two) hours as needed for migraine or headache. May repeat in 2 hours if needed. 10 tablet 1  . Vitamin D, Cholecalciferol, 400 units CAPS Take by mouth daily.     Marland Kitchen sulfamethoxazole-trimethoprim (BACTRIM DS,SEPTRA DS) 800-160 MG tablet Take 1 tablet by mouth 2 (two) times daily. 14 tablet 0   No facility-administered medications prior to visit.     Allergies: No Known Allergies    Review of Systems: See HPI for pertinent ROS. All other ROS negative.    Physical Exam: Blood pressure 104/70, pulse 79, temperature 98.1 F (36.7 C), temperature source Oral, resp. rate 16, weight 134 lb (60.8 kg), SpO2 98 %., Body mass index is 23.36 kg/m. General:  WNWD WF. Appears in no acute distress. HEENT: Normocephalic, atraumatic, eyes without discharge, sclera non-icteric, nares are without discharge. Bilateral auditory canals clear, TM's are without perforation, pearly grey and translucent with reflective cone of light bilaterally. Oral cavity moist, posterior  pharynx with minimal erythema, no exudate. Neck: Supple. No thyromegaly. No lymphadenopathy. Lungs: Clear bilaterally to auscultation without wheezes, rales, or rhonchi. Breathing is unlabored. Heart: Regular rhythm. No murmurs, rubs, or gallops. Msk:  Strength and tone normal for age. Extremities/Skin: Warm and dry.  No rashes. Neuro: Alert and oriented X 3. Moves all extremities spontaneously. Gait is normal. CNII-XII grossly in tact. Psych:  Responds to questions appropriately with a normal affect.   Results for orders placed or performed in visit on 05/08/16  STREP GROUP A AG, W/REFLEX TO CULT  Result Value Ref Range   SOURCE THROAT    STREGTOCOCCUS GROUP A AG SCREEN Not Detected      ASSESSMENT AND PLAN:  61 y.o. year old female with with  1. Viral laryngitis  2. Sore throat - STREP GROUP A AG, W/REFLEX TO CULT  At this point, c/w viral illness. Treat with voice rest, symptomatic treatment. F/U if develops fever or if symptoms worsen or do not resolve after 7 - 10 days.  Signed, [address] [place], [place], [facility] 05/09/2016 8:21 AM

## 2016-05-10 LAB — CULTURE, GROUP A STREP

## 2016-05-29 ENCOUNTER — Telehealth: Payer: Self-pay

## 2016-05-29 NOTE — Telephone Encounter (Signed)
PT decided she wants to wait and schedule in July on a Friday. I have her info and will call her when I get the July schedule.  She is not having any problems.

## 2016-06-25 ENCOUNTER — Other Ambulatory Visit: Payer: 59

## 2016-06-25 DIAGNOSIS — R7989 Other specified abnormal findings of blood chemistry: Secondary | ICD-10-CM

## 2016-06-25 DIAGNOSIS — Z0000 Encounter for general adult medical examination without abnormal findings: Secondary | ICD-10-CM | POA: Diagnosis not present

## 2016-06-25 LAB — CBC WITH DIFFERENTIAL/PLATELET
BASOS PCT: 1 %
Basophils Absolute: 61 cells/uL (ref 0–200)
EOS ABS: 122 {cells}/uL (ref 15–500)
Eosinophils Relative: 2 %
HCT: 39.3 % (ref 35.0–45.0)
Hemoglobin: 12.3 g/dL (ref 12.0–15.0)
Lymphocytes Relative: 34 %
Lymphs Abs: 2074 cells/uL (ref 850–3900)
MCH: 28.1 pg (ref 27.0–33.0)
MCHC: 31.3 g/dL — ABNORMAL LOW (ref 32.0–36.0)
MCV: 89.9 fL (ref 80.0–100.0)
MONOS PCT: 7 %
MPV: 10.3 fL (ref 7.5–12.5)
Monocytes Absolute: 427 cells/uL (ref 200–950)
NEUTROS ABS: 3416 {cells}/uL (ref 1500–7800)
Neutrophils Relative %: 56 %
PLATELETS: 427 10*3/uL — AB (ref 140–400)
RBC: 4.37 MIL/uL (ref 3.80–5.10)
RDW: 15 % (ref 11.0–15.0)
WBC: 6.1 10*3/uL (ref 3.8–10.8)

## 2016-06-25 LAB — COMPREHENSIVE METABOLIC PANEL
ALK PHOS: 62 U/L (ref 33–130)
ALT: 10 U/L (ref 6–29)
AST: 14 U/L (ref 10–35)
Albumin: 4 g/dL (ref 3.6–5.1)
BILIRUBIN TOTAL: 0.4 mg/dL (ref 0.2–1.2)
BUN: 15 mg/dL (ref 7–25)
CO2: 25 mmol/L (ref 20–31)
CREATININE: 0.87 mg/dL (ref 0.50–0.99)
Calcium: 9 mg/dL (ref 8.6–10.4)
Chloride: 104 mmol/L (ref 98–110)
Glucose, Bld: 73 mg/dL (ref 70–99)
POTASSIUM: 4.8 mmol/L (ref 3.5–5.3)
Sodium: 141 mmol/L (ref 135–146)
TOTAL PROTEIN: 6.5 g/dL (ref 6.1–8.1)

## 2016-06-25 LAB — LIPID PANEL
CHOL/HDL RATIO: 3.8 ratio (ref <5.0)
CHOLESTEROL: 216 mg/dL — AB (ref <200)
HDL: 57 mg/dL (ref >50)
LDL Cholesterol: 141 mg/dL — ABNORMAL HIGH (ref <100)
Triglycerides: 88 mg/dL (ref <150)
VLDL: 18 mg/dL (ref <30)

## 2016-06-25 LAB — TSH: TSH: 2.53 mIU/L

## 2016-06-26 LAB — VITAMIN D 25 HYDROXY (VIT D DEFICIENCY, FRACTURES): VIT D 25 HYDROXY: 51 ng/mL (ref 30–100)

## 2016-06-30 ENCOUNTER — Ambulatory Visit (INDEPENDENT_AMBULATORY_CARE_PROVIDER_SITE_OTHER): Payer: 59 | Admitting: Family Medicine

## 2016-06-30 ENCOUNTER — Encounter: Payer: Self-pay | Admitting: Family Medicine

## 2016-06-30 VITALS — BP 94/56 | HR 62 | Temp 98.0°F | Resp 12 | Ht 63.5 in | Wt 133.0 lb

## 2016-06-30 DIAGNOSIS — E785 Hyperlipidemia, unspecified: Secondary | ICD-10-CM | POA: Diagnosis not present

## 2016-06-30 DIAGNOSIS — Z0000 Encounter for general adult medical examination without abnormal findings: Secondary | ICD-10-CM

## 2016-06-30 DIAGNOSIS — Z1211 Encounter for screening for malignant neoplasm of colon: Secondary | ICD-10-CM

## 2016-06-30 DIAGNOSIS — M8589 Other specified disorders of bone density and structure, multiple sites: Secondary | ICD-10-CM | POA: Diagnosis not present

## 2016-06-30 DIAGNOSIS — G43109 Migraine with aura, not intractable, without status migrainosus: Secondary | ICD-10-CM

## 2016-06-30 MED ORDER — SUMATRIPTAN SUCCINATE 100 MG PO TABS: 100.0000 mg | ORAL_TABLET | ORAL | 1 refills | Status: DC | PRN

## 2016-06-30 NOTE — Assessment & Plan Note (Signed)
Controlled rare use of imitrex ,refilled today

## 2016-06-30 NOTE — Assessment & Plan Note (Signed)
Continue calcium vitamin D, vitamin d is now normal

## 2016-06-30 NOTE — Progress Notes (Signed)
   Subjective:    Patient ID: Kylie Duffy, female    DOB: July 20, 1955, 61 y.o.   MRN: [id number]  Patient presents for Annual Exam   Pt here for CPE Mammogram UTD Has GYN Fasting labs reviewed, cholesterol has improved  Immunizations UTD Colonoscopy- Due this year Vitamins reviewed- off red yeast rice Migraine- uses imitrex as needed ,rare use Osteopenia- due for bone density, last in 2015  Review Of Systems:  GEN- denies fatigue, fever, weight loss,weakness, recent illness HEENT- denies eye drainage, change in vision, nasal discharge, CVS- denies chest pain, palpitations RESP- denies SOB, cough, wheeze ABD- denies N/V, change in stools, abd pain GU- denies dysuria, hematuria, dribbling, incontinence MSK- denies joint pain, muscle aches, injury Neuro- denies headache, dizziness, syncope, seizure activity       Objective:    BP (!) 94/56   Pulse 62   Temp 98 F (36.7 C) (Oral)   Resp 12   Ht 5' 3.5" (1.613 m)   Wt 133 lb (60.3 kg)   BMI 23.19 kg/m  GEN- NAD, alert and oriented x3 HEENT- PERRL, EOMI, non injected sclera, pink conjunctiva, MMM, oropharynx clear,TM clear bilat no effusion Neck- Supple, no thyromegaly CVS- RRR, no murmur RESP-CTAB ABD-NABS,soft,NT,ND EXT- No edema Pulses- Radial, DP- 2+        Assessment & Plan:      Problem List Items Addressed This Visit    Osteopenia    Continue calcium vitamin D, vitamin d is now normal      Relevant Orders   DG Bone Density   Mild hyperlipidemia    Continues to improve with dietary changes, no new medicaitons      Migraines    Controlled rare use of imitrex ,refilled today        Other Visit Diagnoses    Routine general medical examination at a health care facility    -  Primary   CPE done, immunizations UTD, pt to schedule Bone density and colonoscopy   Colon cancer screening       Relevant Orders   Ambulatory referral to Gastroenterology      Note: This dictation was prepared with  Dragon dictation along with smaller phrase technology. Any transcriptional errors that result from this process are unintentional.

## 2016-06-30 NOTE — Assessment & Plan Note (Signed)
Continues to improve with dietary changes, no new medicaitons

## 2016-06-30 NOTE — Patient Instructions (Signed)
Schedule your bone density  Continue current medications Schedule colonocopy  F/U 1 year or as needed

## 2016-07-01 NOTE — Telephone Encounter (Signed)
PT left Vm asking for appt on 08/01/2016. I returned her call and left VM that I do not have an appt for that day. She had said that was the only time in July. I told her to call back and give me some idea about August and when I get that schedule I can reserve a slot for her.

## 2016-07-01 NOTE — Telephone Encounter (Signed)
PT has not worked out anything for a procedate yet. Said she might have to wait til August, but she will let me know.

## 2016-07-31 ENCOUNTER — Ambulatory Visit (HOSPITAL_COMMUNITY)
Admission: RE | Admit: 2016-07-31 | Discharge: 2016-07-31 | Disposition: A | Discharge status: Home / Self Care | Payer: 59 | Source: Ambulatory Visit | Attending: Family Medicine | Admitting: Family Medicine

## 2016-07-31 DIAGNOSIS — M8580 Other specified disorders of bone density and structure, unspecified site: Secondary | ICD-10-CM | POA: Diagnosis not present

## 2016-07-31 DIAGNOSIS — Z13820 Encounter for screening for osteoporosis: Secondary | ICD-10-CM | POA: Insufficient documentation

## 2016-07-31 DIAGNOSIS — M85852 Other specified disorders of bone density and structure, left thigh: Secondary | ICD-10-CM | POA: Diagnosis not present

## 2016-07-31 DIAGNOSIS — M8589 Other specified disorders of bone density and structure, multiple sites: Secondary | ICD-10-CM

## 2016-08-04 ENCOUNTER — Telehealth: Payer: Self-pay

## 2016-08-06 ENCOUNTER — Other Ambulatory Visit: Payer: Self-pay

## 2016-08-06 DIAGNOSIS — Z1211 Encounter for screening for malignant neoplasm of colon: Secondary | ICD-10-CM

## 2016-08-06 NOTE — Telephone Encounter (Signed)
Gastroenterology Pre-Procedure Review  Request Date: 08/04/2016 Requesting Physician: On Recall  PATIENT REVIEW QUESTIONS: The patient responded to the following health history questions as indicated:    1. Diabetes Melitis: no 2. Joint replacements in the past 12 months: no 3. Major health problems in the past 3 months: no 4. Has an artificial valve or MVP: no 5. Has a defibrillator: no 6. Has been advised in past to take antibiotics in advance of a procedure like teeth cleaning: no 7. Family history of colon cancer: no  8. Alcohol Use: Rarely 9. History of sleep apnea: no  10. History of coronary artery or other vascular stents placed within the last 12 months: no    MEDICATIONS & ALLERGIES:    Patient reports the following regarding taking any blood thinners:   Plavix? no Aspirin? no Coumadin? no Brilinta? no Xarelto? no Eliquis? no Pradaxa? no Savaysa? no Effient? no  Patient confirms/reports the following medications:  Current Outpatient Prescriptions  Medication Sig Dispense Refill  . Calcium Carbonate-Vitamin D (CALCIUM 600 + D PO) Take 1 tablet by mouth daily.    . Multiple Vitamin (MULITIVITAMIN WITH MINERALS) TABS Take 1 tablet by mouth daily.    . SUMAtriptan (IMITREX) 100 MG tablet Take 1 tablet (100 mg total) by mouth every 2 (two) hours as needed for migraine or headache. May repeat in 2 hours if needed. 10 tablet 1  . Vitamin D, Cholecalciferol, 400 units CAPS Take by mouth 2 (two) times daily.     . Red Yeast Rice Extract (RED YEAST RICE PO) Take by mouth as needed.     No current facility-administered medications for this visit.     Patient confirms/reports the following allergies:  No Known Allergies  No orders of the defined types were placed in this encounter.   AUTHORIZATION INFORMATION Primary Insurance:   ID #:  Group #:  Pre-Cert / Auth required: Pre-Cert / Auth #:   Secondary Insurance:   ID #:   Group #:  Pre-Cert / Auth required:   Pre-Cert / Auth #:   SCHEDULE INFORMATION: Procedure has been scheduled as follows:  Date: 08/25/2016             Time:  2:00 PM Location: [facility] Short Stay  This Gastroenterology Pre-Precedure Review Form is being routed to the following provider(s): Barney Drain, MD

## 2016-08-13 NOTE — Telephone Encounter (Signed)
SUPREP SPLIT DOSING- FULL LIQUIDS WITH BREAKFAST. (IF NO INSURANCE COVERAGE, GIVE SAMPLE OF SUPREP OR CLEANPIQ)  Full Liquid Diet A high-calorie, high-protein supplement should be used to meet your nutritional requirements when the full liquid diet is continued for more than 2 or 3 days. If this diet is to be used for an extended period of time (more than 7 days), a multivitamin should be considered.  Breads and Starches  Allowed: None are allowed   Avoid: Any others.    Potatoes/Pasta/Rice  Allowed: ANY ITEM AS A SOUP OR SMALL PLATE OF MASHED POTATOES OR SCRAMBLED EGGS. (DO NOT EAT MORE THAN ONE SERVING ON THE DAY BEFORE COLONOSCOPY).      Vegetables  Allowed: Strained tomato or vegetable juice. Vegetables pureed in soup.   Avoid: Any others.    Fruit  Allowed: Any strained fruit juices and fruit drinks. Include 1 serving of citrus or vitamin C-enriched fruit juice daily.   Avoid: Any others.  Meat and Meat Substitutes  Allowed: Egg  Avoid: Any meat, fish, or fowl. All cheese.  Milk  Allowed: SOY Milk beverages, including milk shakes and instant breakfast mixes. Smooth yogurt.   Avoid: Any others. Avoid dairy products if not tolerated.    Soups and Combination Foods  Allowed: Broth, strained cream soups. Strained, broth-based soups.   Avoid: Any others.    Desserts and Sweets  Allowed: flavored gelatin, tapioca, ice cream, sherbet, smooth pudding, junket, fruit ices, frozen ice pops, pudding pops, frozen fudge pops, chocolate syrup. Sugar, honey, jelly, syrup.   Avoid: Any others.  Fats and Oils  Allowed: Margarine, butter, cream, sour cream, oils.   Avoid: Any others.  Beverages  Allowed: All.   Avoid: None.  Condiments  Allowed: Iodized salt, pepper, spices, flavorings. Cocoa powder.   Avoid: Any others.    SAMPLE MEAL PLAN Breakfast   cup orange juice.   1 OR 2 EGGS  1 cup milk.   1 cup beverage (coffee or tea).   Cream or sugar,  if desired.    Midmorning Snack  2 SCRAMBLED OR HARD BOILED EGG   Lunch  1 cup cream soup.    cup fruit juice.   1 cup milk.    cup custard.   1 cup beverage (coffee or tea).   Cream or sugar, if desired.    Midafternoon Snack  1 cup milk shake.  Dinner  1 cup cream soup.    cup fruit juice.   1 cup MILK    cup pudding.   1 cup beverage (coffee or tea).   Cream or sugar, if desired.  Evening Snack  1 cup supplement.  To increase calories, add sugar, cream, butter, or margarine if possible. Nutritional supplements will also increase the total calories.

## 2016-08-14 MED ORDER — NA SULFATE-K SULFATE-MG SULF 17.5-3.13-1.6 GM/177ML PO SOLN: 1.0000 | ORAL | 0 refills | Status: DC

## 2016-08-14 MED FILL — SUPREP BOWEL PREP KIT: 17.5-3.13-1 | 1 days supply | Qty: 354 | Fill #0

## 2016-08-14 NOTE — Telephone Encounter (Signed)
Rx sent to the pharmacy and instructions mailed to pt.  

## 2016-08-18 NOTE — Telephone Encounter (Signed)
No PA is needed for TCS 

## 2016-08-25 ENCOUNTER — Encounter (HOSPITAL_COMMUNITY)
Admission: RE | Disposition: A | Discharge status: Home / Self Care | Payer: Self-pay | Source: Ambulatory Visit | Attending: Gastroenterology

## 2016-08-25 ENCOUNTER — Encounter (HOSPITAL_COMMUNITY): Payer: Self-pay

## 2016-08-25 ENCOUNTER — Ambulatory Visit (HOSPITAL_COMMUNITY)
Admission: RE | Admit: 2016-08-25 | Discharge: 2016-08-25 | Disposition: A | Discharge status: Home / Self Care | Payer: 59 | Source: Ambulatory Visit | Attending: Gastroenterology | Admitting: Gastroenterology

## 2016-08-25 DIAGNOSIS — Q438 Other specified congenital malformations of intestine: Secondary | ICD-10-CM | POA: Diagnosis not present

## 2016-08-25 DIAGNOSIS — K644 Residual hemorrhoidal skin tags: Secondary | ICD-10-CM | POA: Diagnosis not present

## 2016-08-25 DIAGNOSIS — Z1212 Encounter for screening for malignant neoplasm of rectum: Secondary | ICD-10-CM

## 2016-08-25 DIAGNOSIS — K648 Other hemorrhoids: Secondary | ICD-10-CM | POA: Diagnosis not present

## 2016-08-25 DIAGNOSIS — Z1211 Encounter for screening for malignant neoplasm of colon: Secondary | ICD-10-CM

## 2016-08-25 DIAGNOSIS — Z87440 Personal history of urinary (tract) infections: Secondary | ICD-10-CM | POA: Insufficient documentation

## 2016-08-25 DIAGNOSIS — D123 Benign neoplasm of transverse colon: Secondary | ICD-10-CM | POA: Insufficient documentation

## 2016-08-25 DIAGNOSIS — Z79899 Other long term (current) drug therapy: Secondary | ICD-10-CM | POA: Diagnosis not present

## 2016-08-25 DIAGNOSIS — M8580 Other specified disorders of bone density and structure, unspecified site: Secondary | ICD-10-CM | POA: Insufficient documentation

## 2016-08-25 HISTORY — PX: POLYPECTOMY: SHX5525

## 2016-08-25 HISTORY — PX: COLONOSCOPY: SHX5424

## 2016-08-25 SURGERY — COLONOSCOPY
Anesthesia: Moderate Sedation

## 2016-08-25 MED ORDER — MEPERIDINE HCL 100 MG/ML IJ SOLN
INTRAMUSCULAR | Status: AC
  Filled 2016-08-25: qty 2

## 2016-08-25 MED ORDER — SODIUM CHLORIDE 0.9 % IV SOLN
INTRAVENOUS | Status: DC
Start: 2016-08-25 — End: 2016-08-25
  Administered 2016-08-25: 1000 mL via INTRAVENOUS

## 2016-08-25 MED ORDER — ONDANSETRON HCL 4 MG/2ML IJ SOLN
INTRAMUSCULAR | Status: DC | PRN
  Administered 2016-08-25: 4 mg via INTRAVENOUS

## 2016-08-25 MED ORDER — ONDANSETRON HCL 4 MG/2ML IJ SOLN
INTRAMUSCULAR | Status: AC
  Filled 2016-08-25: qty 2

## 2016-08-25 MED ORDER — MIDAZOLAM HCL 5 MG/5ML IJ SOLN
INTRAMUSCULAR | Status: DC | PRN
  Administered 2016-08-25 (×2): 1 mg via INTRAVENOUS
  Administered 2016-08-25: 2 mg via INTRAVENOUS
  Administered 2016-08-25: 1 mg via INTRAVENOUS

## 2016-08-25 MED ORDER — MEPERIDINE HCL 100 MG/ML IJ SOLN
INTRAMUSCULAR | Status: DC | PRN
  Administered 2016-08-25 (×2): 25 mg via INTRAVENOUS

## 2016-08-25 MED ORDER — STERILE WATER FOR IRRIGATION IR SOLN
Status: DC | PRN
  Administered 2016-08-25: 14:00:00

## 2016-08-25 MED ORDER — MIDAZOLAM HCL 5 MG/5ML IJ SOLN
INTRAMUSCULAR | Status: AC
  Filled 2016-08-25: qty 10

## 2016-08-25 NOTE — H&P (Signed)
Primary Care Physician:  Alycia Rossetti, MD Primary Gastroenterologist:  Dr. Oneida Alar  Pre-Procedure History & Physical: HPI:  Kylie Duffy is a 61 y.o. female here for [place].  Past Medical History:  Diagnosis Date  . Burning with urination 11/01/2014  . Hematuria 07/28/2013  . History of UTI   . Osteopenia   . PONV (postoperative nausea and vomiting)   . Vaginal dryness 12/03/2012    Past Surgical History:  Procedure Laterality Date  . ABDOMINAL HYSTERECTOMY    . DIAGNOSTIC LAPAROSCOPY    . KNEE ARTHROSCOPY WITH MEDIAL MENISECTOMY  01/26/2012   Procedure: KNEE ARTHROSCOPY WITH MEDIAL MENISECTOMY;  Surgeon: Carole Civil, MD;  Location: [facility];  Service: Orthopedics;  Laterality: Left;  . TONSILLECTOMY      Prior to Admission medications   Medication Sig Start Date End Date Taking? Authorizing Provider  Calcium Carbonate-Vitamin D (CALCIUM 600 + D PO) Take 1 tablet by mouth daily.   Yes [provider]  ibuprofen (ADVIL,MOTRIN) 200 MG tablet Take 400 mg by mouth daily as needed for headache or moderate pain.   Yes [provider]  Multiple Vitamin (MULITIVITAMIN WITH MINERALS) TABS Take 1 tablet by mouth daily.   Yes [provider]  Na Sulfate-K Sulfate-Mg Sulf (SUPREP BOWEL PREP KIT) 17.5-3.13-1.6 GM/180ML SOLN Take 1 kit by mouth as directed. 08/14/16  Yes Fe Okubo, Marga Melnick, MD  SUMAtriptan (IMITREX) 100 MG tablet Take 1 tablet (100 mg total) by mouth every 2 (two) hours as needed for migraine or headache. May repeat in 2 hours if needed. 06/30/16  Yes [place], Modena Nunnery, MD  Vitamin D, Cholecalciferol, 400 units CAPS Take 800 Units by mouth daily.    Yes [provider]    Allergies as of 08/06/2016  . (No Known Allergies)    Family History  Problem Relation Age of Onset  . Hypertension Mother   . Hyperlipidemia Mother   . Thyroid disease Mother   . Kidney disease Mother   . Heart disease Father   . Cancer Maternal  Aunt   . Cancer Maternal Uncle   . Cancer Paternal Aunt   . Cancer Paternal Uncle   . Diabetes Maternal Grandmother   . Cancer Paternal Grandmother        pancreatic  . Hyperlipidemia Brother        borderline  . Cancer Paternal Uncle   . Alzheimer's disease Paternal Aunt     Social History   Social History  . Marital status: Married    Spouse name: N/A  . Number of children: N/A  . Years of education: N/A   Occupational History  . Not on file.   Social History Main Topics  . Smoking status: Never Smoker  . Smokeless tobacco: Never Used  . Alcohol use Yes     Comment: occ glass of wine  . Drug use: No  . Sexual activity: Not Currently    Birth control/ protection: Surgical     Comment: hyst   Other Topics Concern  . Not on file   Social History Narrative  . No narrative on file    Review of Systems: See HPI, otherwise negative ROS   Physical Exam: BP 111/70   Pulse 79   Temp 97.9 F (36.6 C) (Oral)   Resp 16   Ht '5\' 4"'  (1.626 m)   Wt 129 lb (58.5 kg)   SpO2 95%   BMI 22.14 kg/m  General:   Alert,  pleasant and cooperative in NAD Head:  Normocephalic and atraumatic. Neck:  Supple; Lungs:  Clear throughout to auscultation.    Heart:  Regular rate and rhythm. Abdomen:  Soft, nontender and nondistended. Normal bowel sounds, without guarding, and without rebound.   Neurologic:  Alert and  oriented x4;  grossly normal neurologically.  Impression/Plan:     SCREENING  Plan:  1. TCS TODAY. DISCUSSED PROCEDURE, BENEFITS, & RISKS: < 1% chance of medication reaction, bleeding, perforation, or rupture of spleen/liver.

## 2016-08-25 NOTE — Discharge Instructions (Signed)
You had 1 small polyp removed. You have  SMALL internal hemorrhoids.   DRINK WATER TO KEEP YOUR URINE LIGHT YELLOW.  FOLLOW A HIGH FIBER DIET. AVOID ITEMS THAT CAUSE BLOATING & GAS. SEE INFO BELOW.  YOUR BIOPSY RESULTS WILL BE AVAILABLE IN MY CHART AFTER AUG 2 AND MY OFFICE WILL CONTACT YOU IN 10-14 DAYS WITH YOUR RESULTS.   Next colonoscopy in 5-10 years.    Colonoscopy Care After Read the instructions outlined below and refer to this sheet in the next week. These discharge instructions provide you with general information on caring for yourself after you leave the hospital. While your treatment has been planned according to the most current medical practices available, unavoidable complications occasionally occur. If you have any problems or questions after discharge, call DR. Annslee Tercero, [phone].  ACTIVITY  You may resume your regular activity, but move at a slower pace for the next 24 hours.   Take frequent rest periods for the next 24 hours.   Walking will help get rid of the air and reduce the bloated feeling in your belly (abdomen).   No driving for 24 hours (because of the medicine (anesthesia) used during the test).   You may shower.   Do not sign any important legal documents or operate any machinery for 24 hours (because of the anesthesia used during the test).    NUTRITION  Drink plenty of fluids.   You may resume your normal diet as instructed by your doctor.   Begin with a light meal and progress to your normal diet. Heavy or fried foods are harder to digest and may make you feel sick to your stomach (nauseated).   Avoid alcoholic beverages for 24 hours or as instructed.    MEDICATIONS  You may resume your normal medications.   WHAT YOU CAN EXPECT TODAY  Some feelings of bloating in the abdomen.   Passage of more gas than usual.   Spotting of blood in your stool or on the toilet paper  .  IF YOU HAD POLYPS REMOVED DURING THE COLONOSCOPY:  Eat a  soft diet IF YOU HAVE NAUSEA, BLOATING, ABDOMINAL PAIN, OR VOMITING.    FINDING OUT THE RESULTS OF YOUR TEST Not all test results are available during your visit. DR. Oneida Alar WILL CALL YOU WITHIN 14 DAYS OF YOUR PROCEDUE WITH YOUR RESULTS. Do not assume everything is normal if you have not heard from DR. Amaree Loisel, CALL HER OFFICE AT [phone].  SEEK IMMEDIATE MEDICAL ATTENTION AND CALL THE OFFICE: [phone] IF:  You have more than a spotting of blood in your stool.   Your belly is swollen (abdominal distention).   You are nauseated or vomiting.   You have a temperature over 101F.   You have abdominal pain or discomfort that is severe or gets worse throughout the day.   High-Fiber Diet A high-fiber diet changes your normal diet to include more whole grains, legumes, fruits, and vegetables. Changes in the diet involve replacing refined carbohydrates with unrefined foods. The calorie level of the diet is essentially unchanged. The Dietary Reference Intake (recommended amount) for adult males is 38 grams per day. For adult females, it is 25 grams per day. Pregnant and lactating women should consume 28 grams of fiber per day.Fiber is the intact part of a plant that is not broken down during digestion. Functional fiber is fiber that has been isolated from the plant to provide a beneficial effect in the body.   PURPOSE  Increase stool bulk.  Ease and regulate bowel movements.   Lower cholesterol.   REDUCE RISK OF COLON CANCER  INDICATIONS THAT YOU NEED MORE FIBER  Constipation and hemorrhoids.   Uncomplicated diverticulosis (intestine condition) and irritable bowel syndrome.   Weight management.   As a protective measure against hardening of the arteries (atherosclerosis), diabetes, and cancer.   GUIDELINES FOR INCREASING FIBER IN THE DIET  Start adding fiber to the diet slowly. A gradual increase of about 5 more grams (2 slices of whole-wheat bread, 2 servings of most fruits  or vegetables, or 1 bowl of high-fiber cereal) per day is best. Too rapid an increase in fiber may result in constipation, flatulence, and bloating.   Drink enough water and fluids to keep your urine clear or pale yellow. Water, juice, or caffeine-free drinks are recommended. Not drinking enough fluid may cause constipation.   Eat a variety of high-fiber foods rather than one type of fiber.   Try to increase your intake of fiber through using high-fiber foods rather than fiber pills or supplements that contain small amounts of fiber.   The goal is to change the types of food eaten. Do not supplement your present diet with high-fiber foods, but replace foods in your present diet.   INCLUDE A VARIETY OF FIBER SOURCES  Replace refined and processed grains with whole grains, canned fruits with fresh fruits, and incorporate other fiber sources. White rice, white breads, and most bakery goods contain little or no fiber.   Brown whole-grain rice, buckwheat oats, and many fruits and vegetables are all good sources of fiber. These include: broccoli, Brussels sprouts, cabbage, cauliflower, beets, sweet potatoes, white potatoes (skin on), carrots, tomatoes, eggplant, squash, berries, fresh fruits, and dried fruits.   Cereals appear to be the richest source of fiber. Cereal fiber is found in whole grains and bran. Bran is the fiber-rich outer coat of cereal grain, which is largely removed in refining. In whole-grain cereals, the bran remains. In breakfast cereals, the largest amount of fiber is found in those with "bran" in their names. The fiber content is sometimes indicated on the label.   You may need to include additional fruits and vegetables each day.   In baking, for 1 cup white flour, you may use the following substitutions:   1 cup whole-wheat flour minus 2 tablespoons.   1/2 cup white flour plus 1/2 cup whole-wheat flour.   Polyps, Colon  A polyp is extra tissue that grows inside your body.  Colon polyps grow in the large intestine. The large intestine, also called the colon, is part of your digestive system. It is a long, hollow tube at the end of your digestive tract where your body makes and stores stool.Most polyps are not dangerous. They are benign. This means they are not cancerous. But over time, some types of polyps can turn into cancer. Polyps that are smaller than a pea are usually not harmful. But larger polyps could someday become or may already be cancerous. To be safe, doctors remove all polyps and test them.   WHO GETS POLYPS? Anyone can get polyps, but certain people are more likely than others. You may have a greater chance of getting polyps if:  You are over 50.   You have had polyps before.   Someone in your family has had polyps.   Someone in your family has had cancer of the large intestine.   Find out if someone in your family has had polyps. You may also be more  likely to get polyps if you:   Eat a lot of fatty foods   Smoke   Drink alcohol   Do not exercise  Eat too much   PREVENTION There is not one sure way to prevent polyps. You might be able to lower your risk of getting them if you:  Eat more fruits and vegetables and less fatty food.   Do not smoke.   Avoid alcohol.   Exercise every day.   Lose weight if you are overweight.   Eating more calcium and folate can also lower your risk of getting polyps. Some foods that are rich in calcium are milk, cheese, and broccoli. Some foods that are rich in folate are chickpeas, kidney beans, and spinach.   Hemorrhoids Hemorrhoids are dilated (enlarged) veins around the rectum. Sometimes clots will form in the veins. This makes them swollen and painful. These are called thrombosed hemorrhoids. Causes of hemorrhoids include:  Constipation.   Straining to have a bowel movement.   HEAVY LIFTING  HOME CARE INSTRUCTIONS  Eat a well balanced diet and drink 6 to 8 glasses of water every day to  avoid constipation. You may also use a bulk laxative.   Avoid straining to have bowel movements.   Keep anal area dry and clean.   Do not use a donut shaped pillow or sit on the toilet for long periods. This increases blood pooling and pain.   Move your bowels when your body has the urge; this will require less straining and will decrease pain and pressure.

## 2016-08-25 NOTE — Op Note (Signed)
[facility] Patient Name: Kylie Duffy Procedure Date: 08/25/2016 1:27 PM MRN: [id number] Date of Birth: 11-01-1955 Attending MD: Barney Drain , MD CSN: [id number] Age: 61 Admit Type: Outpatient Procedure:                Colonoscopy WITH COLD FORCEPS POLYPECTOMY Indications:              Screening for colorectal malignant neoplasm Providers:                Barney Drain, MD, Otis Peak B. Sharon Seller, RN, Randa Spike, Technician Referring MD:             Modena Nunnery. [place] Medicines:                Ondansetron 4 mg IV, Meperidine 50 mg IV, Midazolam                            5 mg IV Complications:            No immediate complications. Estimated Blood Loss:     Estimated blood loss was minimal. Procedure:                Pre-Anesthesia Assessment:                           - Prior to the procedure, a History and Physical                            was performed, and patient medications and                            allergies were reviewed. The patient's tolerance of                            previous anesthesia was also reviewed. The risks                            and benefits of the procedure and the sedation                            options and risks were discussed with the patient.                            All questions were answered, and informed consent                            was obtained. Prior Anticoagulants: The patient has                            taken ibuprofen. ASA Grade Assessment: II - A                            patient with mild systemic disease. After reviewing  the risks and benefits, the patient was deemed in                            satisfactory condition to undergo the procedure.                            After obtaining informed consent, the colonoscope                            was passed under direct vision. Throughout the                            procedure, the patient's blood pressure,  pulse, and                            oxygen saturations were monitored continuously. The                            Colonoscope was introduced through the anus and                            advanced to the the cecum, identified by                            appendiceal orifice and ileocecal valve. The                            colonoscopy was technically difficult and complex                            due to significant looping and the patient's                            agitation. Successful completion of the procedure                            was aided by increasing the dose of sedation                            medication and COLOWRAP. The patient tolerated the                            procedure fairly well. The quality of the bowel                            preparation was excellent. The ileocecal valve,                            appendiceal orifice, and rectum were photographed. Scope In: 1:52:28 PM Scope Out: 2:12:01 PM Scope Withdrawal Time: 0 hours 11 minutes 4 seconds  Total Procedure Duration: 0 hours 19 minutes 33 seconds  Findings:      A 3 mm polyp was found in the hepatic flexure. The polyp was sessile.  The polyp was removed with a cold biopsy forceps. Resection and       retrieval were complete.      The recto-sigmoid colon, sigmoid colon and descending colon were       significantly redundant.      Internal hemorrhoids were found during retroflexion. The hemorrhoids       were small. Impression:               - One 3 mm polyp at the hepatic flexure, removed                            with a cold biopsy forceps. Resected and retrieved.                           - Redundant LEFT colon.                           - External and internal hemorrhoids. Moderate Sedation:      Moderate (conscious) sedation was administered by the endoscopy nurse       and supervised by the endoscopist. The following parameters were       monitored: oxygen saturation, heart  rate, blood pressure, and response       to care. Total physician intraservice time was 30 minutes. Recommendation:           - Repeat colonoscopy in 5-10 years for surveillance                            W/ PEDS COLONOSCOPE/COLOWRAP.                           - High fiber diet.                           - Continue present medications.                           - Await pathology results.                           - Patient has a contact number available for                            emergencies. The signs and symptoms of potential                            delayed complications were discussed with the                            patient. Return to normal activities tomorrow.                            Written discharge instructions were provided to the                            patient. Procedure Code(s):        --- Professional ---  45364, Colonoscopy, flexible; with biopsy, single                            or multiple                           99152, Moderate sedation services provided by the                            same physician or other qualified health care                            professional performing the diagnostic or                            therapeutic service that the sedation supports,                            requiring the presence of an independent trained                            observer to assist in the monitoring of the                            patient's level of consciousness and physiological                            status; initial 15 minutes of intraservice time,                            patient age 41 years or older                           [phone], Moderate sedation services; each additional                            15 minutes intraservice time Diagnosis Code(s):        --- Professional ---                           Z12.11, Encounter for screening for malignant                            neoplasm of colon                            D12.3, Benign neoplasm of transverse colon (hepatic                            flexure or splenic flexure)                           K64.8, Other hemorrhoids                           Q43.8, Other specified congenital malformations of  intestine CPT copyright 2016 [employer]. All rights reserved. The codes documented in this report are preliminary and upon coder review may  be revised to meet current compliance requirements. Barney Drain, MD Barney Drain, MD 08/25/2016 2:21:25 PM This report has been signed electronically. Number of Addenda: 0

## 2016-08-27 ENCOUNTER — Encounter (HOSPITAL_COMMUNITY): Payer: Self-pay | Admitting: Gastroenterology

## 2016-08-27 NOTE — Progress Notes (Signed)
LMOM to call if she does not see result in MY CHART.

## 2016-08-30 ENCOUNTER — Ambulatory Visit (HOSPITAL_COMMUNITY)
Admission: EM | Admit: 2016-08-30 | Discharge: 2016-08-30 | Disposition: A | Discharge status: Home / Self Care | Payer: 59 | Attending: Internal Medicine | Admitting: Internal Medicine

## 2016-08-30 ENCOUNTER — Encounter (HOSPITAL_COMMUNITY): Payer: Self-pay | Admitting: Family Medicine

## 2016-08-30 DIAGNOSIS — T148XXA Other injury of unspecified body region, initial encounter: Secondary | ICD-10-CM

## 2016-08-30 DIAGNOSIS — R0789 Other chest pain: Secondary | ICD-10-CM | POA: Diagnosis not present

## 2016-08-30 DIAGNOSIS — S5010XA Contusion of unspecified forearm, initial encounter: Secondary | ICD-10-CM | POA: Diagnosis not present

## 2016-08-30 DIAGNOSIS — S80812A Abrasion, left lower leg, initial encounter: Secondary | ICD-10-CM

## 2016-08-30 NOTE — Discharge Instructions (Signed)
Take ibuprofen 600-800 mg 3 times a day or naproxen 440 mg twice a day for the next 10 days. Your muscle soreness might increase in the next 24-48 hours, and this is normal. Hot bath soaks for muscle aches, ice compression to reduce swelling. This can take up to 2-3 weeks to resolve, but should be getting better each week. Monitor for worsening of symptoms, shortness of breath, trouble breathing, chest pain, blood in stools, blood in urine, follow up at the emergency department for further evaluation.
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
## 2016-09-10 DIAGNOSIS — H5213 Myopia, bilateral: Secondary | ICD-10-CM | POA: Diagnosis not present

## 2016-09-10 DIAGNOSIS — H52203 Unspecified astigmatism, bilateral: Secondary | ICD-10-CM | POA: Diagnosis not present

## 2016-09-10 DIAGNOSIS — H524 Presbyopia: Secondary | ICD-10-CM | POA: Diagnosis not present

## 2016-10-13 DIAGNOSIS — Z1283 Encounter for screening for malignant neoplasm of skin: Secondary | ICD-10-CM | POA: Diagnosis not present

## 2016-10-13 DIAGNOSIS — D225 Melanocytic nevi of trunk: Secondary | ICD-10-CM | POA: Diagnosis not present

## 2016-10-13 DIAGNOSIS — L570 Actinic keratosis: Secondary | ICD-10-CM | POA: Diagnosis not present

## 2016-10-13 DIAGNOSIS — X32XXXD Exposure to sunlight, subsequent encounter: Secondary | ICD-10-CM | POA: Diagnosis not present

## 2016-10-13 DIAGNOSIS — B070 Plantar wart: Secondary | ICD-10-CM | POA: Diagnosis not present

## 2016-10-24 ENCOUNTER — Ambulatory Visit (INDEPENDENT_AMBULATORY_CARE_PROVIDER_SITE_OTHER): Payer: 59 | Admitting: Family Medicine

## 2016-10-24 ENCOUNTER — Encounter: Payer: Self-pay | Admitting: Family Medicine

## 2016-10-24 VITALS — BP 118/82 | HR 92 | Temp 98.1°F | Resp 14 | Ht 63.5 in | Wt 130.4 lb

## 2016-10-24 DIAGNOSIS — R2689 Other abnormalities of gait and mobility: Secondary | ICD-10-CM | POA: Diagnosis not present

## 2016-10-24 DIAGNOSIS — G43109 Migraine with aura, not intractable, without status migrainosus: Secondary | ICD-10-CM

## 2016-10-24 DIAGNOSIS — S63502A Unspecified sprain of left wrist, initial encounter: Secondary | ICD-10-CM | POA: Diagnosis not present

## 2016-10-24 DIAGNOSIS — R29818 Other symptoms and signs involving the nervous system: Secondary | ICD-10-CM | POA: Diagnosis not present

## 2016-10-24 DIAGNOSIS — M7122 Synovial cyst of popliteal space [Baker], left knee: Secondary | ICD-10-CM

## 2016-10-24 MED ORDER — SUMATRIPTAN SUCCINATE 100 MG PO TABS: 100.0000 mg | ORAL_TABLET | ORAL | 1 refills | Status: DC | PRN

## 2016-10-24 MED FILL — SUMATRIPTAN SUCC 100 MG TAB: 100 | 30 days supply | Qty: 10 | Fill #0
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
## 2016-10-24 NOTE — Patient Instructions (Addendum)
Continue Ice, ibuprofen, use of ace wrap  Ultrasound of knee to be done MRI of brain  F/U pending results

## 2016-10-26 ENCOUNTER — Encounter: Payer: Self-pay | Admitting: Family Medicine
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
## 2016-10-31 ENCOUNTER — Ambulatory Visit (HOSPITAL_COMMUNITY)
Admission: RE | Admit: 2016-10-31 | Discharge: 2016-10-31 | Disposition: A | Discharge status: Home / Self Care | Payer: 59 | Source: Ambulatory Visit | Attending: Family Medicine | Admitting: Family Medicine

## 2016-10-31 DIAGNOSIS — R930 Abnormal findings on diagnostic imaging of skull and head, not elsewhere classified: Secondary | ICD-10-CM | POA: Insufficient documentation

## 2016-10-31 DIAGNOSIS — R29818 Other symptoms and signs involving the nervous system: Secondary | ICD-10-CM | POA: Diagnosis not present

## 2016-10-31 DIAGNOSIS — R2689 Other abnormalities of gait and mobility: Secondary | ICD-10-CM | POA: Diagnosis not present

## 2016-10-31 DIAGNOSIS — G43109 Migraine with aura, not intractable, without status migrainosus: Secondary | ICD-10-CM | POA: Diagnosis not present

## 2016-10-31 DIAGNOSIS — S0990XA Unspecified injury of head, initial encounter: Secondary | ICD-10-CM | POA: Diagnosis not present

## 2016-10-31 DIAGNOSIS — R51 Headache: Secondary | ICD-10-CM | POA: Diagnosis not present

## 2016-11-03 ENCOUNTER — Encounter: Payer: Self-pay | Admitting: Orthopedic Surgery

## 2016-11-03 ENCOUNTER — Ambulatory Visit (INDEPENDENT_AMBULATORY_CARE_PROVIDER_SITE_OTHER): Payer: 59

## 2016-11-03 ENCOUNTER — Ambulatory Visit (INDEPENDENT_AMBULATORY_CARE_PROVIDER_SITE_OTHER): Payer: 59 | Admitting: Orthopedic Surgery

## 2016-11-03 ENCOUNTER — Encounter: Payer: Self-pay | Admitting: Family Medicine

## 2016-11-03 VITALS — BP 139/85 | HR 88 | Ht 64.0 in | Wt 127.0 lb

## 2016-11-03 DIAGNOSIS — M25532 Pain in left wrist: Secondary | ICD-10-CM | POA: Diagnosis not present

## 2016-11-03 DIAGNOSIS — S62102A Fracture of unspecified carpal bone, left wrist, initial encounter for closed fracture: Secondary | ICD-10-CM

## 2016-11-03 NOTE — Patient Instructions (Signed)
Wear wrist brace for 6 weeks  Do not lift anything over 5 pounds  Remove for bathing  Wear for sleeping for the first 3 weeks
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
## 2016-11-04 ENCOUNTER — Other Ambulatory Visit: Payer: Self-pay | Admitting: *Deleted

## 2016-11-04 DIAGNOSIS — E236 Other disorders of pituitary gland: Secondary | ICD-10-CM

## 2016-11-05 ENCOUNTER — Other Ambulatory Visit: Payer: Self-pay | Admitting: Family Medicine

## 2016-11-05 DIAGNOSIS — Z139 Encounter for screening, unspecified: Secondary | ICD-10-CM

## 2016-11-05 DIAGNOSIS — E236 Other disorders of pituitary gland: Secondary | ICD-10-CM

## 2016-11-06 DIAGNOSIS — X32XXXD Exposure to sunlight, subsequent encounter: Secondary | ICD-10-CM | POA: Diagnosis not present

## 2016-11-06 DIAGNOSIS — L570 Actinic keratosis: Secondary | ICD-10-CM | POA: Diagnosis not present

## 2016-11-07 ENCOUNTER — Ambulatory Visit (HOSPITAL_COMMUNITY)
Admission: RE | Admit: 2016-11-07 | Discharge: 2016-11-07 | Disposition: A | Discharge status: Home / Self Care | Payer: 59 | Source: Ambulatory Visit | Attending: Family Medicine | Admitting: Family Medicine

## 2016-11-07 DIAGNOSIS — M7122 Synovial cyst of popliteal space [Baker], left knee: Secondary | ICD-10-CM | POA: Insufficient documentation

## 2016-11-11 ENCOUNTER — Ambulatory Visit (HOSPITAL_COMMUNITY)
Admission: RE | Admit: 2016-11-11 | Discharge: 2016-11-11 | Disposition: A | Discharge status: Home / Self Care | Payer: 59 | Source: Ambulatory Visit | Attending: Family Medicine | Admitting: Family Medicine

## 2016-11-11 DIAGNOSIS — E236 Other disorders of pituitary gland: Secondary | ICD-10-CM | POA: Diagnosis not present

## 2016-11-11 DIAGNOSIS — R51 Headache: Secondary | ICD-10-CM | POA: Diagnosis not present

## 2016-11-11 MED ORDER — GADOBENATE DIMEGLUMINE 529 MG/ML IV SOLN
6.0000 mL | Freq: Once | INTRAVENOUS | Status: AC | PRN
  Administered 2016-11-11: 6 mL via INTRAVENOUS

## 2016-11-13 ENCOUNTER — Encounter: Payer: Self-pay | Admitting: Family Medicine

## 2016-11-13 DIAGNOSIS — E236 Other disorders of pituitary gland: Secondary | ICD-10-CM | POA: Diagnosis not present

## 2016-11-13 DIAGNOSIS — Z139 Encounter for screening, unspecified: Secondary | ICD-10-CM | POA: Diagnosis not present

## 2016-11-14 ENCOUNTER — Encounter: Payer: Self-pay | Admitting: Family Medicine

## 2016-11-14 DIAGNOSIS — E237 Disorder of pituitary gland, unspecified: Secondary | ICD-10-CM

## 2016-11-17 ENCOUNTER — Encounter: Payer: Self-pay | Admitting: Family Medicine

## 2016-11-23 ENCOUNTER — Encounter: Payer: Self-pay | Admitting: Family Medicine

## 2016-11-28 ENCOUNTER — Encounter: Payer: Self-pay | Admitting: Family Medicine

## 2016-12-15 ENCOUNTER — Ambulatory Visit (INDEPENDENT_AMBULATORY_CARE_PROVIDER_SITE_OTHER): Payer: 59

## 2016-12-15 ENCOUNTER — Ambulatory Visit: Payer: 59 | Admitting: Orthopedic Surgery

## 2016-12-15 ENCOUNTER — Encounter: Payer: Self-pay | Admitting: Orthopedic Surgery

## 2016-12-15 VITALS — BP 124/79 | HR 71 | Resp 16 | Ht 64.0 in

## 2016-12-15 DIAGNOSIS — S62102D Fracture of unspecified carpal bone, left wrist, subsequent encounter for fracture with routine healing: Secondary | ICD-10-CM

## 2016-12-15 NOTE — Progress Notes (Signed)
wri

## 2016-12-15 NOTE — Patient Instructions (Signed)
You can remove the brace but use it as needed  Use Tylenol or Advil as needed for pain as well as capsaicin or Aspercreme for discomfort

## 2016-12-15 NOTE — Progress Notes (Signed)
Chief Complaint  Patient presents with  . Wrist Pain    left / still has some pain at times, improving     Follow-up visit.  Status post nondisplaced distal radius fracture at the radial ulnar joint  Patient complains of discomfort but feels that it is improving.  She is want a splint for 6 weeks  Review of systems no numbness or tingling has been noted  Exam shows mild tenderness actually at the dorsal aspect of the distal TFCC area.  Painful pronation supination is noted.  Normal range of motion is otherwise noted at the wrist.  No stability issues are noted.  Grip strength appears to be returning to normal skin is intact pulses are good sensation is normal  X-ray was ordered and interpreted  X-ray I read as a healed distal radius fracture at the distal metaphyseal region in the radial ulnar joint.  Recommend Advil or Tylenol for pain as well as topical Aspercreme or capsaicin for discomfort  Patient will do this for 4 weeks and as long as she has improved no further workup is needed.

## 2016-12-26 ENCOUNTER — Ambulatory Visit: Payer: 59 | Admitting: Endocrinology

## 2016-12-26 ENCOUNTER — Encounter: Payer: Self-pay | Admitting: Endocrinology

## 2016-12-26 DIAGNOSIS — E237 Disorder of pituitary gland, unspecified: Secondary | ICD-10-CM

## 2016-12-26 LAB — URINALYSIS, ROUTINE W REFLEX MICROSCOPIC
BILIRUBIN URINE: NEGATIVE
HGB URINE DIPSTICK: NEGATIVE
Ketones, ur: NEGATIVE
LEUKOCYTES UA: NEGATIVE
NITRITE: NEGATIVE
RBC / HPF: NONE SEEN (ref 0–?)
Specific Gravity, Urine: 1.015 (ref 1.000–1.030)
Total Protein, Urine: NEGATIVE
Urine Glucose: NEGATIVE
Urobilinogen, UA: 0.2 (ref 0.0–1.0)
pH: 7 (ref 5.0–8.0)

## 2016-12-26 LAB — BASIC METABOLIC PANEL
BUN: 16 mg/dL (ref 6–23)
CHLORIDE: 102 meq/L (ref 96–112)
CO2: 29 mEq/L (ref 19–32)
Calcium: 9.6 mg/dL (ref 8.4–10.5)
Creatinine, Ser: 0.77 mg/dL (ref 0.40–1.20)
GFR: 81.11 mL/min (ref >60.00)
Glucose, Bld: 95 mg/dL (ref 70–99)
POTASSIUM: 3.9 meq/L (ref 3.5–5.1)
SODIUM: 138 meq/L (ref 135–145)

## 2016-12-26 LAB — T4, FREE: FREE T4: 1.01 ng/dL (ref 0.60–1.60)

## 2016-12-26 LAB — LUTEINIZING HORMONE: LH: 56.74 m[IU]/mL

## 2016-12-26 LAB — CORTISOL: Cortisol, Plasma: 3.9 ug/dL

## 2016-12-26 LAB — FOLLICLE STIMULATING HORMONE: FSH: 161.5 m[IU]/mL
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
## 2016-12-26 NOTE — Patient Instructions (Addendum)
blood tests are requested for you today.  We'll let you know about the results.   Please come back for a follow-up appointment in 6 months.  

## 2016-12-27 LAB — PROLACTIN: Prolactin: 8.1 ng/mL

## 2017-01-03 LAB — INSULIN-LIKE GROWTH FACTOR
IGF-I, LC/MS: 83 ng/mL (ref 41–279)
Z-SCORE (FEMALE): -0.9 {STDV} (ref ?–2.0)

## 2017-01-03 LAB — ARGININE VASOPRESSIN HORMONE: Arginine Vasopressin: 1.2 pg/mL

## 2017-01-03 LAB — ACTH: C206 ACTH: 16 pg/mL (ref 6–50)

## 2017-01-09 DIAGNOSIS — S62102D Fracture of unspecified carpal bone, left wrist, subsequent encounter for fracture with routine healing: Secondary | ICD-10-CM | POA: Diagnosis not present

## 2017-01-12 ENCOUNTER — Encounter: Payer: Self-pay | Admitting: Orthopedic Surgery
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
## 2017-01-22 DIAGNOSIS — L258 Unspecified contact dermatitis due to other agents: Secondary | ICD-10-CM | POA: Diagnosis not present

## 2017-02-10 ENCOUNTER — Ambulatory Visit (INDEPENDENT_AMBULATORY_CARE_PROVIDER_SITE_OTHER): Payer: 59 | Admitting: Adult Health

## 2017-02-10 ENCOUNTER — Encounter: Payer: Self-pay | Admitting: Adult Health

## 2017-02-10 VITALS — BP 102/60 | HR 82 | Resp 16 | Ht 63.5 in | Wt 129.0 lb

## 2017-02-10 DIAGNOSIS — N898 Other specified noninflammatory disorders of vagina: Secondary | ICD-10-CM

## 2017-02-10 DIAGNOSIS — Z1211 Encounter for screening for malignant neoplasm of colon: Secondary | ICD-10-CM | POA: Insufficient documentation

## 2017-02-10 DIAGNOSIS — Z1212 Encounter for screening for malignant neoplasm of rectum: Secondary | ICD-10-CM

## 2017-02-10 DIAGNOSIS — Z01411 Encounter for gynecological examination (general) (routine) with abnormal findings: Secondary | ICD-10-CM

## 2017-02-10 DIAGNOSIS — Z01419 Encounter for gynecological examination (general) (routine) without abnormal findings: Secondary | ICD-10-CM

## 2017-02-10 DIAGNOSIS — R350 Frequency of micturition: Secondary | ICD-10-CM

## 2017-02-10 LAB — POCT URINALYSIS DIPSTICK
Blood, UA: NEGATIVE
Glucose, UA: NEGATIVE
Nitrite, UA: NEGATIVE
Protein, UA: NEGATIVE

## 2017-02-10 LAB — HEMOCCULT GUIAC POC 1CARD (OFFICE): Fecal Occult Blood, POC: NEGATIVE

## 2017-02-10 NOTE — Progress Notes (Signed)
Patient ID: Kylie Duffy, female   DOB: September 22, 1955, 62 y.o.   MRN: [id number] History of Present Illness: Kylie Duffy is a 62 year old white female, married in for well woman gyn exam, she is sp hysterectomy.Complains of urinary frequency.Has had several falls and Fractured left wrist, and has had headache and MRI showed prominent pituitary gland, with negative work up and saw Dr Loanne Drilling.  PCP is Dr Buelah Manis.   Current Medications, Allergies, Past Medical History, Past Surgical History, Family History and Social History were reviewed in [facility] record.     Review of Systems: Patient denies any  hearing loss, fatigue, blurred vision, shortness of breath, chest pain, abdominal pain, problems with bowel movements, or intercourse. No joint pain or mood swings.See HPI for positives.     Physical Exam:BP 102/60 (BP Location: Right Arm, Patient Position: Sitting, Cuff Size: Normal)   Pulse 82   Resp 16   Ht 5' 3.5" (1.613 m)   Wt 129 lb (58.5 kg)   BMI 22.49 kg/m Urine large lueks and trace ketones. General:  Well developed, well nourished, no acute distress Skin:  Warm and dry Neck:  Midline trachea, normal thyroid, good ROM, no lymphadenopathy Lungs; Clear to auscultation bilaterally Breast:  No dominant palpable mass, retraction, or nipple discharge Cardiovascular: Regular rate and rhythm Abdomen:  Soft, non tender, no hepatosplenomegaly Pelvic:  External genitalia is normal in appearance, no lesions.  The vagina is pale and dry.Marland Kitchen Urethra has no lesions or masses. The cervix and uterus are absent.   No adnexal masses or tenderness noted.Bladder is non tender, no masses felt. Rectal: Good sphincter tone, no polyps, or hemorrhoids felt.  Hemoccult negative. Extremities/musculoskeletal:  No swelling or varicosities noted, no clubbing or cyanosis Psych:  No mood changes, alert and cooperative,seems happy PHQ 2 score 0.  Impression: 1. Well woman exam with routine  gynecological exam   2. Frequency of urination   3. Screening for colorectal cancer   4. Vaginal dryness       Plan: UA C&S sent Physical in 1 year Mammogram yearly Labs with PCP

## 2017-02-12 LAB — URINALYSIS, ROUTINE W REFLEX MICROSCOPIC
Bilirubin, UA: NEGATIVE
Glucose, UA: NEGATIVE
Ketones, UA: NEGATIVE
Nitrite, UA: NEGATIVE
PROTEIN UA: NEGATIVE
RBC, UA: NEGATIVE
Specific Gravity, UA: 1.021 (ref 1.005–1.030)
Urobilinogen, Ur: 0.2 mg/dL (ref 0.2–1.0)
pH, UA: 7.5 (ref 5.0–7.5)

## 2017-02-12 LAB — MICROSCOPIC EXAMINATION
BACTERIA UA: NONE SEEN
Casts: NONE SEEN /lpf
RBC, UA: NONE SEEN /hpf (ref 0–?)
WBC, UA: >30 /hpf — AB (ref 0–?)

## 2017-02-13 ENCOUNTER — Other Ambulatory Visit: Payer: Self-pay | Admitting: Adult Health

## 2017-02-13 DIAGNOSIS — Z1231 Encounter for screening mammogram for malignant neoplasm of breast: Secondary | ICD-10-CM

## 2017-02-13 LAB — URINE CULTURE: Organism ID, Bacteria: NO GROWTH

## 2017-03-09 ENCOUNTER — Ambulatory Visit
Admission: RE | Admit: 2017-03-09 | Discharge: 2017-03-09 | Disposition: A | Discharge status: Home / Self Care | Payer: 59 | Source: Ambulatory Visit | Attending: Adult Health | Admitting: Adult Health

## 2017-03-09 DIAGNOSIS — Z1231 Encounter for screening mammogram for malignant neoplasm of breast: Secondary | ICD-10-CM

## 2017-03-11 ENCOUNTER — Other Ambulatory Visit: Payer: Self-pay | Admitting: Adult Health

## 2017-03-11 DIAGNOSIS — R928 Other abnormal and inconclusive findings on diagnostic imaging of breast: Secondary | ICD-10-CM

## 2017-03-20 ENCOUNTER — Ambulatory Visit
Admission: RE | Admit: 2017-03-20 | Discharge: 2017-03-20 | Disposition: A | Discharge status: Home / Self Care | Payer: 59 | Source: Ambulatory Visit | Attending: Adult Health | Admitting: Adult Health

## 2017-03-20 DIAGNOSIS — R921 Mammographic calcification found on diagnostic imaging of breast: Secondary | ICD-10-CM | POA: Diagnosis not present

## 2017-03-20 DIAGNOSIS — R928 Other abnormal and inconclusive findings on diagnostic imaging of breast: Secondary | ICD-10-CM

## 2017-06-04 ENCOUNTER — Other Ambulatory Visit: Payer: Self-pay | Admitting: Orthopedic Surgery

## 2017-06-04 DIAGNOSIS — S62102D Fracture of unspecified carpal bone, left wrist, subsequent encounter for fracture with routine healing: Secondary | ICD-10-CM

## 2017-06-10 ENCOUNTER — Ambulatory Visit (HOSPITAL_COMMUNITY)
Admission: RE | Admit: 2017-06-10 | Discharge: 2017-06-10 | Disposition: A | Discharge status: Home / Self Care | Payer: 59 | Source: Ambulatory Visit | Attending: Orthopedic Surgery | Admitting: Orthopedic Surgery

## 2017-06-10 DIAGNOSIS — M19032 Primary osteoarthritis, left wrist: Secondary | ICD-10-CM | POA: Diagnosis not present

## 2017-06-10 DIAGNOSIS — M1812 Unilateral primary osteoarthritis of first carpometacarpal joint, left hand: Secondary | ICD-10-CM | POA: Diagnosis not present

## 2017-06-10 DIAGNOSIS — X58XXXD Exposure to other specified factors, subsequent encounter: Secondary | ICD-10-CM | POA: Diagnosis not present

## 2017-06-10 DIAGNOSIS — S62102D Fracture of unspecified carpal bone, left wrist, subsequent encounter for fracture with routine healing: Secondary | ICD-10-CM | POA: Insufficient documentation

## 2017-06-17 ENCOUNTER — Telehealth: Payer: Self-pay | Admitting: Radiology

## 2017-06-17 NOTE — Telephone Encounter (Signed)
Patient states you indicated she would need a steroid taper but not sent in.

## 2017-06-18 ENCOUNTER — Other Ambulatory Visit: Payer: Self-pay | Admitting: Orthopedic Surgery

## 2017-06-18 DIAGNOSIS — M25532 Pain in left wrist: Secondary | ICD-10-CM

## 2017-06-18 MED ORDER — PREDNISOLONE 5 MG (48) PO TBPK: 5.0000 mg | ORAL_TABLET | ORAL | 1 refills | Status: DC

## 2017-06-18 NOTE — Progress Notes (Signed)
62 year old female injured her wrist treated with immobilization persistent wrist pain many months after the fracture.  Repeat examination did not reveal any abnormalities other than pain at the radial ulnar articulation.  CT scan was negative.  Patient will be placed on Dosepak.

## 2017-06-23 ENCOUNTER — Other Ambulatory Visit: Payer: Self-pay | Admitting: Orthopedic Surgery

## 2017-06-23 DIAGNOSIS — M659 Synovitis and tenosynovitis, unspecified: Secondary | ICD-10-CM

## 2017-06-23 MED ORDER — PREDNISONE 5 MG PO TABS: 5.0000 mg | ORAL_TABLET | Freq: Two times a day (BID) | ORAL | 0 refills | Status: DC

## 2017-06-24 MED FILL — predniSONE 5 MG TABS: 5 | 7 days supply | Qty: 14 | Fill #0

## 2017-06-25 ENCOUNTER — Ambulatory Visit: Payer: 59 | Admitting: Endocrinology

## 2017-06-25 ENCOUNTER — Encounter: Payer: Self-pay | Admitting: Endocrinology

## 2017-06-25 VITALS — BP 102/72 | HR 79 | Ht 63.5 in | Wt 133.0 lb

## 2017-06-25 DIAGNOSIS — E237 Disorder of pituitary gland, unspecified: Secondary | ICD-10-CM | POA: Diagnosis not present
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
## 2017-06-25 NOTE — Patient Instructions (Addendum)
blood tests are requested for you today.  We'll let you know about the results.   If you want to have done next week at [facility], they would need to agree to re-input the requests. We can hold off on repeating the MRI for now.   Please come back for a follow-up appointment in 6 months.

## 2017-06-26 ENCOUNTER — Encounter: Payer: Self-pay | Admitting: Family Medicine

## 2017-06-29 ENCOUNTER — Other Ambulatory Visit: Payer: Self-pay | Admitting: *Deleted

## 2017-06-29 DIAGNOSIS — E785 Hyperlipidemia, unspecified: Secondary | ICD-10-CM

## 2017-06-29 DIAGNOSIS — Z0000 Encounter for general adult medical examination without abnormal findings: Secondary | ICD-10-CM

## 2017-07-03 ENCOUNTER — Other Ambulatory Visit (INDEPENDENT_AMBULATORY_CARE_PROVIDER_SITE_OTHER): Payer: 59

## 2017-07-03 ENCOUNTER — Other Ambulatory Visit: Payer: 59

## 2017-07-03 ENCOUNTER — Encounter: Payer: Self-pay | Admitting: Endocrinology

## 2017-07-03 DIAGNOSIS — E785 Hyperlipidemia, unspecified: Secondary | ICD-10-CM

## 2017-07-03 DIAGNOSIS — E237 Disorder of pituitary gland, unspecified: Secondary | ICD-10-CM

## 2017-07-03 DIAGNOSIS — Z0000 Encounter for general adult medical examination without abnormal findings: Secondary | ICD-10-CM | POA: Diagnosis not present

## 2017-07-03 LAB — LUTEINIZING HORMONE: LH: 64.25 m[IU]/mL

## 2017-07-03 LAB — T4, FREE: FREE T4: 1.09 ng/dL (ref 0.60–1.60)

## 2017-07-03 LAB — TSH: TSH: 2.7 u[IU]/mL (ref 0.35–4.50)

## 2017-07-04 LAB — CBC WITH DIFFERENTIAL/PLATELET
Basophils Absolute: 73 cells/uL (ref 0–200)
Basophils Relative: 0.9 %
Eosinophils Absolute: 154 cells/uL (ref 15–500)
Eosinophils Relative: 1.9 %
HCT: 41 % (ref 35.0–45.0)
Hemoglobin: 13.9 g/dL (ref 11.7–15.5)
Lymphs Abs: 2916 cells/uL (ref 850–3900)
MCH: 29.3 pg (ref 27.0–33.0)
MCHC: 33.9 g/dL (ref 32.0–36.0)
MCV: 86.3 fL (ref 80.0–100.0)
MPV: 10.2 fL (ref 7.5–12.5)
Monocytes Relative: 5.6 %
NEUTROS PCT: 55.6 %
Neutro Abs: 4504 cells/uL (ref 1500–7800)
PLATELETS: 540 10*3/uL — AB (ref 140–400)
RBC: 4.75 10*6/uL (ref 3.80–5.10)
RDW: 13.2 % (ref 11.0–15.0)
TOTAL LYMPHOCYTE: 36 %
WBC: 8.1 10*3/uL (ref 3.8–10.8)
WBCMIX: 454 {cells}/uL (ref 200–950)

## 2017-07-04 LAB — COMPLETE METABOLIC PANEL WITH GFR
AG RATIO: 1.7 (calc) (ref 1.0–2.5)
ALBUMIN MSPROF: 4.4 g/dL (ref 3.6–5.1)
ALT: 10 U/L (ref 6–29)
AST: 14 U/L (ref 10–35)
Alkaline phosphatase (APISO): 66 U/L (ref 33–130)
BUN: 22 mg/dL (ref 7–25)
CALCIUM: 9.3 mg/dL (ref 8.6–10.4)
CO2: 29 mmol/L (ref 20–32)
Chloride: 102 mmol/L (ref 98–110)
Creat: 0.89 mg/dL (ref 0.50–0.99)
GFR, EST AFRICAN AMERICAN: 81 mL/min/{1.73_m2} (ref >=60)
GFR, Est Non African American: 70 mL/min/{1.73_m2} (ref >=60)
GLOBULIN: 2.6 g/dL (ref 1.9–3.7)
Glucose, Bld: 81 mg/dL (ref 65–99)
POTASSIUM: 4.8 mmol/L (ref 3.5–5.3)
SODIUM: 140 mmol/L (ref 135–146)
Total Bilirubin: 0.4 mg/dL (ref 0.2–1.2)
Total Protein: 7 g/dL (ref 6.1–8.1)

## 2017-07-04 LAB — LIPID PANEL
CHOL/HDL RATIO: 3.7 (calc) (ref <5.0)
Cholesterol: 240 mg/dL — ABNORMAL HIGH (ref <200)
HDL: 65 mg/dL (ref >50)
LDL Cholesterol (Calc): 156 mg/dL (calc) — ABNORMAL HIGH
NON-HDL CHOLESTEROL (CALC): 175 mg/dL — AB (ref <130)
Triglycerides: 84 mg/dL (ref <150)

## 2017-07-07 ENCOUNTER — Encounter: Payer: Self-pay | Admitting: *Deleted

## 2017-07-07 ENCOUNTER — Encounter: Payer: Self-pay | Admitting: Family Medicine

## 2017-07-07 ENCOUNTER — Ambulatory Visit (INDEPENDENT_AMBULATORY_CARE_PROVIDER_SITE_OTHER): Payer: 59 | Admitting: Family Medicine

## 2017-07-07 ENCOUNTER — Other Ambulatory Visit: Payer: Self-pay

## 2017-07-07 VITALS — BP 110/60 | HR 72 | Temp 98.3°F | Resp 14 | Ht 63.5 in | Wt 132.0 lb

## 2017-07-07 DIAGNOSIS — M8589 Other specified disorders of bone density and structure, multiple sites: Secondary | ICD-10-CM | POA: Diagnosis not present

## 2017-07-07 DIAGNOSIS — Z0000 Encounter for general adult medical examination without abnormal findings: Secondary | ICD-10-CM | POA: Diagnosis not present

## 2017-07-07 DIAGNOSIS — E785 Hyperlipidemia, unspecified: Secondary | ICD-10-CM

## 2017-07-07 DIAGNOSIS — G43109 Migraine with aura, not intractable, without status migrainosus: Secondary | ICD-10-CM

## 2017-07-07 MED ORDER — SUMATRIPTAN SUCCINATE 100 MG PO TABS: 100.0000 mg | ORAL_TABLET | ORAL | 1 refills | Status: DC | PRN

## 2017-07-07 MED FILL — SUMATRIPTAN SUCC 100 MG TAB: 100 | 30 days supply | Qty: 9 | Fill #0

## 2017-07-07 NOTE — Patient Instructions (Addendum)
Repeat fasting lipid in 6 months  F/U 1 year for Physical

## 2017-07-07 NOTE — Assessment & Plan Note (Signed)
Recheck in 6 months Has family history hyperlipidemia Discussed if her LDL continues to increase recommend starting statin drug

## 2017-07-07 NOTE — Assessment & Plan Note (Signed)
Continue vitamin D, weight bearing exercise Repeat in 2020

## 2017-07-07 NOTE — Progress Notes (Signed)
   Subjective:    Patient ID: Kylie Duffy, female    DOB: 02-27-1955, 62 y.o.   MRN: [id number]  Patient presents for CPE (has had labs)   Pt seeing Dr. Loanne Drilling, for the enlarged pituitary at this point her hormones are normal she is asymptomatic she is just on surveillance   Dr. Jeani Sow him secondary to continued wrist pain.  She had a CT scan recently that showed arthritis in the hand she was given prednisone taper.  She is up-to-date with her OB/GYN mammogram is up-to-date.  She had bone density done last year showing osteopenia she is taking her vitamin D she is doing weightbearing exercises.  Fasting labs are reviewed she does have mild hyperlipidemia which is wearing her family.  She states that she made significant dietary changes but was eating more foods with healthier fats.  Medications  and history reviewed  Medications she declined shingles vaccine will check with employee health about her tetanus booster.            Review Of Systems:  GEN- denies fatigue, fever, weight loss,weakness, recent illness HEENT- denies eye drainage, change in vision, nasal discharge, CVS- denies chest pain, palpitations RESP- denies SOB, cough, wheeze ABD- denies N/V, change in stools, abd pain GU- denies dysuria, hematuria, dribbling, incontinence MSK- denies joint pain, muscle aches, injury Neuro- denies headache, dizziness, syncope, seizure activity       Objective:    BP 110/60   Pulse 72   Temp 98.3 F (36.8 C) (Oral)   Resp 14   Ht 5' 3.5" (1.613 m)   Wt 132 lb (59.9 kg)   SpO2 97%   BMI 23.02 kg/m  GEN- NAD, alert and oriented x3 HEENT- PERRL, EOMI, non injected sclera, pink conjunctiva, MMM, oropharynx clear Neck- Supple, no thyromegaly CVS- RRR, no murmur RESP-CTAB ABD-NABS,soft,NT,ND EXT- No edema Pulses- Radial, DP- 2+        Assessment & Plan:      Problem List Items Addressed This Visit      Unprioritized   Migraines    Rare use of imitrex,  refilled today       Relevant Medications   SUMAtriptan (IMITREX) 100 MG tablet   Mild hyperlipidemia    Recheck in 6 months Has family history hyperlipidemia Discussed if her LDL continues to increase recommend starting statin drug      Osteopenia    Continue vitamin D, weight bearing exercise Repeat in 2020       Other Visit Diagnoses    Routine general medical examination at a health care facility    -  Primary   CPE done, fasting labs reviewed, check with her job about TDAP      Note: This dictation was prepared with Dragon dictation along with smaller phrase technology. Any transcriptional errors that result from this process are unintentional.

## 2017-07-07 NOTE — Assessment & Plan Note (Signed)
Rare use of imitrex, refilled today

## 2017-07-08 LAB — ALPHA SUBUNIT (FREE): Alpha Subunit (Free): 1.6 ng/mL

## 2017-08-26 DIAGNOSIS — L570 Actinic keratosis: Secondary | ICD-10-CM | POA: Diagnosis not present

## 2017-08-26 DIAGNOSIS — D225 Melanocytic nevi of trunk: Secondary | ICD-10-CM | POA: Diagnosis not present

## 2017-08-26 DIAGNOSIS — X32XXXD Exposure to sunlight, subsequent encounter: Secondary | ICD-10-CM | POA: Diagnosis not present

## 2017-08-26 DIAGNOSIS — Z1283 Encounter for screening for malignant neoplasm of skin: Secondary | ICD-10-CM | POA: Diagnosis not present

## 2017-08-26 DIAGNOSIS — D485 Neoplasm of uncertain behavior of skin: Secondary | ICD-10-CM | POA: Diagnosis not present

## 2017-08-26 DIAGNOSIS — L989 Disorder of the skin and subcutaneous tissue, unspecified: Secondary | ICD-10-CM | POA: Diagnosis not present

## 2017-09-09 DIAGNOSIS — L988 Other specified disorders of the skin and subcutaneous tissue: Secondary | ICD-10-CM | POA: Diagnosis not present

## 2017-09-09 DIAGNOSIS — D485 Neoplasm of uncertain behavior of skin: Secondary | ICD-10-CM | POA: Diagnosis not present

## 2017-09-17 DIAGNOSIS — H5213 Myopia, bilateral: Secondary | ICD-10-CM | POA: Diagnosis not present

## 2017-09-17 DIAGNOSIS — H52203 Unspecified astigmatism, bilateral: Secondary | ICD-10-CM | POA: Diagnosis not present

## 2017-09-17 DIAGNOSIS — H524 Presbyopia: Secondary | ICD-10-CM | POA: Diagnosis not present

## 2017-09-17 MED FILL — SM BLOOD PRESSURE MONITOR: 30 days supply | Qty: 1 | Fill #0

## 2017-12-28 ENCOUNTER — Encounter: Payer: Self-pay | Admitting: Endocrinology

## 2017-12-28 ENCOUNTER — Ambulatory Visit: Payer: 59 | Admitting: Endocrinology

## 2017-12-28 VITALS — BP 128/82 | HR 75 | Ht 64.0 in | Wt 132.4 lb

## 2017-12-28 DIAGNOSIS — E237 Disorder of pituitary gland, unspecified: Secondary | ICD-10-CM | POA: Diagnosis not present

## 2017-12-28 NOTE — Progress Notes (Signed)
Subjective:    Patient ID: Kylie Duffy, female    DOB: 11-23-1955, 62 y.o.   MRN: [id number]  HPI Pt returns for f/u of pituitary enlargement (dx'ed 2018, when she had MRI of the brain, after a traumatic injury; no adenoma was found; pituitary function studies were normal).  pt states she feels well in general.  No recent headache Past Medical History:  Diagnosis Date  . Burning with urination 11/01/2014  . Hematuria 07/28/2013  . History of UTI   . Osteopenia   . PONV (postoperative nausea and vomiting)   . Vaginal dryness 12/03/2012    Past Surgical History:  Procedure Laterality Date  . ABDOMINAL HYSTERECTOMY    . BREAST BIOPSY Left   . COLONOSCOPY N/A 08/25/2016   Procedure: COLONOSCOPY;  Surgeon: Danie Binder, MD;  Location: [facility];  Service: Endoscopy;  Laterality: N/A;  . DIAGNOSTIC LAPAROSCOPY    . KNEE ARTHROSCOPY WITH MEDIAL MENISECTOMY  01/26/2012   Procedure: KNEE ARTHROSCOPY WITH MEDIAL MENISECTOMY;  Surgeon: Carole Civil, MD;  Location: [facility];  Service: Orthopedics;  Laterality: Left;  . POLYPECTOMY  08/25/2016   Procedure: POLYPECTOMY;  Surgeon: Danie Binder, MD;  Location: [facility];  Service: Endoscopy;;  . TONSILLECTOMY      Social History   Socioeconomic History  . Marital status: Married    Spouse name: Not on file  . Number of children: Not on file  . Years of education: Not on file  . Highest education level: Not on file  Occupational History  . Not on file  Social Needs  . Financial resource strain: Not on file  . Food insecurity:    Worry: Not on file    Inability: Not on file  . Transportation needs:    Medical: Not on file    Non-medical: Not on file  Tobacco Use  . Smoking status: Never Smoker  . Smokeless tobacco: Never Used  Substance and Sexual Activity  . Alcohol use: Yes    Comment: occ glass of wine  . Drug use: No  . Sexual activity: Not Currently    Birth control/protection: Surgical    Comment: hyst    Lifestyle  . Physical activity:    Days per week: Not on file    Minutes per session: Not on file  . Stress: Not on file  Relationships  . Social connections:    Talks on phone: Not on file    Gets together: Not on file    Attends religious service: Not on file    Active member of club or organization: Not on file    Attends meetings of clubs or organizations: Not on file    Relationship status: Not on file  . Intimate partner violence:    Fear of current or ex partner: Not on file    Emotionally abused: Not on file    Physically abused: Not on file    Forced sexual activity: Not on file  Other Topics Concern  . Not on file  Social History Narrative  . Not on file    Current Outpatient Medications on File Prior to Visit  Medication Sig Dispense Refill  . Calcium Carbonate-Vitamin D (CALCIUM 600 + D PO) Take 1 tablet by mouth daily.    Marland Kitchen ibuprofen (ADVIL,MOTRIN) 200 MG tablet Take 400 mg by mouth daily as needed for headache or moderate pain.    . Multiple Vitamin (MULITIVITAMIN WITH MINERALS) TABS Take 1 tablet by mouth  daily.    . SUMAtriptan (IMITREX) 100 MG tablet Take 1 tablet (100 mg total) by mouth every 2 (two) hours as needed for migraine or headache. May repeat in 2 hours if needed. 10 tablet 1  . Vitamin D, Cholecalciferol, 400 units CAPS Take 1,000 Units by mouth 2 (two) times daily.      No current facility-administered medications on file prior to visit.     No Known Allergies  Family History  Problem Relation Age of Onset  . Hypertension Mother   . Hyperlipidemia Mother   . Thyroid disease Mother   . Kidney disease Mother   . Heart disease Father   . Hyperlipidemia Father   . Cancer Maternal Aunt   . Cancer Maternal Uncle   . Cancer Paternal Aunt   . Cancer Paternal Uncle   . Diabetes Maternal Grandmother   . Cancer Paternal Grandmother        pancreatic  . Hyperlipidemia Brother        borderline  . Cancer Paternal Uncle   . Alzheimer's disease  Paternal Aunt   . Colon cancer Neg Hx   . Colon polyps Neg Hx   . Breast cancer Neg Hx     BP 128/82 (BP Location: Left Arm, Patient Position: Sitting, Cuff Size: Normal)   Pulse 75   Ht 5\' 4"  (1.626 m)   Wt 132 lb 6.4 oz (60.1 kg)   SpO2 96%   BMI 22.73 kg/m    Review of Systems Denies visual loss.      Objective:   Physical Exam VITAL SIGNS:  See vs page GENERAL: no distress GAIT: normal and steady      Assessment & Plan:  Pituitary swelling, seen on MRI.  Normal pituitary function.  Patient Instructions  The only blood tests you need are your regular blood tests at Dr Dorian Heckle office. We can hold off on repeating the MRI, as it was just mildly abnormal. I would be happy to see you back here as needed.

## 2017-12-28 NOTE — Patient Instructions (Addendum)
The only blood tests you need are your regular blood tests at Dr Dorian Heckle office. We can hold off on repeating the MRI, as it was just mildly abnormal. I would be happy to see you back here as needed.

## 2018-01-04 DIAGNOSIS — D225 Melanocytic nevi of trunk: Secondary | ICD-10-CM | POA: Diagnosis not present

## 2018-01-04 DIAGNOSIS — Z1283 Encounter for screening for malignant neoplasm of skin: Secondary | ICD-10-CM | POA: Diagnosis not present

## 2018-01-08 ENCOUNTER — Other Ambulatory Visit: Payer: 59

## 2018-01-25 ENCOUNTER — Other Ambulatory Visit: Payer: 59

## 2018-01-25 DIAGNOSIS — E785 Hyperlipidemia, unspecified: Secondary | ICD-10-CM

## 2018-01-25 DIAGNOSIS — Z79899 Other long term (current) drug therapy: Secondary | ICD-10-CM

## 2018-01-26 LAB — LIPID PANEL
CHOL/HDL RATIO: 3.8 (calc) (ref <5.0)
CHOLESTEROL: 238 mg/dL — AB (ref <200)
HDL: 62 mg/dL (ref >50)
LDL CHOLESTEROL (CALC): 160 mg/dL — AB
NON-HDL CHOLESTEROL (CALC): 176 mg/dL — AB (ref <130)
Triglycerides: 66 mg/dL (ref <150)

## 2018-01-26 LAB — COMPREHENSIVE METABOLIC PANEL
AG Ratio: 1.5 (calc) (ref 1.0–2.5)
ALKALINE PHOSPHATASE (APISO): 67 U/L (ref 33–130)
ALT: 12 U/L (ref 6–29)
AST: 19 U/L (ref 10–35)
Albumin: 4 g/dL (ref 3.6–5.1)
BUN: 20 mg/dL (ref 7–25)
CO2: 26 mmol/L (ref 20–32)
CREATININE: 0.89 mg/dL (ref 0.50–0.99)
Calcium: 9.2 mg/dL (ref 8.6–10.4)
Chloride: 103 mmol/L (ref 98–110)
Globulin: 2.6 g/dL (calc) (ref 1.9–3.7)
Glucose, Bld: 76 mg/dL (ref 65–99)
Potassium: 4.5 mmol/L (ref 3.5–5.3)
SODIUM: 139 mmol/L (ref 135–146)
Total Bilirubin: 0.4 mg/dL (ref 0.2–1.2)
Total Protein: 6.6 g/dL (ref 6.1–8.1)

## 2018-01-26 LAB — CBC WITH DIFFERENTIAL/PLATELET
ABSOLUTE MONOCYTES: 400 {cells}/uL (ref 200–950)
Basophils Absolute: 26 cells/uL (ref 0–200)
Basophils Relative: 0.3 %
EOS PCT: 1 %
Eosinophils Absolute: 87 cells/uL (ref 15–500)
HEMATOCRIT: 40.2 % (ref 35.0–45.0)
Hemoglobin: 13.2 g/dL (ref 11.7–15.5)
Lymphs Abs: 1879 cells/uL (ref 850–3900)
MCH: 29.4 pg (ref 27.0–33.0)
MCHC: 32.8 g/dL (ref 32.0–36.0)
MCV: 89.5 fL (ref 80.0–100.0)
MPV: 10.3 fL (ref 7.5–12.5)
Monocytes Relative: 4.6 %
Neutro Abs: 6308 cells/uL (ref 1500–7800)
Neutrophils Relative %: 72.5 %
PLATELETS: 518 10*3/uL — AB (ref 140–400)
RBC: 4.49 10*6/uL (ref 3.80–5.10)
RDW: 13.5 % (ref 11.0–15.0)
TOTAL LYMPHOCYTE: 21.6 %
WBC: 8.7 10*3/uL (ref 3.8–10.8)

## 2018-01-28 ENCOUNTER — Other Ambulatory Visit: Payer: Self-pay | Admitting: *Deleted

## 2018-01-28 DIAGNOSIS — E785 Hyperlipidemia, unspecified: Secondary | ICD-10-CM

## 2018-01-28 MED ORDER — ATORVASTATIN CALCIUM 20 MG PO TABS: 20.0000 mg | ORAL_TABLET | Freq: Every day | ORAL | 3 refills | Status: DC

## 2018-01-29 ENCOUNTER — Encounter: Payer: Self-pay | Admitting: Family Medicine

## 2018-02-11 ENCOUNTER — Ambulatory Visit (INDEPENDENT_AMBULATORY_CARE_PROVIDER_SITE_OTHER): Payer: 59 | Admitting: Adult Health

## 2018-02-11 ENCOUNTER — Encounter: Payer: Self-pay | Admitting: Adult Health

## 2018-02-11 VITALS — BP 120/71 | HR 68 | Ht 62.5 in | Wt 130.0 lb

## 2018-02-11 DIAGNOSIS — Z1212 Encounter for screening for malignant neoplasm of rectum: Secondary | ICD-10-CM

## 2018-02-11 DIAGNOSIS — Z1211 Encounter for screening for malignant neoplasm of colon: Secondary | ICD-10-CM

## 2018-02-11 DIAGNOSIS — Z01419 Encounter for gynecological examination (general) (routine) without abnormal findings: Secondary | ICD-10-CM | POA: Diagnosis not present

## 2018-02-11 LAB — HEMOCCULT GUIAC POC 1CARD (OFFICE): FECAL OCCULT BLD: NEGATIVE

## 2018-02-11 NOTE — Progress Notes (Signed)
Patient ID: Kylie Duffy, female   DOB: [birth year], 63 y.o.   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 PCP is Dr Buelah Manis.   Current Medications, Allergies, Past Medical History, Past Surgical History, Family History and Social History were reviewed in [facility] record.     Review of Systems: Patient denies any headaches, hearing loss, fatigue, blurred vision, shortness of breath, chest pain, abdominal pain, problems with bowel movements, urination, or intercourse(sex not comfortable). No joint pain or mood swings.    Physical Exam:BP 120/71 (BP Location: Left Arm, Patient Position: Sitting, Cuff Size: Normal)   Pulse 68   Ht 5' 2.5" (1.588 m)   Wt 130 lb (59 kg)   BMI 23.40 kg/m  General:  Well developed, well nourished, no acute distress Skin:  Warm and dry Neck:  Midline trachea, normal thyroid, good ROM, no lymphadenopathy,no carotid bruits heard. Lungs; Clear to auscultation bilaterally Breast:  No dominant palpable mass, retraction, or nipple discharge Cardiovascular: Regular rate and rhythm Abdomen:  Soft, non tender, no hepatosplenomegaly Pelvic:  External genitalia is normal in appearance, no lesions.  The vagina pale, with loss of moisture and rugae. Urethra has no lesions or masses. The cervix and uterus are absent.  No adnexal masses or tenderness noted.Bladder is non tender, no masses felt. Rectal: Good sphincter tone, no polyps, or hemorrhoids felt.  Hemoccult negative. Extremities/musculoskeletal:  No swelling or varicosities noted, no clubbing or cyanosis Psych:  No mood changes, alert and cooperative,seems happy PHQ 2 score 0. Fall risk is low.  Examination chaperoned by Levy Pupa LPN.  Impression: 1. Well woman exam with routine gynecological exam   2. Screening for colorectal cancer        Plan: Physical in 1 year Mammogram in February Labs with PCP

## 2018-03-04 ENCOUNTER — Other Ambulatory Visit: Payer: Self-pay | Admitting: Adult Health

## 2018-03-04 DIAGNOSIS — Z1231 Encounter for screening mammogram for malignant neoplasm of breast: Secondary | ICD-10-CM

## 2018-03-12 ENCOUNTER — Ambulatory Visit
Admission: RE | Admit: 2018-03-12 | Discharge: 2018-03-12 | Disposition: A | Discharge status: Home / Self Care | Payer: 59 | Source: Ambulatory Visit | Attending: Adult Health | Admitting: Adult Health

## 2018-03-12 DIAGNOSIS — Z1231 Encounter for screening mammogram for malignant neoplasm of breast: Secondary | ICD-10-CM | POA: Diagnosis not present

## 2018-03-23 ENCOUNTER — Ambulatory Visit: Payer: 59

## 2018-03-31 ENCOUNTER — Encounter: Payer: Self-pay | Admitting: Adult Health

## 2018-03-31 ENCOUNTER — Ambulatory Visit: Payer: 59 | Admitting: Adult Health

## 2018-03-31 VITALS — BP 130/84 | HR 82 | Temp 98.2°F | Ht 62.5 in | Wt 129.0 lb

## 2018-03-31 DIAGNOSIS — R319 Hematuria, unspecified: Secondary | ICD-10-CM | POA: Diagnosis not present

## 2018-03-31 DIAGNOSIS — R350 Frequency of micturition: Secondary | ICD-10-CM

## 2018-03-31 DIAGNOSIS — R300 Dysuria: Secondary | ICD-10-CM | POA: Diagnosis not present

## 2018-03-31 DIAGNOSIS — R3915 Urgency of urination: Secondary | ICD-10-CM | POA: Diagnosis not present

## 2018-03-31 DIAGNOSIS — N898 Other specified noninflammatory disorders of vagina: Secondary | ICD-10-CM

## 2018-03-31 LAB — POCT URINALYSIS DIPSTICK
Glucose, UA: NEGATIVE
Ketones, UA: NEGATIVE
NITRITE UA: NEGATIVE
PROTEIN UA: NEGATIVE

## 2018-03-31 MED ORDER — PHENAZOPYRIDINE HCL 200 MG PO TABS: 200.0000 mg | ORAL_TABLET | Freq: Three times a day (TID) | ORAL | 0 refills | Status: DC | PRN

## 2018-03-31 MED ORDER — CIPROFLOXACIN HCL 500 MG PO TABS: 500.0000 mg | ORAL_TABLET | Freq: Two times a day (BID) | ORAL | 0 refills | Status: DC

## 2018-03-31 MED FILL — CIPROFLOXACIN HCL 500 MG TA: 500 | 7 days supply | Qty: 14 | Fill #0

## 2018-03-31 MED FILL — PHENAZOPYRIDINE 200 MG TAB: 200 | 3 days supply | Qty: 10 | Fill #0

## 2018-03-31 NOTE — Progress Notes (Signed)
Patient ID: Kylie Duffy, female   DOB: Nov 15, 1955, 63 y.o.   MRN: [id number] History of Present Illness: Shandell is a 63 year old white female, married, sp hysterectomy in complaining of urinary frequency,urgency,pressure and burning with urination for about a week. PCP is Dr Buelah Manis.    Current Medications, Allergies, Past Medical History, Past Surgical History, Family History and Social History were reviewed in [facility] record.     Review of Systems: +urinary frequency and urgency +pressure  +burning with urination for about a week    Physical Exam:BP 130/84 (BP Location: Left Arm, Patient Position: Sitting, Cuff Size: Normal)   Pulse 82   Temp 98.2 F (36.8 C)   Ht 5' 2.5" (1.588 m)   Wt 129 lb (58.5 kg)   BMI 23.22 kg/m Urine dipstick 2+leuks, 1+ blood. General:  Well developed, well nourished, no acute distress Skin:  Warm and dry Abdomen:  Soft, non tender, no CVAT Pelvic:  External genitalia is normal in appearance, no lesions.  The vagina is pale and dry.Marland Kitchen Urethra has no lesions or masses. The cervix and uterus are absent,no tenderness over bladder,did not do speculum exam.  Psych:  No mood changes, alert and cooperative,seems happy Examination chaperoned by Levy Pupa LPN.   Impression:  1. Hematuria, unspecified type   2. Burning with urination   3. Urinary frequency   4. Urinary urgency   5. Vaginal dryness      Plan:  UA C&S sent Push Fluids Meds ordered this encounter  Medications  . ciprofloxacin (CIPRO) 500 MG tablet    Sig: Take 1 tablet (500 mg total) by mouth 2 (two) times daily.    Dispense:  14 tablet    Refill:  0    Order Specific Question:   Supervising Provider    Answer:   Elonda Husky, LUTHER H [[id number]]  . phenazopyridine (PYRIDIUM) 200 MG tablet    Sig: Take 1 tablet (200 mg total) by mouth 3 (three) times daily as needed for pain.    Dispense:  10 tablet    Refill:  0    Order Specific Question:   Supervising Provider     Answer:   Tania Ade H [[id number]]  F/U prn

## 2018-04-01 LAB — URINALYSIS, ROUTINE W REFLEX MICROSCOPIC
Bilirubin, UA: NEGATIVE
Glucose, UA: NEGATIVE
Ketones, UA: NEGATIVE
NITRITE UA: NEGATIVE
Protein, UA: NEGATIVE
Specific Gravity, UA: 1.018 (ref 1.005–1.030)
Urobilinogen, Ur: 0.2 mg/dL (ref 0.2–1.0)
pH, UA: 5.5 (ref 5.0–7.5)

## 2018-04-01 LAB — MICROSCOPIC EXAMINATION
Casts: NONE SEEN /lpf
WBC, UA: >30 /hpf — AB (ref 0–5)

## 2018-04-02 LAB — URINE CULTURE

## 2018-05-04 ENCOUNTER — Other Ambulatory Visit: Payer: Self-pay

## 2018-05-04 ENCOUNTER — Encounter: Payer: Self-pay | Admitting: Family Medicine

## 2018-07-06 ENCOUNTER — Other Ambulatory Visit: Payer: Self-pay

## 2018-07-06 ENCOUNTER — Other Ambulatory Visit: Payer: 59

## 2018-07-06 DIAGNOSIS — E785 Hyperlipidemia, unspecified: Secondary | ICD-10-CM | POA: Diagnosis not present

## 2018-07-07 LAB — COMPLETE METABOLIC PANEL WITH GFR
AG Ratio: 1.7 (calc) (ref 1.0–2.5)
ALT: 14 U/L (ref 6–29)
AST: 23 U/L (ref 10–35)
Albumin: 4.3 g/dL (ref 3.6–5.1)
Alkaline phosphatase (APISO): 57 U/L (ref 37–153)
BUN: 16 mg/dL (ref 7–25)
CO2: 25 mmol/L (ref 20–32)
Calcium: 9.1 mg/dL (ref 8.6–10.4)
Chloride: 103 mmol/L (ref 98–110)
Creat: 0.81 mg/dL (ref 0.50–0.99)
GFR, Est African American: 90 mL/min/{1.73_m2} (ref >=60)
GFR, Est Non African American: 78 mL/min/{1.73_m2} (ref >=60)
Globulin: 2.5 g/dL (calc) (ref 1.9–3.7)
Glucose, Bld: 81 mg/dL (ref 65–99)
Potassium: 4.7 mmol/L (ref 3.5–5.3)
Sodium: 138 mmol/L (ref 135–146)
Total Bilirubin: 0.5 mg/dL (ref 0.2–1.2)
Total Protein: 6.8 g/dL (ref 6.1–8.1)

## 2018-07-07 LAB — LIPID PANEL
Cholesterol: 245 mg/dL — ABNORMAL HIGH (ref <200)
HDL: 59 mg/dL (ref >=50)
LDL Cholesterol (Calc): 168 mg/dL (calc) — ABNORMAL HIGH
Non-HDL Cholesterol (Calc): 186 mg/dL (calc) — ABNORMAL HIGH (ref <130)
Total CHOL/HDL Ratio: 4.2 (calc) (ref <5.0)
Triglycerides: 79 mg/dL (ref <150)

## 2018-07-07 LAB — EXTRA LAV TOP TUBE

## 2018-07-13 ENCOUNTER — Other Ambulatory Visit: Payer: Self-pay

## 2018-07-13 ENCOUNTER — Ambulatory Visit (INDEPENDENT_AMBULATORY_CARE_PROVIDER_SITE_OTHER): Payer: 59 | Admitting: Family Medicine

## 2018-07-13 ENCOUNTER — Encounter: Payer: Self-pay | Admitting: Family Medicine

## 2018-07-13 VITALS — BP 118/62 | HR 88 | Temp 98.1°F | Resp 14 | Ht 62.5 in | Wt 124.0 lb

## 2018-07-13 DIAGNOSIS — Z0000 Encounter for general adult medical examination without abnormal findings: Secondary | ICD-10-CM

## 2018-07-13 DIAGNOSIS — G43109 Migraine with aura, not intractable, without status migrainosus: Secondary | ICD-10-CM

## 2018-07-13 DIAGNOSIS — M8589 Other specified disorders of bone density and structure, multiple sites: Secondary | ICD-10-CM | POA: Diagnosis not present

## 2018-07-13 DIAGNOSIS — E785 Hyperlipidemia, unspecified: Secondary | ICD-10-CM

## 2018-07-13 DIAGNOSIS — Z0001 Encounter for general adult medical examination with abnormal findings: Secondary | ICD-10-CM | POA: Diagnosis not present

## 2018-07-13 DIAGNOSIS — Z23 Encounter for immunization: Secondary | ICD-10-CM | POA: Diagnosis not present

## 2018-07-13 LAB — CBC WITH DIFFERENTIAL/PLATELET
Absolute Monocytes: 354 cells/uL (ref 200–950)
Basophils Absolute: 61 cells/uL (ref 0–200)
Basophils Relative: 0.9 %
Eosinophils Absolute: 109 cells/uL (ref 15–500)
Eosinophils Relative: 1.6 %
HCT: 41.8 % (ref 35.0–45.0)
Hemoglobin: 13.7 g/dL (ref 11.7–15.5)
Lymphs Abs: 1822 cells/uL (ref 850–3900)
MCH: 29.2 pg (ref 27.0–33.0)
MCHC: 32.8 g/dL (ref 32.0–36.0)
MCV: 89.1 fL (ref 80.0–100.0)
MPV: 10.4 fL (ref 7.5–12.5)
Monocytes Relative: 5.2 %
Neutro Abs: 4454 cells/uL (ref 1500–7800)
Neutrophils Relative %: 65.5 %
Platelets: 481 10*3/uL — ABNORMAL HIGH (ref 140–400)
RBC: 4.69 10*6/uL (ref 3.80–5.10)
RDW: 13.3 % (ref 11.0–15.0)
Total Lymphocyte: 26.8 %
WBC: 6.8 10*3/uL (ref 3.8–10.8)

## 2018-07-13 MED ORDER — SUMATRIPTAN SUCCINATE 100 MG PO TABS: 100.0000 mg | ORAL_TABLET | ORAL | 1 refills | Status: DC | PRN

## 2018-07-13 MED FILL — SUMAtriptan SUCCINATE 100 M: 100 | 30 days supply | Qty: 9 | Fill #0

## 2018-07-13 NOTE — Assessment & Plan Note (Signed)
I do recommend statin drug however she wants to continue to work on things naturally.  Recommend that she do increase her red yeast rice to the appropriate dose over-the-counter.  She would also like a referral to a dietitian to see if there is anything else she can change in her diet.  I do think there is also  underlying familiar component as multiple family members also have high cholesterol.

## 2018-07-13 NOTE — Progress Notes (Signed)
   Subjective:    Patient ID: Kylie Duffy, female    DOB: 29-Jan-1955, 63 y.o.   MRN: [id number]  Patient presents for Annual Exam (has had labs) Pt here for CPE   Medications and history reviewed    Recent labs reviewed   Cholesterol still elevated, LDL now  168,previous 160, I had recommended lipitor at her visit 6 months She has been taking a low dose of red yeast rice.   Wants to get referred to dietician   CMET normal   Both parents and brother has had high cholesterol    Mammogram UTD GYN family tree  Due for TDAP/ declines shingles vaccine Hep C screen negative 2017  Colonoscopy UTD   Migraines- has imitirex on hand , has not used very often   Taking calcium and vitamin D - has osteopenia on bone density July 2018  Weight down 6lbs since Jan , she has been working on dietary changes and trying to lose some weight, but has had stress   No longer following with endocrinology   Eye doctor- UTD  Review Of Systems:  GEN- denies fatigue, fever, weight loss,weakness, recent illness HEENT- denies eye drainage, change in vision, nasal discharge, CVS- denies chest pain, palpitations RESP- denies SOB, cough, wheeze ABD- denies N/V, change in stools, abd pain GU- denies dysuria, hematuria, dribbling, incontinence MSK- denies joint pain, muscle aches, injury Neuro- denies headache, dizziness, syncope, seizure activity       Objective:    BP 118/62   Pulse 88   Temp 98.1 F (36.7 C) (Oral)   Resp 14   Ht 5' 2.5" (1.588 m)   Wt 124 lb (56.2 kg)   SpO2 99%   BMI 22.32 kg/m  GEN- NAD, alert and oriented x3 HEENT- PERRL, EOMI, non injected sclera, pink conjunctiva, MMM, oropharynx clear Neck- Supple, no thyromegaly CVS- RRR, no murmur RESP-CTAB ABD-NABS,soft,NT,ND EXT- No edema Pulses- Radial, DP- 2+        Assessment & Plan:      Problem List Items Addressed This Visit      Unprioritized   Hyperlipidemia    I do recommend statin drug however she  wants to continue to work on things naturally.  Recommend that she do increase her red yeast rice to the appropriate dose over-the-counter.  She would also like a referral to a dietitian to see if there is anything else she can change in her diet.  I do think there is also  underlying familiar component as multiple family members also have high cholesterol.      Migraines    Prn imitrex       Relevant Medications   SUMAtriptan (IMITREX) 100 MG tablet   Osteopenia    Bone density to be scheduled by patient F/U osteopenia Continue calcium and vitamin D         Other Visit Diagnoses    Routine general medical examination at a health care facility    -  Primary   TDAP given, add CBC    Relevant Orders   CBC with Differential/Platelet      Note: This dictation was prepared with Dragon dictation along with smaller phrase technology. Any transcriptional errors that result from this process are unintentional.

## 2018-07-13 NOTE — Assessment & Plan Note (Signed)
Prn imitrex

## 2018-07-13 NOTE — Assessment & Plan Note (Signed)
Bone density to be scheduled by patient F/U osteopenia Continue calcium and vitamin D

## 2018-07-13 NOTE — Patient Instructions (Addendum)
Schedule your bone Density  Referral to nutritionist  F/U 1 year for physical 6 months for lipid

## 2018-07-13 NOTE — Addendum Note (Signed)
Addended by: Sheral Flow on: 07/13/2018 10:27 AM   Modules accepted: Orders

## 2018-07-15 ENCOUNTER — Encounter: Payer: Self-pay | Admitting: *Deleted

## 2018-07-29 ENCOUNTER — Encounter: Payer: Self-pay | Admitting: Nutrition

## 2018-07-29 ENCOUNTER — Encounter: Payer: 59 | Attending: Family Medicine | Admitting: Nutrition

## 2018-07-29 DIAGNOSIS — E782 Mixed hyperlipidemia: Secondary | ICD-10-CM | POA: Insufficient documentation

## 2018-07-29 NOTE — Patient Instructions (Signed)
Goals Follow High Fiber Diet Aim for 25-30 grams of fiber per day Look into [place] Avoid red meat and processed foods

## 2018-07-29 NOTE — Progress Notes (Signed)
  Medical Nutrition Therapy:  Appt start time: 3299 end time:  1615.   Assessment:  Primary concerns today: Hyperlipidemia. LIves with her husband and family. Eats three meals per day. Eats mostly at home.  TCHOL 245 mg/dl, LDL 168 mg/dl. Family history of heart disease. Wants to lower her CHOL with diet as best she can following a high fiber diet. . Wt Readings from Last 3 Encounters:  07/13/18 124 lb (56.2 kg)  03/31/18 129 lb (58.5 kg)  02/11/18 130 lb (59 kg)   Ht Readings from Last 3 Encounters:  07/13/18 5' 2.5" (1.588 m)  03/31/18 5' 2.5" (1.588 m)  02/11/18 5' 2.5" (1.588 m)   There is no height or weight on file to calculate BMI. @BMIFA @ Facility age limit for growth percentiles is 20 years. Facility age limit for growth percentiles is 20 years.  Lipid Panel     Component Value Date/Time   CHOL 245 (H) 07/06/2018 0818   TRIG 79 07/06/2018 0818   HDL 59 07/06/2018 0818   CHOLHDL 4.2 07/06/2018 0818   VLDL 18 06/25/2016 0811   LDLCALC 168 (H) 07/06/2018 0818   .   Preferred Learning Style:   No preference indicated   Learning Readiness:   Ready  Change in progress   MEDICATIONS:   DIETARY INTAKE:  24-hr recall:  B ( AM): Egg white or egg, or oatmeal or ww toast,, fruit Snk ( AM):  fruit L ( PM): chicken, spring mix salad with LF dressing, water Snk ( PM):  D ( PM):lower carb pasta , salad, cheese, water Snk ( PM) Beverages: water, UNSWT tea,  Usual physical activity:   Estimated energy needs: 1500  calories 170 g carbohydrates 112 g protein 42 g fat  Progress Towards Goal(s):  In progress.   Nutritional Diagnosis:  NB-1.1 Food and nutrition-related knowledge deficit As related to Hyperlipidemia.  As evidenced by TCHO 245 mg/dl, LDL .    Intervention: HIgh Fiber  Low Cholesterol Diet. Reading food labels.  [employer] [employer].  Goals Follow High Fiber Diet Avoid red meat Avoid concentrated sweets and processed  foods Look into [place] [employer] for [employer]. Aim for 25-30 grams of fiber per day.   Teaching Method Utilized:  Visual Auditory  Handouts given during visit include:  High Fiber Low Cholesterol Diet emailed    Barriers to learning/adherence to lifestyle change: none  Demonstrated degree of understanding via:  Teach Back   Monitoring/Evaluation:  Dietary intake, exercise, , and body weight PRN.

## 2018-08-10 DIAGNOSIS — L818 Other specified disorders of pigmentation: Secondary | ICD-10-CM | POA: Diagnosis not present

## 2018-08-10 DIAGNOSIS — D485 Neoplasm of uncertain behavior of skin: Secondary | ICD-10-CM | POA: Diagnosis not present

## 2018-08-10 DIAGNOSIS — Z1283 Encounter for screening for malignant neoplasm of skin: Secondary | ICD-10-CM | POA: Diagnosis not present

## 2018-08-10 DIAGNOSIS — L905 Scar conditions and fibrosis of skin: Secondary | ICD-10-CM | POA: Diagnosis not present

## 2018-08-10 DIAGNOSIS — D2272 Melanocytic nevi of left lower limb, including hip: Secondary | ICD-10-CM | POA: Diagnosis not present

## 2018-08-10 DIAGNOSIS — D225 Melanocytic nevi of trunk: Secondary | ICD-10-CM | POA: Diagnosis not present

## 2018-08-27 ENCOUNTER — Other Ambulatory Visit (HOSPITAL_COMMUNITY): Payer: 59

## 2018-09-09 ENCOUNTER — Other Ambulatory Visit: Payer: Self-pay

## 2018-09-09 ENCOUNTER — Ambulatory Visit (HOSPITAL_COMMUNITY)
Admission: RE | Admit: 2018-09-09 | Discharge: 2018-09-09 | Disposition: A | Discharge status: Home / Self Care | Payer: 59 | Source: Ambulatory Visit | Attending: Family Medicine | Admitting: Family Medicine

## 2018-09-09 DIAGNOSIS — M8589 Other specified disorders of bone density and structure, multiple sites: Secondary | ICD-10-CM | POA: Diagnosis not present

## 2018-09-09 DIAGNOSIS — Z780 Asymptomatic menopausal state: Secondary | ICD-10-CM | POA: Diagnosis not present

## 2018-09-23 DIAGNOSIS — H2513 Age-related nuclear cataract, bilateral: Secondary | ICD-10-CM | POA: Diagnosis not present

## 2018-09-23 DIAGNOSIS — H524 Presbyopia: Secondary | ICD-10-CM | POA: Diagnosis not present

## 2018-09-23 DIAGNOSIS — H5213 Myopia, bilateral: Secondary | ICD-10-CM | POA: Diagnosis not present

## 2018-09-23 DIAGNOSIS — H52203 Unspecified astigmatism, bilateral: Secondary | ICD-10-CM | POA: Diagnosis not present

## 2018-11-28 IMAGING — MG DIGITAL DIAGNOSTIC UNILATERAL RIGHT MAMMOGRAM
4 series · 4 of 4 positions shown · non-contrast
Comparison: Previous exam(s).

CLINICAL DATA: Calcifications in the posterior aspect of the
upper-outer right breast on a recent screening mammogram.

EXAM:
DIGITAL DIAGNOSTIC RIGHT MAMMOGRAM WITH CAD

[R CC]
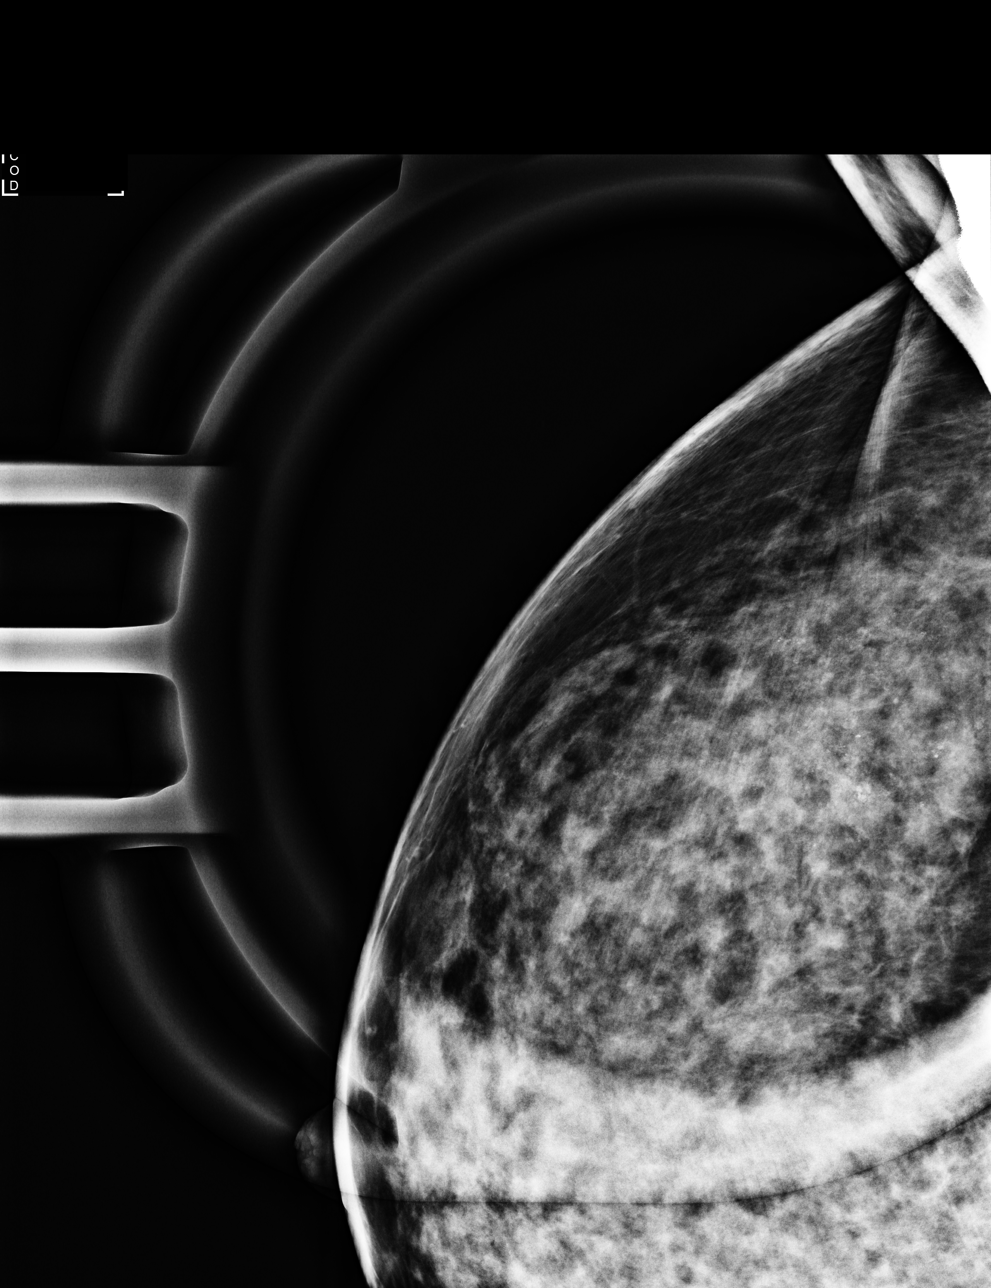

[R ML (1 of 3)]
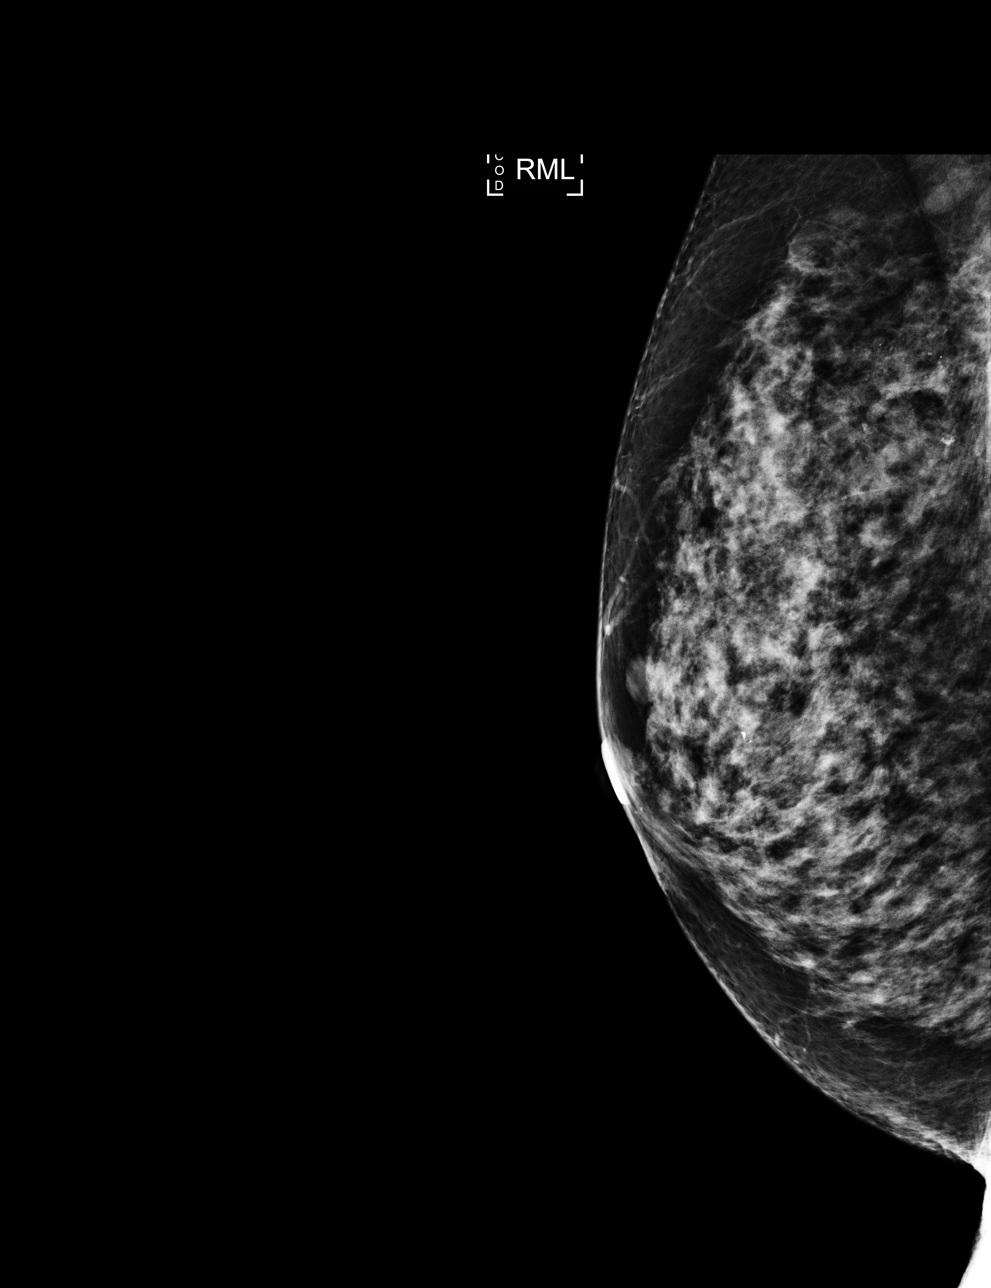

[R ML (2 of 3)]
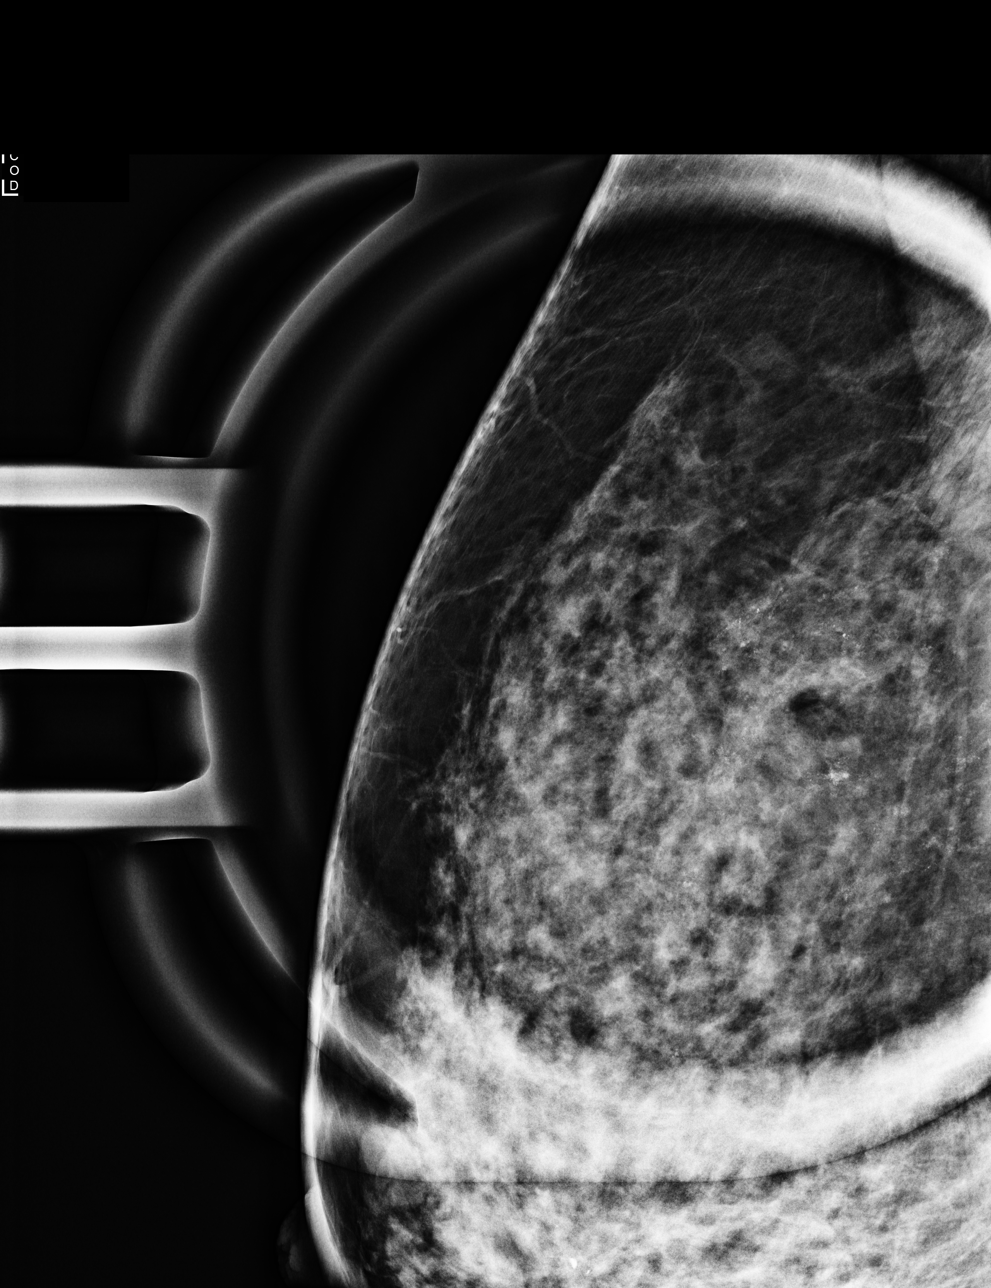

[R ML (3 of 3)]
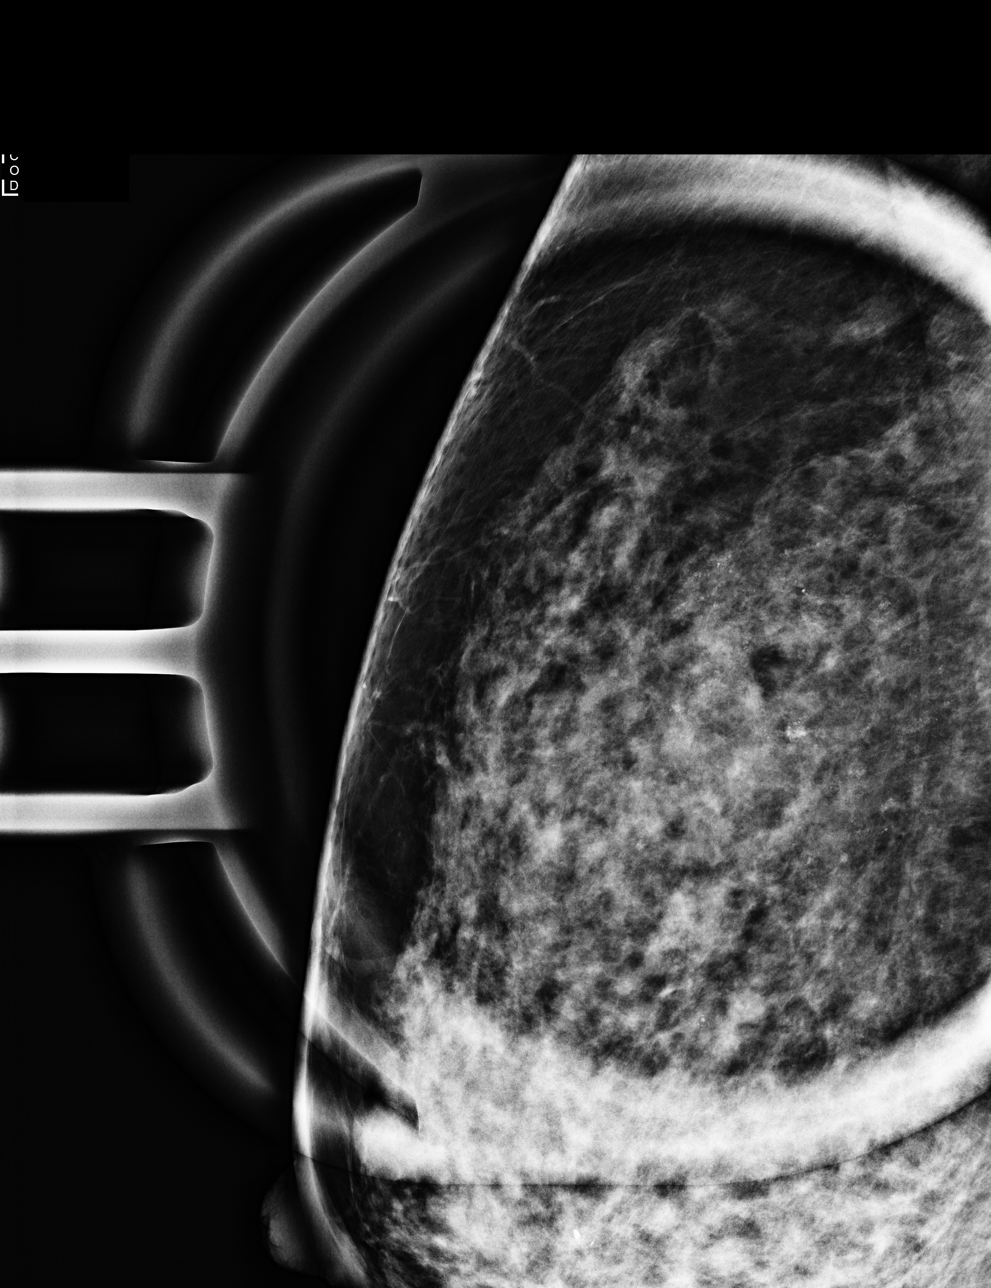

[4 of 4 positions shown; findings below may reference images not displayed]

ACR Breast Density Category d: The breast tissue is extremely dense,
which lowers the sensitivity of mammography.
FINDINGS: 2D true lateral and spot magnification views of the right breast
were obtained. These demonstrate a large number of punctate
calcifications in the posterior aspect of the upper-outer quadrant
of the breast, without significant change compared to previous
examinations dating back to 05/04/2007. These are better defined in
the true lateral projection, compatible with some dependent
layering, better demonstrated on some of the previous examinations.
No interval findings suspicious for malignancy.

Mammographic images were processed with CAD.
IMPRESSION: Stable right breast benign calcifications associated with milk of
calcium associated fibrocystic changes. No evidence of malignancy.

RECOMMENDATION:
Bilateral screening mammogram in 1 year.

I have discussed the findings and recommendations with the patient.
Results were also provided in writing at the conclusion of the
visit. If applicable, a reminder letter will be sent to the patient
regarding the next appointment.

BI-RADS CATEGORY  2: Benign.

## 2018-12-14 DIAGNOSIS — D225 Melanocytic nevi of trunk: Secondary | ICD-10-CM | POA: Diagnosis not present

## 2018-12-14 DIAGNOSIS — B070 Plantar wart: Secondary | ICD-10-CM | POA: Diagnosis not present

## 2018-12-14 DIAGNOSIS — Z1283 Encounter for screening for malignant neoplasm of skin: Secondary | ICD-10-CM | POA: Diagnosis not present

## 2019-01-13 ENCOUNTER — Other Ambulatory Visit: Payer: Self-pay

## 2019-01-13 ENCOUNTER — Other Ambulatory Visit: Payer: Self-pay | Admitting: Family Medicine

## 2019-01-13 ENCOUNTER — Other Ambulatory Visit: Payer: 59

## 2019-01-13 DIAGNOSIS — G43109 Migraine with aura, not intractable, without status migrainosus: Secondary | ICD-10-CM | POA: Diagnosis not present

## 2019-01-13 DIAGNOSIS — E785 Hyperlipidemia, unspecified: Secondary | ICD-10-CM

## 2019-01-13 LAB — CBC WITH DIFFERENTIAL/PLATELET
Absolute Monocytes: 432 cells/uL (ref 200–950)
Basophils Absolute: 88 cells/uL (ref 0–200)
Basophils Relative: 1.1 %
Eosinophils Absolute: 112 cells/uL (ref 15–500)
Eosinophils Relative: 1.4 %
HCT: 41.9 % (ref 35.0–45.0)
Hemoglobin: 13.7 g/dL (ref 11.7–15.5)
Lymphs Abs: 2064 cells/uL (ref 850–3900)
MCH: 29.8 pg (ref 27.0–33.0)
MCHC: 32.7 g/dL (ref 32.0–36.0)
MCV: 91.1 fL (ref 80.0–100.0)
MPV: 10.4 fL (ref 7.5–12.5)
Monocytes Relative: 5.4 %
Neutro Abs: 5304 cells/uL (ref 1500–7800)
Neutrophils Relative %: 66.3 %
Platelets: 581 10*3/uL — ABNORMAL HIGH (ref 140–400)
RBC: 4.6 10*6/uL (ref 3.80–5.10)
RDW: 13.1 % (ref 11.0–15.0)
Total Lymphocyte: 25.8 %
WBC: 8 10*3/uL (ref 3.8–10.8)

## 2019-01-14 LAB — COMPREHENSIVE METABOLIC PANEL
AG Ratio: 1.7 (calc) (ref 1.0–2.5)
ALT: 18 U/L (ref 6–29)
AST: 19 U/L (ref 10–35)
Albumin: 4.3 g/dL (ref 3.6–5.1)
Alkaline phosphatase (APISO): 60 U/L (ref 37–153)
BUN: 17 mg/dL (ref 7–25)
CO2: 25 mmol/L (ref 20–32)
Calcium: 9.4 mg/dL (ref 8.6–10.4)
Chloride: 103 mmol/L (ref 98–110)
Creat: 0.78 mg/dL (ref 0.50–0.99)
Globulin: 2.5 g/dL (calc) (ref 1.9–3.7)
Glucose, Bld: 78 mg/dL (ref 65–99)
Potassium: 4.6 mmol/L (ref 3.5–5.3)
Sodium: 140 mmol/L (ref 135–146)
Total Bilirubin: 0.5 mg/dL (ref 0.2–1.2)
Total Protein: 6.8 g/dL (ref 6.1–8.1)

## 2019-01-18 ENCOUNTER — Encounter: Payer: Self-pay | Admitting: Family Medicine

## 2019-01-19 ENCOUNTER — Other Ambulatory Visit: Payer: Self-pay | Admitting: *Deleted

## 2019-01-19 DIAGNOSIS — E785 Hyperlipidemia, unspecified: Secondary | ICD-10-CM

## 2019-03-18 ENCOUNTER — Other Ambulatory Visit: Payer: Self-pay | Admitting: Adult Health

## 2019-03-18 DIAGNOSIS — Z1231 Encounter for screening mammogram for malignant neoplasm of breast: Secondary | ICD-10-CM

## 2019-03-22 ENCOUNTER — Encounter: Payer: Self-pay | Admitting: Adult Health

## 2019-03-22 ENCOUNTER — Ambulatory Visit (INDEPENDENT_AMBULATORY_CARE_PROVIDER_SITE_OTHER): Payer: 59 | Admitting: Adult Health

## 2019-03-22 ENCOUNTER — Other Ambulatory Visit: Payer: Self-pay

## 2019-03-22 VITALS — BP 114/77 | HR 72 | Ht 62.0 in | Wt 127.5 lb

## 2019-03-22 DIAGNOSIS — Z1212 Encounter for screening for malignant neoplasm of rectum: Secondary | ICD-10-CM | POA: Diagnosis not present

## 2019-03-22 DIAGNOSIS — Z1211 Encounter for screening for malignant neoplasm of colon: Secondary | ICD-10-CM

## 2019-03-22 DIAGNOSIS — Z01419 Encounter for gynecological examination (general) (routine) without abnormal findings: Secondary | ICD-10-CM | POA: Diagnosis not present

## 2019-03-22 LAB — HEMOCCULT GUIAC POC 1CARD (OFFICE): Fecal Occult Blood, POC: NEGATIVE

## 2019-03-22 NOTE — Progress Notes (Signed)
Patient ID: Kylie Duffy, female   DOB: 1955-02-21, 64 y.o.   MRN: [id number] History of Present Illness: Kylie Duffy is a 64 year old white female, married, sp hysterectomy in for a well woman gyn exam.She is still working. Her 63 year old mom lives with her now. PCP is Dr Kylie Duffy   Current Medications, Allergies, Past Medical History, Past Surgical History, Family History and Social History were reviewed in [facility] record.     Review of Systems: Patient denies any headaches, hearing loss, fatigue, blurred vision, shortness of breath, chest pain, abdominal pain, problems with bowel movements, urination, or intercourse(not often0. No joint pain or mood swings.    Physical Exam:BP 114/77 (BP Location: Left Arm, Patient Position: Sitting, Cuff Size: Normal)   Pulse 72   Ht 5\' 2"  (1.575 m)   Wt 127 lb 8 oz (57.8 kg)   BMI 23.32 kg/m  General:  Well developed, well nourished, no acute distress Skin:  Warm and dry Neck:  Midline trachea, normal thyroid, good ROM, no lymphadenopathy,no carotid bruits heard Lungs; Clear to auscultation bilaterally Breast:  No dominant palpable mass, retraction, or nipple discharge Cardiovascular: Regular rate and rhythm Abdomen:  Soft, non tender, no hepatosplenomegaly Pelvic:  External genitalia is normal in appearance, no lesions.  The vagina is pale with loss or moisture and rugae. Urethra has no lesions or masses. The cervix and uterus are absent.  No adnexal masses or tenderness noted.Bladder is non tender, no masses felt. Rectal: Good sphincter tone, no polyps, or hemorrhoids felt.  Hemoccult negative. Extremities/musculoskeletal:  No swelling,has spider veins, no clubbing or cyanosis Psych:  No mood changes, alert and cooperative,seems happy Fall risk is low PHQ 2 score is 0 Examination chaperoned by Kylie Pupa LPN  Impression and Plan: 1. Well woman exam with routine gynecological exam Physical in 1 year Mammogram  yearly Labs with PCP  2. Screening for colorectal cancer Colonoscopy per GI

## 2019-03-30 ENCOUNTER — Ambulatory Visit: Payer: 59 | Admitting: Orthopedic Surgery

## 2019-03-30 ENCOUNTER — Encounter: Payer: Self-pay | Admitting: Orthopedic Surgery

## 2019-03-30 ENCOUNTER — Ambulatory Visit: Payer: 59

## 2019-03-30 ENCOUNTER — Other Ambulatory Visit: Payer: Self-pay

## 2019-03-30 VITALS — BP 138/87 | HR 81 | Ht 61.5 in | Wt 125.0 lb

## 2019-03-30 DIAGNOSIS — M20011 Mallet finger of right finger(s): Secondary | ICD-10-CM | POA: Diagnosis not present

## 2019-03-30 DIAGNOSIS — M79644 Pain in right finger(s): Secondary | ICD-10-CM

## 2019-03-30 NOTE — Progress Notes (Signed)
ROSSELYN DELOREY  03/30/2019  Body mass index is 23.24 kg/m.   HISTORY SECTION :  Chief Complaint  Patient presents with  . Hand Pain    right ring finger injury 1 month ago    64 year old female caught her finger in a door a month ago noticed some pain at the tip of the ring finger it persisted comes in complaining of mass or not over the DIP joint of the right ring finger     Review of Systems  Constitutional: Negative for fever.  Neurological: Negative for tingling.     has a past medical history of Burning with urination (11/01/2014), Hematuria (07/28/2013), History of UTI, Hyperlipidemia, Osteopenia, PONV (postoperative nausea and vomiting), and Vaginal dryness (12/03/2012).   Past Surgical History:  Procedure Laterality Date  . ABDOMINAL HYSTERECTOMY    . BREAST BIOPSY Left   . COLONOSCOPY N/A 08/25/2016   Procedure: COLONOSCOPY;  Surgeon: Danie Binder, MD;  [facility];  Service: Endoscopy;  Laterality: N/A;  . DIAGNOSTIC LAPAROSCOPY    . KNEE ARTHROSCOPY WITH MEDIAL MENISECTOMY  01/26/2012   Procedure: KNEE ARTHROSCOPY WITH MEDIAL MENISECTOMY;  Surgeon: Carole Civil, MD;  Location: [facility];  Service: Orthopedics;  Laterality: Left;  . POLYPECTOMY  08/25/2016   Procedure: POLYPECTOMY;  Surgeon: Danie Binder, MD;  [facility];  Service: Endoscopy;;  . TONSILLECTOMY      Body mass index is 23.24 kg/m.   No Known Allergies   Current Outpatient Medications:  .  Calcium Carbonate-Vitamin D (CALCIUM 600 + D PO), Take by mouth daily. Takes 2 chewables daily, Disp: , Rfl:  .  ibuprofen (ADVIL,MOTRIN) 200 MG tablet, Take 400 mg by mouth daily as needed for headache or moderate pain., Disp: , Rfl:  .  Multiple Vitamin (MULITIVITAMIN WITH MINERALS) TABS, Take 1 tablet by mouth daily., Disp: , Rfl:  .  OVER THE COUNTER MEDICATION, Take 2 each by mouth daily. Cyruta , Disp: , Rfl:  .  OVER THE COUNTER MEDICATION, Take 4 each by mouth daily.  Cholaplex, Disp: , Rfl:  .  SUMAtriptan (IMITREX) 100 MG tablet, Take 1 tablet (100 mg total) by mouth every 2 (two) hours as needed for migraine or headache. May repeat in 2 hours if needed., Disp: 10 tablet, Rfl: 1 .  Vitamin D, Cholecalciferol, 400 units CAPS, Take 1,000 Units by mouth 2 (two) times daily. , Disp: , Rfl:    PHYSICAL EXAM SECTION: 1) BP 138/87   Pulse 81   Ht 5' 1.5" (1.562 m)   Wt 125 lb (56.7 kg)   BMI 23.24 kg/m   Body mass index is 23.24 kg/m. General appearance: Well-developed well-nourished no gross deformities  2) Cardiovascular normal pulse and perfusion , normal color   3) Neurologically deep tendon reflexes are equal and normal, no sensation loss or deficits no pathologic reflexes  4) Psychological: Awake alert and oriented x3 mood and affect normal  5) Skin no lacerations or ulcerations no nodularity no palpable masses, no erythema or nodularity  6) Musculoskeletal:   Right ring finger is extended/hyperextended at the PIP joint decreased extension and extensor lag at the DIP joint with tenderness pain and some redness there   MEDICAL DECISION MAKING  A.  Encounter Diagnoses  Name Primary?  . Pain in right finger(s) Yes  . Mallet deformity of right ring finger     B. DATA ANALYSED:  IMAGING:  X-rays were in-house she has a distal phalanx fracture  consistent with bony mallet injury it occupies about 50% of the joint but there is no subluxation of the DIP joint  Orders: Recommend splinting protocol  Outside records reviewed: None  C. MANAGEMENT  Mallet finger splinting protocol, may or may not regain full extension  No orders of the defined types were placed in this encounter.     Arther Abbott, MD  03/30/2019 9:59 AM

## 2019-03-30 NOTE — Patient Instructions (Signed)
Mallet Finger  Mallet finger is an injury that occurs when an object hits the tip of your straightened finger or thumb. It is also known as baseball finger. The blow to your fingertip causes it to bend more than normal, which tears the cord that attaches to the tip of your finger (extensor tendon).  Your extensor tendon is what straightens the end of your finger. If this tendon is damaged, you will not be able to straighten your fingertip. Sometimes, a piece of bone may be pulled away with the tendon (avulsion injury), or the tendon may tear completely. In some cases, surgery may be required to repair the damage. What are the causes? Mallet finger is caused by a hard, direct hit to the tip of your finger or thumb. This injury often happens from getting hit in the finger with a hard ball, such as a baseball. What increases the risk? This injury is more likely to happen if you play a sport that uses a hard ball. What are the signs or symptoms? The main symptom of this injury is the inability to straighten the tip of your finger. You can manually straighten your fingertip with your other hand, but the finger cannot straighten on its own. Other symptoms may include:  Pain.  Swelling.  Bruising.  Blood under the fingernail. How is this diagnosed? Your health care provider may suspect mallet finger if you are not able to extend your fingertip, especially if you recently injured your hand. Your health care provider will do a physical exam. This may include X-rays to see if a piece of bone has been pulled away or if the finger joint has separated (dislocated). How is this treated? Mallet finger may be treated with:  A splint on your fingertip to keep it straight (extended) while the tendon heals.  Surgery to repair the tendon. This is done in severe cases. This may involve: ? Using a pin or screw to keep your finger extended and your tendon attached. ? Using a piece of tendon from another part of  your body (graft) to replace a torn tendon. Follow these instructions at home: If you have a splint:  Wear the splint as told by your health care provider. Remove it only as told by your health care provider.  Loosen the splint if your fingers tingle, become numb, or turn cold and blue.  Keep the splint clean.  If the splint is not waterproof: ? Do not let it get wet. ? Cover it with a watertight covering when you take a bath or a shower.  If you take your splint off to dry it or change it: ? Gently press your finger on a flat surface to keep it straight. Failing to do so may lead to a permanent injury, or force you to wear the splint for a longer period of time. ? Check the skin under the splint. Tell your health care provider if you notice a blister or red and raw skin. Managing pain, stiffness, and swelling   If directed, put ice on the injured area: ? If you have a removable splint, remove it as told by your health care provider. ? Put ice in a plastic bag. ? Place a towel between your skin and the bag. ? Leave the ice on for 20 minutes, 2-3 times a day.  Move your fingers often to avoid stiffness and to lessen swelling.  Raise (elevate)the injured hand above the level of your heart while you are sitting or  lying down. General instructions  Take over-the-counter and prescription medicines only as told by your health care provider.  Do not drive or use heavy machinery while taking prescription pain medicine.  Keep all follow-up visits as told by your health care provider. This is important. Contact a health care provider if:  You have pain or swelling that is getting worse.  Your finger feels cold.  You cannot extend your finger after treatment.  You notice that the skin under the splint is red, raw, or has a blister. Get help right away if:  Even after loosening your splint, your finger is: ? Very red and swollen. ? White or blue. ? Numb or  tingling. Summary  Mallet finger is an injury that occurs from a hard, direct hit to the tip of your finger or thumb.  The blow to your fingertip causes it to bend more than normal, tearing the tendon that straightens the end of your finger. You cannot straighten your fingertip if this tendon is torn.  This injury often happens from getting hit in the finger with a hard ball, such as a baseball.  Treatment will depend on how severe the injury is. You may need to wear a splint to keep the finger straight while it heals. A more severe injury may require surgery to repair the tendon. This information is not intended to replace advice given to you by your health care provider. Make sure you discuss any questions you have with your health care provider. Document Revised: 01/26/2017 Document Reviewed: 01/26/2017 Elsevier Patient Education  2020 [employer]  

## 2019-04-01 ENCOUNTER — Ambulatory Visit (HOSPITAL_COMMUNITY): Payer: 59 | Attending: Orthopedic Surgery | Admitting: Specialist

## 2019-04-01 ENCOUNTER — Encounter (HOSPITAL_COMMUNITY): Payer: Self-pay | Admitting: Specialist

## 2019-04-01 ENCOUNTER — Ambulatory Visit
Admission: RE | Admit: 2019-04-01 | Discharge: 2019-04-01 | Disposition: A | Discharge status: Home / Self Care | Payer: 59 | Source: Ambulatory Visit | Attending: Adult Health | Admitting: Adult Health

## 2019-04-01 ENCOUNTER — Other Ambulatory Visit: Payer: Self-pay

## 2019-04-01 DIAGNOSIS — R600 Localized edema: Secondary | ICD-10-CM | POA: Insufficient documentation

## 2019-04-01 DIAGNOSIS — Z1231 Encounter for screening mammogram for malignant neoplasm of breast: Secondary | ICD-10-CM | POA: Diagnosis not present

## 2019-04-01 DIAGNOSIS — R29898 Other symptoms and signs involving the musculoskeletal system: Secondary | ICD-10-CM | POA: Diagnosis not present

## 2019-04-01 DIAGNOSIS — M25641 Stiffness of right hand, not elsewhere classified: Secondary | ICD-10-CM | POA: Diagnosis not present

## 2019-04-01 NOTE — Therapy (Signed)
[place], [place], [address] Phone: [phone]   Fax:  [phone]  Occupational Therapy Evaluation  Patient Details  Name: Kylie Duffy MRN: [id number] Date of Birth: March 21, 1955 Referring Provider (OT): Dr. Arther Abbott   Encounter Date: 04/01/2019  OT End of Session - 04/01/19 1705    Visit Number  1    Number of Visits  4    Date for OT Re-Evaluation  06/24/19    Authorization Type  [facility]    Authorization - Visit Number  1    Authorization - Number of Visits  25    Progress Note Due on Visit  10    OT Start Time  1345    OT Stop Time  1430    OT Time Calculation (min)  45 min    Activity Tolerance  Patient tolerated treatment well    Behavior During Therapy  [facility] for tasks assessed/performed       Past Medical History:  Diagnosis Date  . Burning with urination 11/01/2014  . Hematuria 07/28/2013  . History of UTI   . Hyperlipidemia   . Osteopenia   . PONV (postoperative nausea and vomiting)   . Vaginal dryness 12/03/2012    Past Surgical History:  Procedure Laterality Date  . ABDOMINAL HYSTERECTOMY    . BREAST BIOPSY Left   . COLONOSCOPY N/A 08/25/2016   Procedure: COLONOSCOPY;  Surgeon: Danie Binder, MD;  [facility];  Service: Endoscopy;  Laterality: N/A;  . DIAGNOSTIC LAPAROSCOPY    . KNEE ARTHROSCOPY WITH MEDIAL MENISECTOMY  01/26/2012   Procedure: KNEE ARTHROSCOPY WITH MEDIAL MENISECTOMY;  Surgeon: Carole Civil, MD;  Location: [facility];  Service: Orthopedics;  Laterality: Left;  . POLYPECTOMY  08/25/2016   Procedure: POLYPECTOMY;  Surgeon: Danie Binder, MD;  [facility];  Service: Endoscopy;;  . TONSILLECTOMY      There were no vitals filed for this visit.  Subjective Assessment - 04/01/19 1659    Subjective   S:  I gently closed my finger in the car door about a month ago.  Its hurt ever since.  It didnt dawn on me that it could be a fracture.    Pertinent History  [name] reports closing her right ring finger in her car door about a month ago.  it has been painful and swollen since that time, and has developed a nodule on the dorsal surface.  She consulted with Dr. Aline Brochure and an xray detected a distal phalanx fracture at the DIPJ with tendon injury.  [name] has been referred to occupational therapy for evaluation and treatment.    Special Tests  A/ROM assessment    Patient Stated Goals  regain full movement and avoid surgery.    Currently in Pain?  Yes    Pain Score  5     Pain Location  Finger (Comment which one)   right ring   Pain Orientation  Right    Pain Descriptors / Indicators  Aching    Pain Type  Acute pain    Pain Radiating Towards  hand    Pain Onset  1 to 4 weeks ago    Pain Frequency  Constant    Aggravating Factors   movement    Pain Relieving Factors  nothing    Effect of Pain on Daily Activities  moderate        [facility] OT Assessment - 04/01/19 0001  Assessment   Medical Diagnosis  Right Ring Finger Mallet Injury    Referring Provider (OT)  Dr. Arther Abbott    Onset Date/Surgical Date  03/04/19    Hand Dominance  Right    Next MD Visit  6 weeks    Prior Therapy  n/a      Precautions   Precautions  --   no active flexion of ring finger     Restrictions   Weight Bearing Restrictions  No      Balance Screen   Has the patient fallen in the past 6 months  No    Has the patient had a decrease in activity level because of a fear of falling?   No    Is the patient reluctant to leave their home because of a fear of falling?   No      Home  Environment   Family/patient expects to be discharged to:  Private residence    Lives With  Spouse      Prior Function   Level of Independence  Independent      ADL   ADL comments  patient is not able to use her right ring finger to grasp and squeeze items, difficulty typing due to extensor lag in dipj      Written Expression   Dominant Hand  Right       Vision - History   Baseline Vision  Wears glasses all the time      Cognition   Overall Cognitive Status  Within Functional Limits for tasks assessed      Observation/Other Assessments   Observations  arom assessment      Sensation   Light Touch  Appears Intact      Coordination   Gross Motor Movements are Fluid and Coordinated  Yes    Fine Motor Movements are Fluid and Coordinated  No    9 Hole Peg Test  --   to be completed     Edema   Edema  ring finger DIPJ:  right:  5.0 cm  left:  4.3 cm       Right Hand AROM extension   R Ring DIP 0-70  --   extensor lag of 20 degrees      Right Hand PROM extension   R Ring DIP 0-70  0 Degrees      Left Hand AROM extension   L Ring DIP 0-70  0 Degrees      Left Hand PROM extension    L Ring DIP 0-70  0 Degrees               OT Treatments/Exercises (OP) - 04/01/19 0001      Splinting   Splinting  OT fabricated dorsal based gutter splint, positioning DIPJ in full extension, securing with 2 straps distal and proximal to DIPJ.  Splint allows for PIPJ flexion and extension.  2 splints fabricated so that 1 can be worn in shower.  Educated to wear splint at all times, removing for hygiene only.  Educated patient to place right ring fingertip on edge of table with hand below table when donning and doffing splint in order to maintain full extension.  extension should be worn for a minimum of 8 weeks.             OT Education - 04/01/19 1704    Education Details  educated patient on wear and care schedule for dorsal mallet splint.    Person(s) Educated  Patient  Methods  Explanation;Demonstration;Handout    Comprehension  Verbalized understanding;Returned demonstration       OT Short Term Goals - 04/01/19 1716      OT SHORT TERM GOAL #1   Title  Patient will be educated and independent with use of mallet splint.    Time  1    Period  Weeks    Status  New    Target Date  04/08/19        OT Long Term  Goals - 04/01/19 1716      OT LONG TERM GOAL #1   Title  Patient will return to full use of RUE as dominant with all functional activities.    Time  12    Period  Weeks    Status  New    Target Date  06/24/19      OT LONG TERM GOAL #2   Title  Patient will have full flexion and extension in DIPJ of right ring finger needed for full grip on functional items.    Time  12    Period  Weeks    Status  New      OT LONG TERM GOAL #3   Title  Patient will have WNL grip strength in her right hand needed to open containers.    Time  12    Period  Weeks    Status  New      OT LONG TERM GOAL #4   Title  Patient will have trace edema in her right ring finger for improved pain control.    Time  12    Period  Weeks    Status  New      OT LONG TERM GOAL #5   Title  Patient will have WNL fine motor coordination for in hand manipulation tasks such as picking up change and for typing at work.    Time  12    Period  Weeks    Status  New            Plan - 04/01/19 1706    Clinical Impression Statement  A:  Cheryllynn is a 64 year old female that presents with right ring finger mallet injury.  Patient is right hand dominant. She is unable to use her ring finger as she is completing gripping or squeezing activities.  She is unable to fully extend her DIPJ, which makes typing difficut.  Patient is employed at [employer] as a Research scientist (physical sciences).  Patient will benefit from skilled OT intervention to decrease extensor lag in her right ringer finger and improve her pain free mobilty in her right hand. Splint fabricated this date to hold DIPJ in full extension/slight hyperextension. Patient to wear at all times, removing for hygiene purposes only for 6 weeks minimum.    OT Occupational Profile and History  Problem Focused Assessment - Including review of records relating to presenting problem    Occupational performance deficits (Please refer to evaluation for details):  ADL's;IADL's;Work    Body  Structure / Function / Physical Skills  ADL;Strength;Pain;Dexterity;Edema;IADL;ROM;FMC    Rehab Potential  Good    Clinical Decision Making  Limited treatment options, no task modification necessary    Comorbidities Affecting Occupational Performance:  None    Modification or Assistance to Complete Evaluation   No modification of tasks or assist necessary to complete eval    OT Frequency  1x / week   for splint fabrication, patient to wear splint for 6 weeks and we will  then reassess for progressing therapy.   OT Duration  12 weeks    OT Treatment/Interventions  Self-care/ADL training;Splinting;Therapeutic activities;Therapeutic exercise;Cryotherapy;Manual Therapy;Patient/family education    Plan  P:  Skilled OT intervention to decrease pain and edema and increase A/ROM and strength in right dominant hand.  Therapy to be placed on hold for 6 weeks while patient wears mallet splint full time, will reassess in 6 weeks for progression of therapy.    OT Home Exercise Plan  splint wear and care    Consulted and Agree with Plan of Care  Patient       Patient will benefit from skilled therapeutic intervention in order to improve the following deficits and impairments:   Body Structure / Function / Physical Skills: ADL, Strength, Pain, Dexterity, Edema, IADL, ROM, FMC       Visit Diagnosis: Stiffness of right hand, not elsewhere classified - Plan: Ot plan of care cert/re-cert  Other symptoms and signs involving the musculoskeletal system - Plan: Ot plan of care cert/re-cert  Localized edema - Plan: Ot plan of care cert/re-cert    Problem List Patient Active Problem List   Diagnosis Date Noted  . Well woman exam with routine gynecological exam 02/11/2018  . Screening for colorectal cancer 02/11/2018  . Pituitary abnormality ([place]) 12/26/2016  . Hyperlipidemia 06/30/2016  . Osteopenia 01/04/2014  . Migraines 01/04/2014  . Medial meniscus, posterior horn derangement 01/29/2012  .  Chondromalacia of patella, left 01/29/2012    Vangie Bicker, [place], OTR/L [phone]  04/01/2019, 5:32 PM  [facility] [address] [place], [place], [address] Phone: [phone]   Fax:  [phone]  Name: Kylie Duffy MRN: [id number] Date of Birth: [birth year]

## 2019-04-01 NOTE — Patient Instructions (Signed)
Your Splint This splint should initially be fitted by a healthcare practitioner.  The healthcare practitioner is responsible for providing wearing instructions and precautions to the patient, other healthcare practitioners and care provider involved in the patient's care.  This splint was custom made for you. Please read the following instructions to learn about wearing and caring for your splint.  Precautions Should your splint cause any of the following problems, remove the splint immediately and contact your therapist/physician.  Swelling  Severe Pain  Pressure Areas  Stiffness  Numbness  Do not wear your splint while operating machinery unless it has been fabricated for that purpose.  When To Wear Your Splint Where your splint according to your therapist/physician instructions. All the time.  When removing for hygiene and replacing, rest finger tip on edge of the table.  Splint should cover your finger tip to just above your PIPJ (middle knuckle).  It is ok to bend your middle knuckle.   Care and Cleaning of Your Splint 1. Keep your splint away from open flames. 2. Your splint will lose its shape in temperatures over 135 degrees Farenheit, ( in car windows, near radiators, ovens or in hot water).  Never make any adjustments to your splint, if the splint needs adjusting remove it and make an appointment to see your therapist. 3. Your splint, including the cushion liner may be cleaned with soap and lukewarm water.  Do not immerse in hot water over 135 degrees Farenheit. 4. Straps may be washed with soap and water, but do not moisten the self-adhesive portion. 5. For ink or hard to remove spots use a scouring cleanser which contains chlorine.  Rinse the splint thoroughly after using chlorine cleanser.   [address], [place], OTR/L [phone]

## 2019-04-05 ENCOUNTER — Other Ambulatory Visit: Payer: Self-pay

## 2019-04-05 ENCOUNTER — Ambulatory Visit (HOSPITAL_COMMUNITY): Payer: 59 | Admitting: Specialist

## 2019-04-05 ENCOUNTER — Encounter (HOSPITAL_COMMUNITY): Payer: Self-pay | Admitting: Specialist

## 2019-04-05 DIAGNOSIS — M25641 Stiffness of right hand, not elsewhere classified: Secondary | ICD-10-CM | POA: Diagnosis not present

## 2019-04-05 DIAGNOSIS — R600 Localized edema: Secondary | ICD-10-CM | POA: Diagnosis not present

## 2019-04-05 DIAGNOSIS — R29898 Other symptoms and signs involving the musculoskeletal system: Secondary | ICD-10-CM

## 2019-04-05 NOTE — Therapy (Signed)
[place], [place], [address] Phone: [phone]   Fax:  [phone]  Occupational Therapy Treatment  Patient Details  Name: Kylie Duffy MRN: [id number] Date of Birth: 1955-08-19 Referring Provider (OT): Dr. Arther Abbott   Encounter Date: 04/05/2019  OT End of Session - 04/05/19 1640    Visit Number  2    Number of Visits  4    Date for OT Re-Evaluation  06/24/19    Authorization Type  UMR    Authorization - Visit Number  2    Authorization - Number of Visits  25    Progress Note Due on Visit  10    OT Start Time  1605    OT Stop Time  1625    OT Time Calculation (min)  20 min    Activity Tolerance  Patient tolerated treatment well    Behavior During Therapy  [facility] for tasks assessed/performed       Past Medical History:  Diagnosis Date  . Burning with urination 11/01/2014  . Hematuria 07/28/2013  . History of UTI   . Hyperlipidemia   . Osteopenia   . PONV (postoperative nausea and vomiting)   . Vaginal dryness 12/03/2012    Past Surgical History:  Procedure Laterality Date  . ABDOMINAL HYSTERECTOMY    . BREAST BIOPSY Left   . COLONOSCOPY N/A 08/25/2016   Procedure: COLONOSCOPY;  Surgeon: Danie Binder, MD;  [facility];  Service: Endoscopy;  Laterality: N/A;  . DIAGNOSTIC LAPAROSCOPY    . KNEE ARTHROSCOPY WITH MEDIAL MENISECTOMY  01/26/2012   Procedure: KNEE ARTHROSCOPY WITH MEDIAL MENISECTOMY;  Surgeon: Carole Civil, MD;  Location: [facility];  Service: Orthopedics;  Laterality: Left;  . POLYPECTOMY  08/25/2016   Procedure: POLYPECTOMY;  Surgeon: Danie Binder, MD;  [facility]: [facility];  Service: Endoscopy;;  . TONSILLECTOMY      There were no vitals filed for this visit.  Subjective Assessment - 04/05/19 1639    Subjective   S: I think one of these is too loose and it is irritating my skin a bit.         [name] OT Assessment - 04/05/19 0001      Assessment   Medical  Diagnosis  Right Ring Finger Mallet Injury    Referring Provider (OT)  Dr. Arther Abbott               OT Treatments/Exercises (OP) - 04/05/19 0001      Splinting   Splinting  refit splints for improved fit and comfort and also covered splint in moleskin for improved comfort for patient.  reviewed safe donning and doffing principles with client.              OT Education - 04/05/19 1640    Education Details  reviewed donning and doffing of splint with hand positioned below finger on edge of table to maintain full extension.    Person(s) Educated  Patient    Methods  Explanation;Demonstration;Handout    Comprehension  Verbalized understanding;Returned demonstration       OT Short Term Goals - 04/05/19 1643      OT SHORT TERM GOAL #1   Title  Patient will be educated and independent with use of mallet splint.    Time  1    Period  Weeks    Status  Achieved    Target Date  04/08/19  OT Long Term Goals - 04/05/19 1643      OT LONG TERM GOAL #1   Title  Patient will return to full use of RUE as dominant with all functional activities.    Time  12    Period  Weeks    Status  New      OT LONG TERM GOAL #2   Title  Patient will have full flexion and extension in DIPJ of right ring finger needed for full grip on functional items.    Time  12    Period  Weeks    Status  New      OT LONG TERM GOAL #3   Title  Patient will have WNL grip strength in her right hand needed to open containers.    Time  12    Period  Weeks    Status  Achieved      OT LONG TERM GOAL #4   Title  Patient will have trace edema in her right ring finger for improved pain control.    Time  12    Period  Weeks    Status  Achieved      OT LONG TERM GOAL #5   Title  Patient will have WNL fine motor coordination for in hand manipulation tasks such as picking up change and for typing at work.    Time  12    Period  Weeks    Status  Achieved            Plan - 04/05/19  1641    Clinical Impression Statement  A:  PRN visit this date to Refit one of two splints for greater fit and comfort.  Added lambs skin lining to both splints for less skin irritation.  Reviewed donning and doffing of splint and recommended positioning hand below finger on table edge for maintaining full extension of DIPJ during donning and doffing.    Plan  P:  Skilled OT intervention to decrease pain and edema and increase A/ROM and strength in right dominant hand.  Therapy to be placed on hold for 6 weeks while patient wears mallet splint full time, will reassess in 6 weeks for progression of therapy.       Patient will benefit from skilled therapeutic intervention in order to improve the following deficits and impairments:           Visit Diagnosis: Stiffness of right hand, not elsewhere classified  Other symptoms and signs involving the musculoskeletal system  Localized edema    Problem List Patient Active Problem List   Diagnosis Date Noted  . Well woman exam with routine gynecological exam 02/11/2018  . Screening for colorectal cancer 02/11/2018  . Pituitary abnormality ([place]) 12/26/2016  . Hyperlipidemia 06/30/2016  . Osteopenia 01/04/2014  . Migraines 01/04/2014  . Medial meniscus, posterior horn derangement 01/29/2012  . Chondromalacia of patella, left 01/29/2012    Vangie Bicker, [place], OTR/L [phone]  04/05/2019, 5:04 PM  [place], [place], [address] Phone: [phone]   Fax:  [phone]  Name: Kylie Duffy MRN: [id number] Date of Birth: 1955/09/25

## 2019-05-13 ENCOUNTER — Encounter: Payer: Self-pay | Admitting: Orthopedic Surgery

## 2019-05-13 ENCOUNTER — Other Ambulatory Visit: Payer: Self-pay

## 2019-05-13 ENCOUNTER — Ambulatory Visit: Payer: 59 | Admitting: Orthopedic Surgery

## 2019-05-13 VITALS — BP 124/76 | HR 70 | Ht 61.5 in | Wt 125.0 lb

## 2019-05-13 DIAGNOSIS — M20011 Mallet finger of right finger(s): Secondary | ICD-10-CM | POA: Diagnosis not present

## 2019-05-13 NOTE — Progress Notes (Signed)
Chief Complaint  Patient presents with  . Hand Pain    right ring finger injury beginning of Feb     Encounter Diagnosis  Name Primary?  Kylie Duffy deformity of right ring finger Yes    64 year old female status post mallet type distal phalanx injury right ring finger back in February I saw her in March 3 started a splinting program which she has been compliant with.  She has good maintenance of extension against gravity with the splint removed  There is no skin irritation from the splint  Recommend continue splinting but we can start the nighttime weaning program follow-up in 4 to 6 weeks

## 2019-05-13 NOTE — Addendum Note (Signed)
Addended byCandice Camp on: 05/13/2019 10:42 AM   Modules accepted: Orders

## 2019-05-13 NOTE — Patient Instructions (Signed)
Ok to transition to weaning and hs splinting

## 2019-05-18 MED FILL — SUMAtriptan SUCCINATE 100 M: 100 | 30 days supply | Qty: 9 | Fill #1

## 2019-05-19 ENCOUNTER — Encounter (HOSPITAL_COMMUNITY): Payer: Self-pay

## 2019-05-19 ENCOUNTER — Other Ambulatory Visit: Payer: Self-pay

## 2019-05-19 ENCOUNTER — Ambulatory Visit (HOSPITAL_COMMUNITY): Payer: 59 | Attending: Orthopedic Surgery

## 2019-05-19 DIAGNOSIS — R29898 Other symptoms and signs involving the musculoskeletal system: Secondary | ICD-10-CM | POA: Insufficient documentation

## 2019-05-19 DIAGNOSIS — R600 Localized edema: Secondary | ICD-10-CM | POA: Diagnosis not present

## 2019-05-19 DIAGNOSIS — M25641 Stiffness of right hand, not elsewhere classified: Secondary | ICD-10-CM | POA: Insufficient documentation

## 2019-05-19 NOTE — Therapy (Signed)
[place], [place], [address] Phone: [phone]   Fax:  [phone]  Occupational Therapy Treatment  Patient Details  Name: Kylie Duffy MRN: [id number] Date of Birth: 06/09/1955 Referring Provider (OT): Dr. Arther Abbott   Encounter Date: 05/19/2019  OT End of Session - 05/19/19 1643    Visit Number  3    Number of Visits  4    Authorization Type  UMR    Authorization - Visit Number  3    Authorization - Number of Visits  25    Progress Note Due on Visit  10    OT Start Time  1605    OT Stop Time  1637    OT Time Calculation (min)  32 min    Activity Tolerance  Patient tolerated treatment well    Behavior During Therapy  [facility] for tasks assessed/performed       Past Medical History:  Diagnosis Date  . Burning with urination 11/01/2014  . Hematuria 07/28/2013  . History of UTI   . Hyperlipidemia   . Osteopenia   . PONV (postoperative nausea and vomiting)   . Vaginal dryness 12/03/2012    Past Surgical History:  Procedure Laterality Date  . ABDOMINAL HYSTERECTOMY    . BREAST BIOPSY Left   . COLONOSCOPY N/A 08/25/2016   Procedure: COLONOSCOPY;  Surgeon: Danie Binder, MD;  [facility];  Service: Endoscopy;  Laterality: N/A;  . DIAGNOSTIC LAPAROSCOPY    . KNEE ARTHROSCOPY WITH MEDIAL MENISECTOMY  01/26/2012   Procedure: KNEE ARTHROSCOPY WITH MEDIAL MENISECTOMY;  Surgeon: Carole Civil, MD;  Location: [facility];  Service: Orthopedics;  Laterality: Left;  . POLYPECTOMY  08/25/2016   Procedure: POLYPECTOMY;  Surgeon: Danie Binder, MD;  [facility];  Service: Endoscopy;;  . TONSILLECTOMY      There were no vitals filed for this visit.  Subjective Assessment - 05/19/19 1641    Subjective   S: I've been so nervous about keeping it straight. I don't want to mess anything up.    Currently in Pain?  Yes    Pain Score  1     Pain Location  Finger (Comment which one)   ring  finger DIP joint.   Pain Orientation  Right    Pain Descriptors / Indicators  Aching;Sore    Pain Type  Acute pain    Pain Radiating Towards  n/a    Pain Onset  More than a month ago    Pain Frequency  Intermittent    Aggravating Factors   movement    Pain Relieving Factors  none stated    Effect of Pain on Daily Activities  min to moderate effect    Multiple Pain Sites  No         [facility] OT Assessment - 05/19/19 1730      Assessment   Medical Diagnosis  Right Ring Finger Mallet Injury      Precautions   Precautions  None;Other (comment)    Precaution Comments  follow conservative management mallet deformity.                       OT Education - 05/19/19 1640    Education Details  discussed conservative management for week 7, week 8, and week 9 of protocol. Provided A/ROM hand finger flexion, joint blocking to DIP, no passive stretching to DIP joint. yellow theraputty to begin at week  8.    Person(s) Educated  Patient    Methods  Explanation;Demonstration;Handout;Verbal cues    Comprehension  Returned demonstration;Verbalized understanding       OT Short Term Goals - 05/19/19 1643      OT SHORT TERM GOAL #1   Title  Patient will be educated and independent with use of mallet splint.    Time  1    Period  Weeks    Target Date  04/08/19        OT Long Term Goals - 05/19/19 1643      OT LONG TERM GOAL #1   Title  Patient will return to full use of RUE as dominant with all functional activities.    Time  12    Period  Weeks    Status  Not Met      OT LONG TERM GOAL #2   Title  Patient will have full flexion and extension in DIPJ of right ring finger needed for full grip on functional items.    Time  12    Period  Weeks    Status  Achieved      OT LONG TERM GOAL #3   Title  Patient will have WNL grip strength in her right hand needed to open containers.    Time  12    Period  Weeks      OT LONG TERM GOAL #4   Title  Patient will have trace  edema in her right ring finger for improved pain control.    Time  12    Period  Weeks      OT LONG TERM GOAL #5   Title  Patient will have WNL fine motor coordination for in hand manipulation tasks such as picking up change and for typing at work.    Time  12    Period  Weeks            Plan - 05/19/19 1644    Clinical Impression Statement  A: Reassessed patient this date while providing her with education for weekly progression of splint weaning and HEP to compliment. Reviewed precautions and exercises with patient verbalizing understanding. patient is able to complete HEP and continue conservative management protocol independently.    Body Structure / Function / Physical Skills  ADL;Strength;Pain;Dexterity;Edema;IADL;ROM;FMC    Plan  P: D/C from OT services while following conserative management protocol for mallet finger with HEP.    OT Home Exercise Plan  splint wear and care. 4/22: yellow theraputty, joint blocking DIP joint, digit flexion A/ROM, no passive stretching.    Consulted and Agree with Plan of Care  Patient       Patient will benefit from skilled therapeutic intervention in order to improve the following deficits and impairments:   Body Structure / Function / Physical Skills: ADL, Strength, Pain, Dexterity, Edema, IADL, ROM, FMC       Visit Diagnosis: Localized edema  Other symptoms and signs involving the musculoskeletal system  Stiffness of right hand, not elsewhere classified    Problem List Patient Active Problem List   Diagnosis Date Noted  . Well woman exam with routine gynecological exam 02/11/2018  . Screening for colorectal cancer 02/11/2018  . Pituitary abnormality ([place]) 12/26/2016  . Hyperlipidemia 06/30/2016  . Osteopenia 01/04/2014  . Migraines 01/04/2014  . Medial meniscus, posterior horn derangement 01/29/2012  . Chondromalacia of patella, left 01/29/2012    OCCUPATIONAL THERAPY DISCHARGE SUMMARY  Visits from Start of Care:  3  Current  functional level related to goals / functional outcomes: See above   Remaining deficits: Mild pain, mild stiffness, general weakness from non-use while in splint.   Education / Equipment: HEP and conservative management protocol. Plan: Patient agrees to discharge.  Patient goals were met. Patient is being discharged due to meeting the stated rehab goals.  ?????          Ailene Ravel, OTR/L,CBIS  [phone]  05/19/2019, 5:30 PM  [place], [place], [address] Phone: [phone]   Fax:  [phone]  Name: Kylie Duffy MRN: [id number] Date of Birth: December 12, 1955

## 2019-05-19 NOTE — Patient Instructions (Addendum)
AROM: DIP Flexion / Extension   Pinch middle knuckle of ____ring____ finger of right hand to prevent bending. Bend end knuckle until stretch is felt. Hold __3-5__ seconds. Relax. Straighten finger as far as possible. Repeat __10-15__ times per set. Do ___1-2_ sets per session. Do _2-3___ sessions per day.       AROM: Finger Flexion / Extension   With splint on your finger: Actively bend fingers of right hand. Start with knuckles furthest from palm, and slowly make a fist. Hold _3-5___ seconds. Relax. Then straighten fingers as far as possible. Repeat __10-15__ times per set. Do __1-2__ sets per session. Do __2-3__ sessions per day.     Week 7 (05/19/19)  Reduce your splint wearing to 4 one hour session during the day. Continue to wear at night.   Week 8 (05/27/19) Discontinue wearing the splint during the day. Only wear at night.   Start the putty exercises below: Home Exercises Program Theraputty Exercises  Do the following exercises 1-2 times a day using your affected hand.   1: Roll putty in a ball. Squeeze and release gently 10-15 times.  2: Gently pinch putty with all 4 fingers together 10-15 times.   Week 9 (06/03/19) Discontinue wearing splint.

## 2019-06-22 ENCOUNTER — Other Ambulatory Visit: Payer: Self-pay

## 2019-06-22 ENCOUNTER — Ambulatory Visit: Payer: 59 | Admitting: Orthopedic Surgery

## 2019-06-22 VITALS — Ht 61.5 in | Wt 125.0 lb

## 2019-06-22 DIAGNOSIS — M20011 Mallet finger of right finger(s): Secondary | ICD-10-CM | POA: Diagnosis not present

## 2019-06-22 NOTE — Progress Notes (Signed)
Chief Complaint  Patient presents with  . 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  Mild extensor lag.  Full flexion.  The extensor lag is very minimal should not cause any problems patient is released to follow-up as needed  Encounter Diagnosis  Name Primary?  . Mallet deformity of right ring finger Yes

## 2019-06-24 ENCOUNTER — Ambulatory Visit: Payer: 59 | Admitting: Orthopedic Surgery

## 2019-07-08 ENCOUNTER — Encounter: Payer: Self-pay | Admitting: Family Medicine

## 2019-07-11 ENCOUNTER — Other Ambulatory Visit: Payer: Self-pay | Admitting: *Deleted

## 2019-07-11 DIAGNOSIS — E785 Hyperlipidemia, unspecified: Secondary | ICD-10-CM

## 2019-07-13 ENCOUNTER — Other Ambulatory Visit: Payer: 59

## 2019-07-13 ENCOUNTER — Other Ambulatory Visit: Payer: Self-pay

## 2019-07-13 DIAGNOSIS — E785 Hyperlipidemia, unspecified: Secondary | ICD-10-CM | POA: Diagnosis not present

## 2019-07-13 LAB — COMPLETE METABOLIC PANEL WITH GFR
AG Ratio: 1.8 (calc) (ref 1.0–2.5)
ALT: 16 U/L (ref 6–29)
AST: 19 U/L (ref 10–35)
Albumin: 4.4 g/dL (ref 3.6–5.1)
Alkaline phosphatase (APISO): 60 U/L (ref 37–153)
BUN: 17 mg/dL (ref 7–25)
CO2: 26 mmol/L (ref 20–32)
Calcium: 9.2 mg/dL (ref 8.6–10.4)
Chloride: 103 mmol/L (ref 98–110)
Creat: 0.75 mg/dL (ref 0.50–0.99)
GFR, Est African American: 98 mL/min/{1.73_m2} (ref >=60)
GFR, Est Non African American: 85 mL/min/{1.73_m2} (ref >=60)
Globulin: 2.4 g/dL (calc) (ref 1.9–3.7)
Glucose, Bld: 80 mg/dL (ref 65–99)
Potassium: 4.5 mmol/L (ref 3.5–5.3)
Sodium: 139 mmol/L (ref 135–146)
Total Bilirubin: 0.5 mg/dL (ref 0.2–1.2)
Total Protein: 6.8 g/dL (ref 6.1–8.1)

## 2019-07-13 LAB — CBC WITH DIFFERENTIAL/PLATELET
Absolute Monocytes: 383 cells/uL (ref 200–950)
Basophils Absolute: 57 cells/uL (ref 0–200)
Basophils Relative: 0.8 %
Eosinophils Absolute: 178 cells/uL (ref 15–500)
Eosinophils Relative: 2.5 %
HCT: 42.1 % (ref 35.0–45.0)
Hemoglobin: 13.7 g/dL (ref 11.7–15.5)
Lymphs Abs: 1910 cells/uL (ref 850–3900)
MCH: 29.7 pg (ref 27.0–33.0)
MCHC: 32.5 g/dL (ref 32.0–36.0)
MCV: 91.1 fL (ref 80.0–100.0)
MPV: 10.5 fL (ref 7.5–12.5)
Monocytes Relative: 5.4 %
Neutro Abs: 4572 cells/uL (ref 1500–7800)
Neutrophils Relative %: 64.4 %
Platelets: 502 10*3/uL — ABNORMAL HIGH (ref 140–400)
RBC: 4.62 10*6/uL (ref 3.80–5.10)
RDW: 13.4 % (ref 11.0–15.0)
Total Lymphocyte: 26.9 %
WBC: 7.1 10*3/uL (ref 3.8–10.8)

## 2019-07-13 LAB — LIPID PANEL
Cholesterol: 228 mg/dL — ABNORMAL HIGH (ref <200)
HDL: 56 mg/dL (ref >=50)
LDL Cholesterol (Calc): 150 mg/dL (calc) — ABNORMAL HIGH
Non-HDL Cholesterol (Calc): 172 mg/dL (calc) — ABNORMAL HIGH (ref <130)
Total CHOL/HDL Ratio: 4.1 (calc) (ref <5.0)
Triglycerides: 105 mg/dL (ref <150)

## 2019-07-19 ENCOUNTER — Other Ambulatory Visit: Payer: Self-pay

## 2019-07-19 ENCOUNTER — Encounter: Payer: Self-pay | Admitting: Family Medicine

## 2019-07-19 ENCOUNTER — Ambulatory Visit (INDEPENDENT_AMBULATORY_CARE_PROVIDER_SITE_OTHER): Payer: 59 | Admitting: Family Medicine

## 2019-07-19 VITALS — BP 102/64 | HR 88 | Temp 98.1°F | Resp 14 | Ht 61.5 in | Wt 123.0 lb

## 2019-07-19 DIAGNOSIS — D473 Essential (hemorrhagic) thrombocythemia: Secondary | ICD-10-CM | POA: Diagnosis not present

## 2019-07-19 DIAGNOSIS — E785 Hyperlipidemia, unspecified: Secondary | ICD-10-CM

## 2019-07-19 DIAGNOSIS — G43109 Migraine with aura, not intractable, without status migrainosus: Secondary | ICD-10-CM | POA: Diagnosis not present

## 2019-07-19 DIAGNOSIS — Z0001 Encounter for general adult medical examination with abnormal findings: Secondary | ICD-10-CM | POA: Diagnosis not present

## 2019-07-19 DIAGNOSIS — D75839 Thrombocytosis, unspecified: Secondary | ICD-10-CM

## 2019-07-19 DIAGNOSIS — Z0000 Encounter for general adult medical examination without abnormal findings: Secondary | ICD-10-CM

## 2019-07-19 DIAGNOSIS — M8589 Other specified disorders of bone density and structure, multiple sites: Secondary | ICD-10-CM | POA: Diagnosis not present

## 2019-07-19 NOTE — Patient Instructions (Signed)
F/U 3 months for lab visit F/U 1 year for physical

## 2019-07-19 NOTE — Progress Notes (Signed)
   Subjective:    Patient ID: Kylie Duffy, female    DOB: [birth year], 64 y.o.   MRN: [id number]  Patient presents for Annual Exam (has had labs)  Pt here for CPE, meds and history reviewed  Recent fasting labs reviewed at bedside   Mammogram UTD  Colonoscopy UTD  Immunizations- TDAP UTD  GYN- [facility] UTD   Hyperlipidemia- treating with diet, supplements  She had mallett finger treated with PT and with orthopedics   Dr. Richardine Service doctor in   Dermatology -followed by 1-2 times a year ,for skin checks   Migraines has imitrex on hand   Osteopenia- on calcium and Vitamin D     Review Of Systems:  GEN- denies fatigue, fever, weight loss,weakness, recent illness HEENT- denies eye drainage, change in vision, nasal discharge, CVS- denies chest pain, palpitations RESP- denies SOB, cough, wheeze ABD- denies N/V, change in stools, abd pain GU- denies dysuria, hematuria, dribbling, incontinence MSK- denies joint pain, muscle aches, injury Neuro- denies headache, dizziness, syncope, seizure activity       Objective:    BP 102/64   Pulse 88   Temp 98.1 F (36.7 C) (Temporal)   Resp 14   Ht 5' 1.5" (1.562 m)   Wt 123 lb (55.8 kg)   SpO2 96%   BMI 22.86 kg/m  GEN- NAD, alert and oriented x3 HEENT- PERRL, EOMI, non injected sclera, pink conjunctiva, MMM, oropharynx clear Neck- Supple, no thyromegaly CVS- RRR, no murmur RESP-CTAB ABD-NABS,soft,NT,ND Psych- normal affect and mood  EXT- No edema Pulses- Radial, DP- 2+  CAGE/FALL/Depression screen negative       Assessment & Plan:      Problem List Items Addressed This Visit      Unprioritized   Hyperlipidemia    Improved with dietary changes Natural plant sterols supplements      Migraines    Prn imitrex Migraines well controlled       Osteopenia    Discussed adding resistance training to her work outs for bone density Repeat scan in 2022      Thrombocytosis ([place])    She has had elevated  PLT for  Many years Between 450-500's Highest was  580 in past few years, but now down to 502 No symptoms, ? If genetic She will have repeat CBC and smear in 3 months If it rebounds back up, will get appt with hematology       Relevant Orders   CBC with Differential/Platelet   Pathologist smear review    Other Visit Diagnoses    Routine general medical examination at a health care facility    -  Primary   CPE done, declines shingrix, discused COVID-19 she is contenplating vaccine, labs reviewed       Note: This dictation was prepared with Dragon dictation along with smaller phrase technology. Any transcriptional errors that result from this process are unintentional.

## 2019-07-19 NOTE — Assessment & Plan Note (Signed)
Improved with dietary changes Natural plant sterols supplements

## 2019-07-19 NOTE — Assessment & Plan Note (Signed)
Discussed adding resistance training to her work outs for bone density Repeat scan in 2022

## 2019-07-19 NOTE — Assessment & Plan Note (Signed)
Prn imitrex Migraines well controlled

## 2019-07-19 NOTE — Assessment & Plan Note (Signed)
She has had elevated PLT for  Many years Between 450-500's Highest was  580 in past few years, but now down to 502 No symptoms, ? If genetic She will have repeat CBC and smear in 3 months If it rebounds back up, will get appt with hematology

## 2019-08-01 ENCOUNTER — Telehealth: Payer: Self-pay | Admitting: Family Medicine

## 2019-08-01 NOTE — Telephone Encounter (Signed)
Patient left vm stating she is sorry she missed your call.   She understood the instructions you left for her. She will come here and do her repeat labs.  CB# [phone]

## 2019-09-29 DIAGNOSIS — H5213 Myopia, bilateral: Secondary | ICD-10-CM | POA: Diagnosis not present

## 2019-09-29 DIAGNOSIS — H25011 Cortical age-related cataract, right eye: Secondary | ICD-10-CM | POA: Diagnosis not present

## 2019-09-29 DIAGNOSIS — H2513 Age-related nuclear cataract, bilateral: Secondary | ICD-10-CM | POA: Diagnosis not present

## 2019-09-29 DIAGNOSIS — H524 Presbyopia: Secondary | ICD-10-CM | POA: Diagnosis not present

## 2019-09-29 DIAGNOSIS — H52203 Unspecified astigmatism, bilateral: Secondary | ICD-10-CM | POA: Diagnosis not present

## 2020-02-13 ENCOUNTER — Emergency Department (HOSPITAL_COMMUNITY): Payer: 59

## 2020-02-13 ENCOUNTER — Emergency Department (HOSPITAL_COMMUNITY)
Admission: EM | Admit: 2020-02-13 | Discharge: 2020-02-14 | Disposition: A | Discharge status: Home / Self Care | Payer: 59 | Attending: Emergency Medicine | Admitting: Emergency Medicine

## 2020-02-13 ENCOUNTER — Encounter (HOSPITAL_COMMUNITY): Payer: Self-pay | Admitting: Emergency Medicine

## 2020-02-13 ENCOUNTER — Other Ambulatory Visit: Payer: Self-pay

## 2020-02-13 DIAGNOSIS — S52501A Unspecified fracture of the lower end of right radius, initial encounter for closed fracture: Secondary | ICD-10-CM | POA: Diagnosis not present

## 2020-02-13 DIAGNOSIS — W000XXA Fall on same level due to ice and snow, initial encounter: Secondary | ICD-10-CM | POA: Insufficient documentation

## 2020-02-13 DIAGNOSIS — Z79899 Other long term (current) drug therapy: Secondary | ICD-10-CM | POA: Insufficient documentation

## 2020-02-13 DIAGNOSIS — M25531 Pain in right wrist: Secondary | ICD-10-CM | POA: Diagnosis present

## 2020-02-13 DIAGNOSIS — Z4789 Encounter for other orthopedic aftercare: Secondary | ICD-10-CM | POA: Diagnosis not present

## 2020-02-13 DIAGNOSIS — Z8781 Personal history of (healed) traumatic fracture: Secondary | ICD-10-CM

## 2020-02-13 DIAGNOSIS — M7989 Other specified soft tissue disorders: Secondary | ICD-10-CM | POA: Diagnosis not present

## 2020-02-13 MED ORDER — PROPOFOL 10 MG/ML IV BOLUS
0.5000 mg/kg | Freq: Once | INTRAVENOUS | Status: AC
  Administered 2020-02-13: 29.1 mg via INTRAVENOUS
  Filled 2020-02-13: qty 20

## 2020-02-13 NOTE — ED Provider Notes (Signed)
[facility] EMERGENCY DEPARTMENT Provider Note   CSN: [id number] Arrival date & time: 02/13/20  2054     History Chief Complaint  Patient presents with   Wrist Pain    Kylie Duffy is a 65 y.o. female.  Patient is a 65 year old female with history of osteopenia, migraines.  She presents today for evaluation of a right wrist injury.  Patient slipped on the ice this afternoon and caught herself with an outstretched hand.  She has pain and deformity to the right wrist.  She denies other injury.  The history is provided by the patient.  Wrist Pain This is a new problem. The current episode started 1 to 2 hours ago. The problem occurs constantly. The problem has not changed since onset.Exacerbated by: Movement and palpation. Nothing relieves the symptoms. She has tried nothing for the symptoms.       Past Medical History:  Diagnosis Date   Burning with urination 11/01/2014   Hematuria 07/28/2013   History of UTI    Hyperlipidemia    Osteopenia    PONV (postoperative nausea and vomiting)    Vaginal dryness 12/03/2012    Patient Active Problem List   Diagnosis Date Noted   Thrombocytosis 07/19/2019   Well woman exam with routine gynecological exam 02/11/2018   Screening for colorectal cancer 02/11/2018   Pituitary abnormality (HCC) 12/26/2016   Hyperlipidemia 06/30/2016   Osteopenia 01/04/2014   Migraines 01/04/2014   Medial meniscus, posterior horn derangement 01/29/2012   Chondromalacia of patella, left 01/29/2012    Past Surgical History:  Procedure Laterality Date   ABDOMINAL HYSTERECTOMY     BREAST BIOPSY Left    COLONOSCOPY N/A 08/25/2016   Procedure: COLONOSCOPY;  Surgeon: West BaliFields, Sandi L, MD;  Location: [facility];  Service: Endoscopy;  Laterality: N/A;   DIAGNOSTIC LAPAROSCOPY     KNEE ARTHROSCOPY WITH MEDIAL MENISECTOMY  01/26/2012   Procedure: KNEE ARTHROSCOPY WITH MEDIAL MENISECTOMY;  Surgeon: Vickki HearingStanley E Harrison, MD;  Location: [facility]  ORS;  Service: Orthopedics;  Laterality: Left;   POLYPECTOMY  08/25/2016   Procedure: POLYPECTOMY;  Surgeon: West BaliFields, Sandi L, MD;  Location: [facility];  Service: Endoscopy;;   TONSILLECTOMY       OB History    Gravida  2   Para      Term      Preterm      AB  2   Living        SAB  1   IAB      Ectopic  1   Multiple      Live Births              Family History  Problem Relation Age of Onset   Hypertension Mother    Hyperlipidemia Mother    Thyroid disease Mother    Kidney disease Mother    Heart disease Father    Hyperlipidemia Father    Cancer Maternal Aunt    Cancer Maternal Uncle    Cancer Paternal Aunt    Cancer Paternal Uncle    Diabetes Maternal Grandmother    Cancer Paternal Grandmother        pancreatic   Hyperlipidemia Brother        borderline   Cancer Paternal Uncle    Alzheimer's disease Paternal Aunt    Colon cancer Neg Hx    Colon polyps Neg Hx    Breast cancer Neg Hx     Social History   Tobacco Use  Smoking status: Never Smoker   Smokeless tobacco: Never Used  Vaping Use   Vaping Use: Never used  Substance Use Topics   Alcohol use: Not Currently   Drug use: No    Home Medications Prior to Admission medications   Medication Sig Start Date End Date Taking? Authorizing Provider  Calcium Carbonate-Vitamin D (CALCIUM 600 + D PO) Take by mouth daily. Takes 2 chewables daily    [provider]  ibuprofen (ADVIL,MOTRIN) 200 MG tablet Take 400 mg by mouth daily as needed for headache or moderate pain.    [provider]  Multiple Vitamin (MULITIVITAMIN WITH MINERALS) TABS Take 1 tablet by mouth daily.    [provider]  OVER THE COUNTER MEDICATION Take 2 each by mouth daily. Cyruta     [provider]  OVER THE COUNTER MEDICATION Take 4 each by mouth daily. Cholaplex    [provider]  SUMAtriptan (IMITREX) 100 MG tablet Take 1 tablet (100 mg total) by  mouth every 2 (two) hours as needed for migraine or headache. May repeat in 2 hours if needed. 07/13/18   Alycia Rossetti, MD  Vitamin D, Cholecalciferol, 400 units CAPS Take 1,000 Units by mouth 2 (two) times daily.     [provider]    Allergies    Patient has no known allergies.  Review of Systems   Review of Systems  All other systems reviewed and are negative.   Physical Exam Updated Vital Signs BP 128/85    Pulse 82    Temp 97.8 F (36.6 C) (Oral)    Resp 16    Ht 5\' 3"  (1.6 m)    Wt 58.1 kg    SpO2 99%    BMI 22.67 kg/m   Physical Exam Vitals and nursing note reviewed.  Constitutional:      General: She is not in acute distress.    Appearance: Normal appearance. She is not ill-appearing.  HENT:     Head: Normocephalic and atraumatic.  Pulmonary:     Effort: Pulmonary effort is normal.  Musculoskeletal:     Comments: The right wrist has obvious deformity.  Motor and sensation are intact to all fingers and capillary refill is brisk.  Skin:    General: Skin is warm and dry.  Neurological:     Mental Status: She is alert and oriented to person, place, and time.     ED Results / Procedures / Treatments   Labs (all labs ordered are listed, but only abnormal results are displayed) Labs Reviewed - No data to display  EKG None  Radiology DG Wrist Complete Right  Result Date: 02/13/2020 CLINICAL DATA:  Initial evaluation for acute trauma, fall, deformity. EXAM: RIGHT WRIST - COMPLETE 3+ VIEW COMPARISON:  None. FINDINGS: Acute comminuted and impacted fracture with dorsal angulation seen involving the distal right radius. Associated dorsal angulation. Distal ulna intact. Underlying osteopenia. Diffuse soft tissue swelling and deformity about the wrist. IMPRESSION: 1. Acute comminuted and impacted fracture with dorsal angulation involving the distal right radius. 2. Underlying osteopenia. Electronically Signed   By: Jeannine Boga M.D.   On: 02/13/2020 21:33     Procedures .Sedation  Date/Time: 02/14/2020 12:19 AM Performed by: Veryl Speak, MD Authorized by: Veryl Speak, MD   Consent:    Consent obtained:  Verbal   Consent given by:  Patient   Risks discussed:  Allergic reaction, dysrhythmia, inadequate sedation, nausea, prolonged hypoxia resulting in organ damage, prolonged sedation necessitating reversal, respiratory  compromise necessitating ventilatory assistance and intubation and vomiting   Alternatives discussed:  Analgesia without sedation, anxiolysis and regional anesthesia Universal protocol:    Procedure explained and questions answered to patient or proxy's satisfaction: yes     Relevant documents present and verified: yes     Test results available: yes     Imaging studies available: yes     Required blood products, implants, devices, and special equipment available: yes     Site/side marked: yes     Immediately prior to procedure, a time out was called: yes     Patient identity confirmed:  Verbally with patient Indications:    Procedure necessitating sedation performed by:  Physician performing sedation Pre-sedation assessment:    Time since last food or drink:  5 hours   ASA classification: class 1 - normal, healthy patient     Mouth opening:  3 or more finger widths   Thyromental distance:  4 finger widths   Mallampati score:  I - soft palate, uvula, fauces, pillars visible   Neck mobility: normal     Pre-sedation assessments completed and reviewed: airway patency, cardiovascular function, hydration status, mental status, nausea/vomiting, pain level, respiratory function and temperature   Immediate pre-procedure details:    Reassessment: Patient reassessed immediately prior to procedure     Reviewed: vital signs, relevant labs/tests and NPO status     Verified: bag valve mask available, emergency equipment available, intubation equipment available, IV patency confirmed, oxygen available and suction available    Procedure details (see MAR for exact dosages):    Preoxygenation:  Nasal cannula   Sedation:  Propofol   Intended level of sedation: deep   Intra-procedure monitoring:  Blood pressure monitoring, cardiac monitor, continuous pulse oximetry, frequent LOC assessments, frequent vital sign checks and continuous capnometry   Intra-procedure events: none     Total Provider sedation time (minutes):  10 Post-procedure details:    Attendance: Constant attendance by certified staff until patient recovered     Recovery: Patient returned to pre-procedure baseline     Post-sedation assessments completed and reviewed: airway patency, cardiovascular function, hydration status, mental status, nausea/vomiting, pain level, respiratory function and temperature     Patient is stable for discharge or admission: yes     Procedure completion:  Tolerated well, no immediate complications Reduction of fracture  Date/Time: 02/14/2020 12:19 AM Performed by: Veryl Speak, MD Authorized by: Veryl Speak, MD  Consent: Verbal consent obtained. Written consent obtained. Risks and benefits: risks, benefits and alternatives were discussed Consent given by: patient Patient understanding: patient states understanding of the procedure being performed Patient consent: the patient's understanding of the procedure matches consent given Procedure consent: procedure consent matches procedure scheduled Relevant documents: relevant documents present and verified Test results: test results available and properly labeled Site marked: the operative site was marked Imaging studies: imaging studies available Required items: required blood products, implants, devices, and special equipment available Patient identity confirmed: verbally with patient and arm band Time out: Immediately prior to procedure a "time out" was called to verify the correct patient, procedure, equipment, support staff and site/side marked as required. Local  anesthesia used: no  Anesthesia: Local anesthesia used: no  Sedation: Patient sedated: yes Sedation type: moderate (conscious) sedation Sedatives: propofol Sedation start date/time: 02/13/2020 11:30 PM Sedation end date/time: 02/13/2020 11:45 PM  Patient tolerance: patient tolerated the procedure well with no immediate complications Comments: Postreduction films show improvement in the alignment of the radius fracture.    (including critical  care time)  Medications Ordered in ED Medications  propofol (DIPRIVAN) 10 mg/mL bolus/IV push 29.1 mg (has no administration in time range)    ED Course  I have reviewed the triage vital signs and the nursing notes.  Pertinent labs & imaging results that were available during my care of the patient were reviewed by me and considered in my medical decision making (see chart for details).    MDM Rules/Calculators/A&P  Patient presenting after a slip and fall on the ice causing her to injure her right wrist.  She has a comminuted, angulated fracture of the distal radius.  Patient underwent conscious sedation and reduction of the fracture.  She is placed in a fiberglass splint and is to follow-up with orthopedics in the next 2 days.  Final Clinical Impression(s) / ED Diagnoses Final diagnoses:  None    Rx / DC Orders ED Discharge Orders    None       Veryl Speak, MD 02/14/20 0021

## 2020-02-13 NOTE — ED Triage Notes (Signed)
Pt slipped on ice and has deformity to the right wrist.

## 2020-02-14 DIAGNOSIS — Z4789 Encounter for other orthopedic aftercare: Secondary | ICD-10-CM | POA: Diagnosis not present

## 2020-02-14 DIAGNOSIS — Z79899 Other long term (current) drug therapy: Secondary | ICD-10-CM | POA: Diagnosis not present

## 2020-02-14 DIAGNOSIS — S52501A Unspecified fracture of the lower end of right radius, initial encounter for closed fracture: Secondary | ICD-10-CM | POA: Diagnosis not present

## 2020-02-14 MED ORDER — HYDROCODONE-ACETAMINOPHEN 5-325 MG PO TABS: 1.0000 | ORAL_TABLET | Freq: Four times a day (QID) | ORAL | 0 refills | Status: DC | PRN

## 2020-02-14 MED ORDER — HYDROCODONE-ACETAMINOPHEN 5-325 MG PO TABS: 2.0000 | ORAL_TABLET | ORAL | 0 refills | Status: DC | PRN

## 2020-02-14 MED ORDER — HYDROCODONE-ACETAMINOPHEN 5-325 MG PO TABS
2.0000 | ORAL_TABLET | Freq: Once | ORAL | Status: AC
  Administered 2020-02-14: 2 via ORAL
  Filled 2020-02-14: qty 2

## 2020-02-14 NOTE — Discharge Instructions (Addendum)
Take hydrocodone as prescribed as needed for pain.  Ice your wrist for 20 minutes every 2 hours while awake for the next 2 days.  Follow-up with Dr. Aline Brochure in the next 1 to 2 days.  His contact information has been provided in this discharge summary for you to call and make these arrangements.

## 2020-02-15 ENCOUNTER — Other Ambulatory Visit: Payer: Self-pay

## 2020-02-15 ENCOUNTER — Encounter: Payer: Self-pay | Admitting: Orthopedic Surgery

## 2020-02-15 ENCOUNTER — Ambulatory Visit (INDEPENDENT_AMBULATORY_CARE_PROVIDER_SITE_OTHER): Payer: 59 | Admitting: Orthopedic Surgery

## 2020-02-15 VITALS — BP 142/80 | HR 89 | Ht 63.0 in | Wt 128.0 lb

## 2020-02-15 DIAGNOSIS — S52531A Colles' fracture of right radius, initial encounter for closed fracture: Secondary | ICD-10-CM | POA: Diagnosis not present

## 2020-02-15 MED FILL — Hydrocodone-Acetaminophen Tab 5-325 MG: ORAL | Qty: 6 | Status: AC

## 2020-02-15 NOTE — Patient Instructions (Signed)
YOU ARE BEING SCHEDULED FOR PINNING OF THE RIGHT WRIST

## 2020-02-15 NOTE — Progress Notes (Signed)
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  The fracture reduction is excellent however it is at risk for settling and then she can have some ulnar positive variance symptoms  Discussed the options and I recommended she have this pinned to stabilize the wrist  She is in agreement not wanting to risk having to have further surgery or compromising wrist function  Closed percutaneous pinning right wrist  Chief Complaint  Patient presents with  . 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  She had closed reduction under sedation in the emergency room with good reduction presents with splint complaining of moderate pain   Review of Systems  All other systems reviewed and are negative.    Past Medical History:  Diagnosis Date  . Burning with urination 11/01/2014  . Hematuria 07/28/2013  . History of UTI   . Hyperlipidemia   . Osteopenia   . PONV (postoperative nausea and vomiting)   . Vaginal dryness 12/03/2012    Past Surgical History:  Procedure Laterality Date  . ABDOMINAL HYSTERECTOMY    . BREAST BIOPSY Left   . COLONOSCOPY N/A 08/25/2016   Procedure: COLONOSCOPY;  Surgeon: Danie Binder, MD;  Location: [facility];  Service: Endoscopy;  Laterality: N/A;  . DIAGNOSTIC LAPAROSCOPY    . KNEE ARTHROSCOPY WITH MEDIAL MENISECTOMY  01/26/2012   Procedure: KNEE ARTHROSCOPY WITH MEDIAL MENISECTOMY;  Surgeon: Carole Civil, MD;  Location: [facility];  Service: Orthopedics;  Laterality: Left;  . POLYPECTOMY  08/25/2016   Procedure: POLYPECTOMY;  Surgeon: Danie Binder, MD;  Location: [facility];  Service: Endoscopy;;  . TONSILLECTOMY      Family History  Problem Relation Age of Onset  . Hypertension Mother   . Hyperlipidemia Mother   . Thyroid disease Mother   . Kidney  disease Mother   . Heart disease Father   . Hyperlipidemia Father   . Cancer Maternal Aunt   . Cancer Maternal Uncle   . Cancer Paternal Aunt   . Cancer Paternal Uncle   . Diabetes Maternal Grandmother   . Cancer Paternal Grandmother        pancreatic  . Hyperlipidemia Brother        borderline  . Cancer Paternal Uncle   . Alzheimer's disease Paternal Aunt   . Colon cancer Neg Hx   . Colon polyps Neg Hx   . Breast cancer Neg Hx    Social History   Tobacco Use  . Smoking status: Never Smoker  . Smokeless tobacco: Never Used  Vaping Use  . Vaping Use: Never used  Substance Use Topics  . Alcohol use: Not Currently  . Drug use: No    No Known Allergies  Current Meds  Medication Sig  . Calcium Carbonate-Vitamin D (CALCIUM 600 + D PO) Take by mouth daily. Takes 2 chewables daily  . HYDROcodone-acetaminophen (NORCO) 5-325 MG tablet Take 2 tablets by mouth every 4 (four) hours as needed.  Marland Kitchen HYDROcodone-acetaminophen (NORCO) 5-325 MG tablet Take 1-2 tablets by mouth every 6 (six) hours as needed.  Marland Kitchen ibuprofen (ADVIL,MOTRIN) 200 MG tablet Take 400 mg by mouth daily as needed for headache or moderate pain.  . Multiple Vitamin (MULITIVITAMIN WITH MINERALS) TABS Take 1 tablet by mouth daily.  Marland Kitchen OVER THE COUNTER MEDICATION Take 2 each  by mouth daily. Cyruta  . OVER THE COUNTER MEDICATION Take 4 each by mouth daily. Cholaplex  . SUMAtriptan (IMITREX) 100 MG tablet Take 1 tablet (100 mg total) by mouth every 2 (two) hours as needed for migraine or headache. May repeat in 2 hours if needed.  . Vitamin D, Cholecalciferol, 400 units CAPS Take 1,000 Units by mouth 2 (two) times daily.     BP (!) 142/80   Pulse 89   Ht 5\' 3"  (1.6 m)   Wt 128 lb (58.1 kg)   BMI 22.67 kg/m   Physical Exam Vitals and nursing note reviewed.  Constitutional:      Appearance: Normal appearance.  Neurological:     Mental Status: She is alert and oriented to person, place, and time.  Psychiatric:         Mood and Affect: Mood normal.     Ortho Exam  Right wrist is in a splint fingers are slightly swollen color and capillary refill are good sensation is normal    MEDICAL DECISION MAKING  A.  Encounter Diagnosis  Name Primary?  . Closed Colles' fracture of right radius, initial encounter Yes    B. DATA ANALYSED:   IMAGING: Interpretation of images:  Images taken on the 17th from the hospital first set of x-rays 3 views right wrist show dinner fork typical deformity with shortening of the right wrist  The second set of films postreduction shows improved near anatomic alignment with normal radial inclination normal ulnar variance   Orders: Surgery recommended Outside records reviewed: Emergency room  C. MANAGEMENT   Closed treatment percutaneous pinning right wrist No orders of the defined types were placed in this encounter.     Arther Abbott, MD  02/15/2020 4:44 PM

## 2020-02-16 ENCOUNTER — Encounter (HOSPITAL_COMMUNITY): Payer: Self-pay | Admitting: Anesthesiology

## 2020-02-16 ENCOUNTER — Encounter (HOSPITAL_COMMUNITY)
Admission: RE | Admit: 2020-02-16 | Discharge: 2020-02-16 | Disposition: A | Discharge status: Home / Self Care | Payer: 59 | Source: Ambulatory Visit | Attending: Orthopedic Surgery | Admitting: Orthopedic Surgery

## 2020-02-16 ENCOUNTER — Other Ambulatory Visit: Payer: Self-pay

## 2020-02-16 ENCOUNTER — Other Ambulatory Visit (HOSPITAL_COMMUNITY)
Admission: RE | Admit: 2020-02-16 | Discharge: 2020-02-16 | Disposition: A | Discharge status: Home / Self Care | Payer: 59 | Source: Ambulatory Visit | Attending: Orthopedic Surgery | Admitting: Orthopedic Surgery

## 2020-02-16 ENCOUNTER — Encounter (HOSPITAL_COMMUNITY): Admission: RE | Admit: 2020-02-16 | Payer: 59 | Source: Ambulatory Visit

## 2020-02-16 ENCOUNTER — Encounter (HOSPITAL_COMMUNITY): Payer: Self-pay

## 2020-02-16 ENCOUNTER — Encounter: Payer: Self-pay | Admitting: Orthopedic Surgery

## 2020-02-16 DIAGNOSIS — Z20822 Contact with and (suspected) exposure to covid-19: Secondary | ICD-10-CM | POA: Diagnosis not present

## 2020-02-16 DIAGNOSIS — Z01812 Encounter for preprocedural laboratory examination: Secondary | ICD-10-CM | POA: Insufficient documentation

## 2020-02-16 LAB — CBC WITH DIFFERENTIAL/PLATELET
Abs Immature Granulocytes: 0.05 10*3/uL (ref 0.00–0.07)
Basophils Absolute: 0.1 10*3/uL (ref 0.0–0.1)
Basophils Relative: 1 %
Eosinophils Absolute: 0.2 10*3/uL (ref 0.0–0.5)
Eosinophils Relative: 2 %
HCT: 43.1 % (ref 36.0–46.0)
Hemoglobin: 14 g/dL (ref 12.0–15.0)
Immature Granulocytes: 1 %
Lymphocytes Relative: 22 %
Lymphs Abs: 2.3 10*3/uL (ref 0.7–4.0)
MCH: 30.1 pg (ref 26.0–34.0)
MCHC: 32.5 g/dL (ref 30.0–36.0)
MCV: 92.7 fL (ref 80.0–100.0)
Monocytes Absolute: 0.6 10*3/uL (ref 0.1–1.0)
Monocytes Relative: 6 %
Neutro Abs: 7.1 10*3/uL (ref 1.7–7.7)
Neutrophils Relative %: 68 %
Platelets: 595 10*3/uL — ABNORMAL HIGH (ref 150–400)
RBC: 4.65 MIL/uL (ref 3.87–5.11)
RDW: 13.9 % (ref 11.5–15.5)
WBC: 10.3 10*3/uL (ref 4.0–10.5)
nRBC: 0 % (ref 0.0–0.2)

## 2020-02-16 LAB — BASIC METABOLIC PANEL
Anion gap: 11 (ref 5–15)
BUN: 19 mg/dL (ref 8–23)
CO2: 24 mmol/L (ref 22–32)
Calcium: 9.2 mg/dL (ref 8.9–10.3)
Chloride: 101 mmol/L (ref 98–111)
Creatinine, Ser: 0.67 mg/dL (ref 0.44–1.00)
GFR, Estimated: >60 mL/min (ref >60)
Glucose, Bld: 101 mg/dL — ABNORMAL HIGH (ref 70–99)
Potassium: 3.9 mmol/L (ref 3.5–5.1)
Sodium: 136 mmol/L (ref 135–145)

## 2020-02-16 LAB — SARS CORONAVIRUS 2 (TAT 6-24 HRS): SARS Coronavirus 2: NEGATIVE

## 2020-02-16 NOTE — Patient Instructions (Signed)
Kylie Duffy  02/16/2020     @PREFPERIOPPHARMACY @   Your procedure is scheduled on  02/17/2020.   Report to [facility] at [phone]  A.M.   Call this number if you have problems the morning of surgery:   [phone]   Remember:  Do not eat or drink after midnight.                        Take these medicines the morning of surgery with A SIP OF WATER  Hydrocodone(if needed), imitrex(if needed).   Brush your teeth before you come to the hopsital.  Do not wear jewelry, make-up or nail polish.  Do not wear lotions, powders, or perfumes, or deodorant.  Do not shave 48 hours prior to surgery.  Men may shave face and neck.  Do not bring valuables to the hospital.  [facility] is not responsible for any belongings or valuables.  Contacts, dentures or bridgework may not be worn into surgery.  Leave your suitcase in the car.  After surgery it may be brought to your room.  For patients admitted to the hospital, discharge time will be determined by your treatment team.  Patients discharged the day of surgery will not be allowed to drive home and you will need someone with your for 24 hours.   Special instructions:  Take a shower with CHG the night before and the morning of your procedure. DO NOT put CHG on your face, hair or genitals. Dry off with a clean towel, put on clean clothes to sleep in and place clean sheets on your bed to sleep on. After your morning shower, put on clean comfortable clothes to wear to the hospital.   DO NOT smoke (tobacco or vaping) the morning of your procedure.     Please read over the following fact sheets that you were given. Anesthesia Post-op Instructions and Care and Recovery After Surgery       Wrist Fracture Treated With ORIF, Care After This sheet gives you information about how to care for yourself after your procedure. Your health care provider may also give you more specific instructions. If you have problems or questions,  contact your health care provider. What can I expect after the procedure? After the procedure, it is common to have:  Pain.  Swelling.  Stiffness.  A small amount of drainage from the incision. Follow these instructions at home: If you have a cast:  Do not stick anything inside the cast to scratch your skin. Doing that increases your risk of infection.  Check the skin around the cast every day. Tell your health care provider about any concerns.  You may put lotion on dry skin around the edges of the cast. Do not put lotion on the skin underneath the cast.  Keep the cast clean and dry. If you have a splint or sling:  Wear the splint or sling as told by your health care provider. Remove it only as told by your health care provider.  Loosen the splint or sling if your fingers tingle, become numb, or turn cold and blue.  Keep the splint or sling clean and dry. Bathing  Do not take baths, swim, or use a hot tub until your health care provider approves. Ask your health care provider if you may take showers. You may only be allowed to take sponge baths.  If your cast, splint, or sling is not  waterproof: ? Do not let it get wet. ? Cover it with a watertight covering when you take a bath or shower.  If you have a sling, remove it for bathing only if your health care provider tells you it is safe to do that.  Keep the bandage (dressing) dry until your health care provider says it can be removed. Incision care  Follow instructions from your health care provider about how to take care of your incision. Make sure you: ? Wash your hands with soap and water for at least 20 seconds before and after you change your dressing. If soap and water are not available, use hand sanitizer. ? Change your dressing as told by your health care provider. ? Leave stitches (sutures), skin glue, or adhesive strips in place. These skin closures may need to stay in place for 2 weeks or longer. If adhesive  strip edges start to loosen and curl up, you may trim the loose edges. Do not remove adhesive strips completely unless your health care provider tells you to do that.  Check your incision area every day for signs of infection. Check for: ? Redness. ? More swelling or pain. ? Blood or more fluid. ? Warmth. ? Pus or a bad smell.   Managing pain, stiffness, and swelling  If directed, put ice on the injured area. To do this: ? If you have a removable splint or sling, remove it as told by your health care provider. ? Put ice in a plastic bag. ? Place a towel between your skin and the bag or between your cast and the bag. ? Leave the ice on for 20 minutes, 2-3 times a day. ? Remove the ice if your skin turns bright red. This is very important. If you cannot feel pain, heat, or cold, you have a greater risk of damage to the area.  Move your fingers often to reduce stiffness and swelling.  Raise (elevate) the injured area above the level of your heart while you are sitting or lying down.   Driving  If you were given a sedative during the procedure, it can affect you for several hours. Do not drive or operate machinery until your health care provider says that it is safe.  Ask your health care provider when it is safe to drive if you have a cast, splint, or sling on your wrist. Activity  Return to your normal activities as told by your health care provider. Ask your health care provider what activities are safe for you.  Do exercises as told by your health care provider.  Do not lift with or put weight on your injured wrist until your health care provider approves.  Avoid pulling and pushing. Medicines  Take over-the-counter and prescription medicines only as told by your health care provider.  Ask your health care provider if the medicine prescribed to you: ? Requires you to avoid driving or using machinery. ? Can cause constipation. You may need to take these actions to prevent or  treat constipation:  Drink enough fluid to keep your urine pale yellow.  Take over-the-counter or prescription medicines.  Eat foods that are high in fiber, such as beans, whole grains, and fresh fruits and vegetables.  Limit foods that are high in fat and processed sugars, such as fried or sweet foods. General instructions  Do not put pressure on any part of the cast or splint until it is fully hardened. This may take several hours.  Do not use any  products that contain nicotine or tobacco, such as cigarettes, e-cigarettes, and chewing tobacco. These can delay bone healing after surgery. If you need help quitting, ask your health care provider.  Keep all follow-up visits. This is important. Contact a health care provider if:  Your cast, splint, or sling is damaged or loose.  Your pain is not controlled with medicine.  You have any of these signs of infection: ? A fever. ? Redness around your incision. ? More swelling or pain around your incision. ? Blood or more fluid coming from your incision. ? Warmth coming from your incision. ? Pus or a bad smell coming from your incision or your dressing.  You develop a rash. Get help right away if:  Your skin or fingers on your injured arm turn blue or gray.  Your arm feels cold or numb.  You have severe pain in your injured wrist.  You have trouble breathing.  You feel faint or light-headed. Summary  After the procedure, it is common to have pain, swelling, stiffness, and a small amount of drainage from the incision.  You may use ice, elevation, and pain medicine as told by your health care provider to reduce pain and swelling.  Wear your splint or sling as told by your health care provider.  Do not lift with or put weight on your injured wrist until your health care provider approves. This information is not intended to replace advice given to you by your health care provider. Make sure you discuss any questions you have  with your health care provider. Document Revised: 04/26/2019 Document Reviewed: 04/26/2019 Elsevier Patient Education  2021 [place].  Closed Reduction for Wrist or Forearm, Care After This sheet gives you information about how to care for yourself after your procedure. Your health care provider may also give you more specific instructions. If you have problems or questions, contact your health care provider. What can I expect after the procedure? After the procedure, it is common to have:  Pain.  Swelling. Follow these instructions at home: If you have a splint:  Wear the splint as told by your health care provider. Remove it only as told by your health care provider.  Loosen the splint if your fingers tingle, become numb, or turn cold and blue.  Keep the splint clean.  If the splint is not waterproof: ? Do not let it get wet. ? Cover it with a watertight covering when you take a bath or shower.   If you have a cast:  Do not stick anything inside the cast to scratch your skin. Doing that increases your risk of infection.  Check the skin around the cast every day. Tell your health care provider about any concerns.  You may put lotion on dry skin around the edges of the cast. Do not put lotion on the skin underneath the cast.  Keep the cast clean.  If the cast is not waterproof: ? Do not let it get wet. ? Cover it with a watertight covering when you take a bath or shower. Managing pain, stiffness, and swelling  If directed, put ice on the injured area. To do this: ? If you have a removable splint, remove it as told by your health care provider. ? Put ice in a plastic bag. ? Place a towel between your skin and the bag or between your cast and the bag. ? Leave the ice on for 20 minutes, 2-3 times per day.  Move your fingers often to  reduce stiffness and swelling.  Raise (elevate) the injured area above the level of your heart while you are sitting or lying down.    Driving  Ask your health care provider if the medicine prescribed to you requires you to avoid driving or using heavy machinery.  Do not drive for 24 hours if you were given a sedative during your procedure.  Ask your health care provider when it is safe to drive if you have a cast or splint on your arm. Activity  Return to your normal activities as told by your health care provider. Ask your health care provider what activities are safe for you.  Do exercises as told by your health care provider. General instructions  Do not put pressure on any part of the cast or splint until it is fully hardened. This may take several hours.  Take over-the-counter and prescription medicines only as told by your health care provider.  Do not use any products that contain nicotine or tobacco, such as cigarettes, e-cigarettes, and chewing tobacco. These can delay bone healing. If you need help quitting, ask your health care provider.  Keep all follow-up visits as told by your health care provider. This is important. Contact a health care provider if:  You have a fever.  Your pain is not controlled by your pain medicine. Get help right away if:  You have severe pain.  You have a severe increase in swelling.  Your fingers become very cold or blue.  You have numbness, tingling, or loss of feeling in your hands or fingers. Summary  After the procedure, it is common to have pain and swelling.  Return to your normal activities as told by your health care provider. Ask your health care provider what activities are safe for you.  Get help right away if you have a severe increase in swelling, your fingers become very cold or blue, or you have numbness, tingling, or loss of feeling in your hands or fingers. This information is not intended to replace advice given to you by your health care provider. Make sure you discuss any questions you have with your health care provider. Document Revised:  08/05/2018 Document Reviewed: 08/05/2018 Elsevier Patient Education  2021 [place] Anesthesia, Adult, Care After This sheet gives you information about how to care for yourself after your procedure. Your health care provider may also give you more specific instructions. If you have problems or questions, contact your health care provider. What can I expect after the procedure? After the procedure, the following side effects are common:  Pain or discomfort at the IV site.  Nausea.  Vomiting.  Sore throat.  Trouble concentrating.  Feeling cold or chills.  Feeling weak or tired.  Sleepiness and fatigue.  Soreness and body aches. These side effects can affect parts of the body that were not involved in surgery. Follow these instructions at home: For the time period you were told by your health care provider:  Rest.  Do not participate in activities where you could fall or become injured.  Do not drive or use machinery.  Do not drink alcohol.  Do not take sleeping pills or medicines that cause drowsiness.  Do not make important decisions or sign legal documents.  Do not take care of children on your own.   Eating and drinking  Follow any instructions from your health care provider about eating or drinking restrictions.  When you feel hungry, start by eating small amounts of foods that are  soft and easy to digest (bland), such as toast. Gradually return to your regular diet.  Drink enough fluid to keep your urine pale yellow.  If you vomit, rehydrate by drinking water, juice, or clear broth. General instructions  If you have sleep apnea, surgery and certain medicines can increase your risk for breathing problems. Follow instructions from your health care provider about wearing your sleep device: ? Anytime you are sleeping, including during daytime naps. ? While taking prescription pain medicines, sleeping medicines, or medicines that make you  drowsy.  Have a responsible adult stay with you for the time you are told. It is important to have someone help care for you until you are awake and alert.  Return to your normal activities as told by your health care provider. Ask your health care provider what activities are safe for you.  Take over-the-counter and prescription medicines only as told by your health care provider.  If you smoke, do not smoke without supervision.  Keep all follow-up visits as told by your health care provider. This is important. Contact a health care provider if:  You have nausea or vomiting that does not get better with medicine.  You cannot eat or drink without vomiting.  You have pain that does not get better with medicine.  You are unable to pass urine.  You develop a skin rash.  You have a fever.  You have redness around your IV site that gets worse. Get help right away if:  You have difficulty breathing.  You have chest pain.  You have blood in your urine or stool, or you vomit blood. Summary  After the procedure, it is common to have a sore throat or nausea. It is also common to feel tired.  Have a responsible adult stay with you for the time you are told. It is important to have someone help care for you until you are awake and alert.  When you feel hungry, start by eating small amounts of foods that are soft and easy to digest (bland), such as toast. Gradually return to your regular diet.  Drink enough fluid to keep your urine pale yellow.  Return to your normal activities as told by your health care provider. Ask your health care provider what activities are safe for you. This information is not intended to replace advice given to you by your health care provider. Make sure you discuss any questions you have with your health care provider. Document Revised: 09/29/2019 Document Reviewed: 04/28/2019 Elsevier Patient Education  2021 [employer].

## 2020-02-17 ENCOUNTER — Ambulatory Visit (INDEPENDENT_AMBULATORY_CARE_PROVIDER_SITE_OTHER): Payer: 59 | Admitting: Orthopedic Surgery

## 2020-02-17 ENCOUNTER — Other Ambulatory Visit (HOSPITAL_COMMUNITY): Payer: 59

## 2020-02-17 ENCOUNTER — Other Ambulatory Visit: Payer: Self-pay

## 2020-02-17 ENCOUNTER — Ambulatory Visit: Payer: 59

## 2020-02-17 DIAGNOSIS — S52531A Colles' fracture of right radius, initial encounter for closed fracture: Secondary | ICD-10-CM | POA: Diagnosis not present

## 2020-02-17 DIAGNOSIS — M79645 Pain in left finger(s): Secondary | ICD-10-CM

## 2020-02-17 NOTE — Progress Notes (Signed)
Chief Complaint  Patient presents with  . Wrist Pain    Cast for left wrist fx   Patients surgery post poned due to Covid staffing issues   Brought in for cast   Cast applied   Fu TBD   xrays left long finger pain 2nd to fall   Poss distal phal frx no trx needed

## 2020-02-20 ENCOUNTER — Encounter: Payer: Self-pay | Admitting: Orthopedic Surgery

## 2020-02-23 ENCOUNTER — Other Ambulatory Visit (HOSPITAL_COMMUNITY)
Admission: RE | Admit: 2020-02-23 | Discharge: 2020-02-23 | Disposition: A | Discharge status: Home / Self Care | Payer: 59 | Source: Ambulatory Visit | Attending: Orthopedic Surgery | Admitting: Orthopedic Surgery

## 2020-02-23 ENCOUNTER — Encounter (HOSPITAL_COMMUNITY)
Admission: RE | Admit: 2020-02-23 | Discharge: 2020-02-23 | Disposition: A | Discharge status: Home / Self Care | Payer: 59 | Source: Ambulatory Visit | Attending: Orthopedic Surgery | Admitting: Orthopedic Surgery

## 2020-02-23 ENCOUNTER — Other Ambulatory Visit: Payer: Self-pay

## 2020-02-23 ENCOUNTER — Ambulatory Visit: Payer: 59 | Admitting: Orthopedic Surgery

## 2020-02-23 DIAGNOSIS — Z20822 Contact with and (suspected) exposure to covid-19: Secondary | ICD-10-CM | POA: Diagnosis not present

## 2020-02-23 DIAGNOSIS — Z01812 Encounter for preprocedural laboratory examination: Secondary | ICD-10-CM | POA: Diagnosis not present

## 2020-02-23 LAB — SARS CORONAVIRUS 2 (TAT 6-24 HRS): SARS Coronavirus 2: NEGATIVE

## 2020-02-23 NOTE — H&P (Signed)
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  The fracture reduction is excellent however it is at risk for settling and then she can have some ulnar positive variance symptoms   Discussed the options and I recommended she have this pinned to stabilize the wrist   She is in agreement not wanting to risk having to have further surgery or compromising wrist function   Closed percutaneous pinning right wrist       Chief Complaint  Patient presents with  . 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  She had closed reduction under sedation in the emergency room with good reduction presents with splint complaining of moderate pain     Review of Systems  All other systems reviewed and are negative.           Past Medical History:  Diagnosis Date  . Burning with urination 11/01/2014  . Hematuria 07/28/2013  . History of UTI    . Hyperlipidemia    . Osteopenia    . PONV (postoperative nausea and vomiting)    . Vaginal dryness 12/03/2012           Past Surgical History:  Procedure Laterality Date  . ABDOMINAL HYSTERECTOMY      . BREAST BIOPSY Left    . COLONOSCOPY N/A 08/25/2016    Procedure: COLONOSCOPY;  Surgeon: Danie Binder, MD;  Location: [facility];  Service: Endoscopy;  Laterality: N/A;  . DIAGNOSTIC LAPAROSCOPY      . KNEE ARTHROSCOPY WITH MEDIAL MENISECTOMY   01/26/2012    Procedure: KNEE ARTHROSCOPY WITH MEDIAL MENISECTOMY;  Surgeon: Carole Civil, MD;  Location: [facility] OR[facility];  Service: Orthopedics;  Laterality: Left;  . POLYPECTOMY   08/25/2016    Procedure: POLYPECTOMY;  Surgeon: Danie Binder, MD;  Location: [facility];  Service: Endoscopy;;  . TONSILLECTOMY               Family History  Problem Relation Age of Onset  . Hypertension Mother    . Hyperlipidemia Mother    .  Thyroid disease Mother    . Kidney disease Mother    . Heart disease Father    . Hyperlipidemia Father    . Cancer Maternal Aunt    . Cancer Maternal Uncle    . Cancer Paternal Aunt    . Cancer Paternal Uncle    . Diabetes Maternal Grandmother    . Cancer Paternal Grandmother          pancreatic  . Hyperlipidemia Brother          borderline  . Cancer Paternal Uncle    . Alzheimer's disease Paternal Aunt    . Colon cancer Neg Hx    . Colon polyps Neg Hx    . Breast cancer Neg Hx      Social History        Tobacco Use  . Smoking status: Never Smoker  . Smokeless tobacco: Never Used  Vaping Use  . Vaping Use: Never used  Substance Use Topics  . Alcohol use: Not Currently  . Drug use: No      No Known Allergies   Active Medications      Current Meds  Medication Sig  . Calcium Carbonate-Vitamin D (CALCIUM 600 + D PO) Take by mouth  daily. Takes 2 chewables daily  . HYDROcodone-acetaminophen (NORCO) 5-325 MG tablet Take 2 tablets by mouth every 4 (four) hours as needed.  Marland Kitchen HYDROcodone-acetaminophen (NORCO) 5-325 MG tablet Take 1-2 tablets by mouth every 6 (six) hours as needed.  Marland Kitchen ibuprofen (ADVIL,MOTRIN) 200 MG tablet Take 400 mg by mouth daily as needed for headache or moderate pain.  . Multiple Vitamin (MULITIVITAMIN WITH MINERALS) TABS Take 1 tablet by mouth daily.  Marland Kitchen OVER THE COUNTER MEDICATION Take 2 each by mouth daily. [place]  . OVER THE COUNTER MEDICATION Take 4 each by mouth daily. Cholaplex  . SUMAtriptan (IMITREX) 100 MG tablet Take 1 tablet (100 mg total) by mouth every 2 (two) hours as needed for migraine or headache. May repeat in 2 hours if needed.  . Vitamin D, Cholecalciferol, 400 units CAPS Take 1,000 Units by mouth 2 (two) times daily.         BP (!) 142/80   Pulse 89   Ht 5\' 3"  (1.6 m)   Wt 128 lb (58.1 kg)   BMI 22.67 kg/m    Physical Exam Vitals and nursing note reviewed.  Constitutional:      Appearance: Normal appearance.  Neurological:      Mental Status: She is alert and oriented to person, place, and time.  Psychiatric:        Mood and Affect: Mood normal.        Ortho Exam   Right wrist is in a splint fingers are slightly swollen color and capillary refill are good sensation is normal       MEDICAL DECISION MAKING   A.      Encounter Diagnosis  Name Primary?  . Closed Colles' fracture of right radius, initial encounter Yes      B. DATA ANALYSED:   IMAGING: Interpretation of images:  Images taken on the 17th from the hospital first set of x-rays 3 views right wrist show dinner fork typical deformity with shortening of the right wrist   The second set of films postreduction shows improved near anatomic alignment with normal radial inclination normal ulnar variance    Orders: Surgery recommended Outside records reviewed: Emergency room  C. MANAGEMENT    Closed treatment percutaneous pinning right wrist No orders of the defined types were placed in this encounter.         Arther Abbott, MD   02/15/2020 4:44 PM

## 2020-02-23 NOTE — Patient Instructions (Signed)
    Kylie Duffy  02/23/2020     @PREFPERIOPPHARMACY @   Your procedure is scheduled on  02/24/2020.   Report to [facility] at  1140  A.M.    Call this number if you have problems the morning of surgery:  [phone]   Remember:  Do not eat or drink after midnight.                       Take these medicines the morning of surgery with A SIP OF WATER  Hydrocodone(if needed), imitrex (if needed).    Please brush your teeth.  Do not wear jewelry, make-up or nail polish.  Do not wear lotions, powders, or perfumes, or deodorant.  Do not shave 48 hours prior to surgery.  Men may shave face and neck.  Do not bring valuables to the hospital.  [facility] is not responsible for any belongings or valuables.  Contacts, dentures or bridgework may not be worn into surgery.  Leave your suitcase in the car.  After surgery it may be brought to your room.  For patients admitted to the hospital, discharge time will be determined by your treatment team.  Patients discharged the day of surgery will not be allowed to drive home and they must have someone with them for 24 hours.    Please read over the following fact sheets that you were given. Coughing and Deep Breathing, Anesthesia Post-op Instructions and Care and Recovery After Surgery

## 2020-02-24 ENCOUNTER — Ambulatory Visit (HOSPITAL_COMMUNITY): Payer: 59

## 2020-02-24 ENCOUNTER — Ambulatory Visit (HOSPITAL_COMMUNITY)
Admission: RE | Admit: 2020-02-24 | Discharge: 2020-02-24 | Disposition: A | Discharge status: Home / Self Care | Payer: 59 | Attending: Orthopedic Surgery | Admitting: Orthopedic Surgery

## 2020-02-24 ENCOUNTER — Ambulatory Visit (HOSPITAL_COMMUNITY): Payer: 59 | Admitting: Anesthesiology

## 2020-02-24 ENCOUNTER — Encounter (HOSPITAL_COMMUNITY)
Admission: RE | Disposition: A | Discharge status: Home / Self Care | Payer: Self-pay | Source: Home / Self Care | Attending: Orthopedic Surgery

## 2020-02-24 ENCOUNTER — Encounter (HOSPITAL_COMMUNITY): Payer: Self-pay | Admitting: Orthopedic Surgery

## 2020-02-24 DIAGNOSIS — S62101A Fracture of unspecified carpal bone, right wrist, initial encounter for closed fracture: Secondary | ICD-10-CM

## 2020-02-24 DIAGNOSIS — Y93K1 Activity, walking an animal: Secondary | ICD-10-CM | POA: Diagnosis not present

## 2020-02-24 DIAGNOSIS — W19XXXA Unspecified fall, initial encounter: Secondary | ICD-10-CM | POA: Insufficient documentation

## 2020-02-24 DIAGNOSIS — S52501A Unspecified fracture of the lower end of right radius, initial encounter for closed fracture: Secondary | ICD-10-CM | POA: Diagnosis not present

## 2020-02-24 HISTORY — PX: CLOSED REDUCTION WRIST FRACTURE: SHX1091

## 2020-02-24 SURGERY — CLOSED REDUCTION, WRIST
Anesthesia: General | Site: Wrist | Laterality: Right

## 2020-02-24 MED ORDER — ONDANSETRON HCL 4 MG/2ML IJ SOLN
4.0000 mg | Freq: Once | INTRAMUSCULAR | Status: AC
  Administered 2020-02-24: 4 mg via INTRAVENOUS
  Filled 2020-02-24: qty 2

## 2020-02-24 MED ORDER — ONDANSETRON HCL 4 MG/2ML IJ SOLN: 4.0000 mg | Freq: Once | INTRAMUSCULAR | Status: DC | PRN

## 2020-02-24 MED ORDER — CHLORHEXIDINE GLUCONATE 0.12 % MT SOLN
15.0000 mL | Freq: Once | OROMUCOSAL | Status: AC
  Administered 2020-02-24: 15 mL via OROMUCOSAL

## 2020-02-24 MED ORDER — HYDROCODONE-ACETAMINOPHEN 5-325 MG PO TABS
1.0000 | ORAL_TABLET | Freq: Once | ORAL | Status: AC
  Administered 2020-02-24: 1 via ORAL
  Filled 2020-02-24: qty 1

## 2020-02-24 MED ORDER — FENTANYL CITRATE (PF) 100 MCG/2ML IJ SOLN
INTRAMUSCULAR | Status: AC
  Filled 2020-02-24: qty 2

## 2020-02-24 MED ORDER — 0.9 % SODIUM CHLORIDE (POUR BTL) OPTIME
TOPICAL | Status: DC | PRN
  Administered 2020-02-24: 1000 mL

## 2020-02-24 MED ORDER — PHENYLEPHRINE 40 MCG/ML (10ML) SYRINGE FOR IV PUSH (FOR BLOOD PRESSURE SUPPORT)
PREFILLED_SYRINGE | INTRAVENOUS | Status: AC
  Filled 2020-02-24: qty 10

## 2020-02-24 MED ORDER — DEXAMETHASONE SODIUM PHOSPHATE 10 MG/ML IJ SOLN
INTRAMUSCULAR | Status: DC | PRN
  Administered 2020-02-24: 4 mg via INTRAVENOUS

## 2020-02-24 MED ORDER — ONDANSETRON HCL 4 MG/2ML IJ SOLN
INTRAMUSCULAR | Status: DC | PRN
  Administered 2020-02-24: 4 mg via INTRAVENOUS

## 2020-02-24 MED ORDER — FENTANYL CITRATE (PF) 250 MCG/5ML IJ SOLN
INTRAMUSCULAR | Status: DC | PRN
  Administered 2020-02-24: 25 ug via INTRAVENOUS
  Administered 2020-02-24: 50 ug via INTRAVENOUS
  Administered 2020-02-24: 25 ug via INTRAVENOUS
  Administered 2020-02-24: 50 ug via INTRAVENOUS

## 2020-02-24 MED ORDER — CEFAZOLIN SODIUM-DEXTROSE 2-4 GM/100ML-% IV SOLN
2.0000 g | INTRAVENOUS | Status: AC
  Administered 2020-02-24: 2 g via INTRAVENOUS

## 2020-02-24 MED ORDER — ORAL CARE MOUTH RINSE: 15.0000 mL | Freq: Once | OROMUCOSAL | Status: AC

## 2020-02-24 MED ORDER — LACTATED RINGERS IV SOLN
INTRAVENOUS | Status: DC
  Administered 2020-02-24: 1000 mL via INTRAVENOUS

## 2020-02-24 MED ORDER — LIDOCAINE HCL (CARDIAC) PF 100 MG/5ML IV SOSY
PREFILLED_SYRINGE | INTRAVENOUS | Status: DC | PRN
  Administered 2020-02-24: 60 mg via INTRAVENOUS

## 2020-02-24 MED ORDER — FENTANYL CITRATE (PF) 100 MCG/2ML IJ SOLN
25.0000 ug | INTRAMUSCULAR | Status: DC | PRN
  Administered 2020-02-24: 50 ug via INTRAVENOUS
  Filled 2020-02-24: qty 2

## 2020-02-24 MED ORDER — HYDROCODONE-ACETAMINOPHEN 5-325 MG PO TABS: 1.0000 | ORAL_TABLET | ORAL | 0 refills | Status: DC | PRN

## 2020-02-24 MED ORDER — MIDAZOLAM HCL 2 MG/2ML IJ SOLN
INTRAMUSCULAR | Status: AC
  Filled 2020-02-24: qty 2

## 2020-02-24 MED ORDER — HYDROCODONE-ACETAMINOPHEN 5-325 MG PO TABS: 1.0000 | ORAL_TABLET | ORAL | 0 refills | Status: AC | PRN

## 2020-02-24 MED ORDER — CEFAZOLIN SODIUM-DEXTROSE 2-4 GM/100ML-% IV SOLN
INTRAVENOUS | Status: AC
  Filled 2020-02-24: qty 100

## 2020-02-24 MED ORDER — BUPIVACAINE HCL (PF) 0.5 % IJ SOLN
INTRAMUSCULAR | Status: DC | PRN
  Administered 2020-02-24: 10 mL

## 2020-02-24 MED ORDER — PHENYLEPHRINE HCL (PRESSORS) 10 MG/ML IV SOLN
INTRAVENOUS | Status: DC | PRN
  Administered 2020-02-24: 80 ug via INTRAVENOUS

## 2020-02-24 MED ORDER — BUPIVACAINE HCL (PF) 0.5 % IJ SOLN
INTRAMUSCULAR | Status: AC
  Filled 2020-02-24: qty 30

## 2020-02-24 MED ORDER — PROPOFOL 10 MG/ML IV BOLUS
INTRAVENOUS | Status: DC | PRN
  Administered 2020-02-24: 200 mg via INTRAVENOUS

## 2020-02-24 MED ORDER — MIDAZOLAM HCL 2 MG/2ML IJ SOLN
INTRAMUSCULAR | Status: DC | PRN
  Administered 2020-02-24: 2 mg via INTRAVENOUS

## 2020-02-24 SURGICAL SUPPLY — 35 items
0.062 k wire ×6 IMPLANT
APL PRP STRL LF DISP 70% ISPRP (MISCELLANEOUS) ×1
BLADE 15 SAFETY STRL DISP (BLADE) ×2 IMPLANT
BNDG CMPR STD VLCR NS LF 5.8X4 (GAUZE/BANDAGES/DRESSINGS) ×2
BNDG COHESIVE 4X5 TAN STRL (GAUZE/BANDAGES/DRESSINGS) ×2 IMPLANT
BNDG ELASTIC 4X5.8 VLCR NS LF (GAUZE/BANDAGES/DRESSINGS) ×4 IMPLANT
BNDG GAUZE ELAST 4 BULKY (GAUZE/BANDAGES/DRESSINGS) ×2 IMPLANT
CHLORAPREP W/TINT 26 (MISCELLANEOUS) ×2 IMPLANT
CLOTH BEACON ORANGE TIMEOUT ST (SAFETY) ×2 IMPLANT
COVER LIGHT HANDLE STERIS (MISCELLANEOUS) ×4 IMPLANT
COVER WAND RF STERILE (DRAPES) ×2 IMPLANT
CUFF TOURN SGL QUICK 18X4 (TOURNIQUET CUFF) ×2 IMPLANT
DRAPE C-ARM FOLDED MOBILE STRL (DRAPES) ×2 IMPLANT
DRSG XEROFORM 1X8 (GAUZE/BANDAGES/DRESSINGS) ×2 IMPLANT
GAUZE SPONGE 4X4 12PLY STRL (GAUZE/BANDAGES/DRESSINGS) ×2 IMPLANT
GAUZE XEROFORM 1X8 LF (GAUZE/BANDAGES/DRESSINGS) ×2 IMPLANT
GLOVE SKINSENSE NS SZ8.0 LF (GLOVE) ×1
GLOVE SKINSENSE STRL SZ8.0 LF (GLOVE) ×1 IMPLANT
GLOVE SS N UNI LF 8.5 STRL (GLOVE) ×2 IMPLANT
GLOVE SURG UNDER POLY LF SZ7 (GLOVE) ×2 IMPLANT
GOWN STRL REUS W/TWL LRG LVL3 (GOWN DISPOSABLE) ×2 IMPLANT
GOWN STRL REUS W/TWL XL LVL3 (GOWN DISPOSABLE) ×2 IMPLANT
KIT TURNOVER KIT A (KITS) ×2 IMPLANT
NS IRRIG 1000ML POUR BTL (IV SOLUTION) ×2 IMPLANT
PACK BASIC LIMB (CUSTOM PROCEDURE TRAY) ×2 IMPLANT
PAD CAST 4YDX4 CTTN HI CHSV (CAST SUPPLIES) ×2 IMPLANT
PADDING CAST COTTON 4X4 STRL (CAST SUPPLIES) ×4
PIN CAPS ORTHO GREEN .062 (PIN) ×2 IMPLANT
SPLINT IMMOBILIZER J 3INX20FT (CAST SUPPLIES) ×1
SPLINT J IMMOBILIZER 3X20FT (CAST SUPPLIES) ×1 IMPLANT
SUT ETHILON 3 0 FSL (SUTURE) ×4 IMPLANT
SUT SILK 2 0 (SUTURE) ×2
SUT SILK 2-0 18XBRD TIE 12 (SUTURE) ×1 IMPLANT
TOWEL OR 17X26 4PK STRL BLUE (TOWEL DISPOSABLE) ×2 IMPLANT
YANKAUER SUCT 12FT TUBE ARGYLE (SUCTIONS) ×2 IMPLANT

## 2020-02-24 NOTE — Transfer of Care (Signed)
Immediate Anesthesia Transfer of Care Note  Patient: Kylie Duffy  Procedure(s) Performed: CLOSED REDUCTION WRIST with pin placement (Right Wrist)  Patient Location: PACU  Anesthesia Type:General  Level of Consciousness: awake  Airway & Oxygen Therapy: Patient Spontanous Breathing  Post-op Assessment: Report given to RN  Post vital signs: Reviewed and stable  Last Vitals:  Vitals Value Taken Time  BP 138/80 02/24/20 1453  Temp    Pulse 75 02/24/20 1455  Resp 14 02/24/20 1455  SpO2 98 % 02/24/20 1455  Vitals shown include unvalidated device data.  Last Pain:  Vitals:   02/24/20 1130  TempSrc: Oral  PainSc: 3       Patients Stated Pain Goal: 6 (57/[id number]/69 [id number])  Complications: No complications documented.

## 2020-02-24 NOTE — Anesthesia Procedure Notes (Signed)
Procedure Name: LMA Insertion Date/Time: 02/24/2020 1:24 PM Performed by: Karna Dupes, CRNA Pre-anesthesia Checklist: Patient identified, Emergency Drugs available, Suction available and Patient being monitored Patient Re-evaluated:Patient Re-evaluated prior to induction Oxygen Delivery Method: Circle system utilized Preoxygenation: Pre-oxygenation with 100% oxygen Induction Type: IV induction LMA: LMA inserted LMA Size: 3.0 Tube secured with: Tape Dental Injury: Teeth and Oropharynx as per pre-operative assessment

## 2020-02-24 NOTE — Op Note (Signed)
02/24/2020 2:53 PM  Operative report  Preop diagnosis closed fracture right distal radius  Postop diagnosis same  Procedure closed reduction percutaneous pinning right wrist  Surgeon Aline Brochure  No assistants  Anesthesia General  Operative findings: Slight loss of reduction from the prior reduction noted in the ER and 1 week ago. Note patient surgery had to be canceled due to hospital being in the red zone  The fracture was closed reduced with traction and manipulation and improvement in alignment was noted and therefore fracture was pinned  Details of procedure  The patient was seen in preop and confirmed to be Ihor Austin right wrist was confirmed the surgical site and marked chart review was completed including images  Patient was taken the operating for general anesthesia followed by sterile prep and drape followed by timeout  Images were taken of the wrist and we noted that the wrist had lost some of its position with some shortening and some dorsal tilting of the distal fragment. This was manipulated and improved with traction using 10 pounds of weight  Images were repeated and the reduction had improved and therefore we proceeded with percutaneous pinning  No tourniquet was used  Skin incision was made over the distal radius at the radial styloid and one 0.62 care was driven into the distal radius. Position was confirmed  A second skin incision was made and a second pin was driven into the distal radius and then a third incision was made and a third pin was driven into the distal radius starting from the proximal radius drilling into the distal fragment  Satisfactory reduction was performed with neutral volar tilt restoration of radial inclination and radial length  The third incision had a large amount of bleeding from a vein and had to be extended to find the bleeding 3 silk ties were placed around the vessels which controlled the bleeding.  All wounds were irrigated  and closed with 3-0 nylon sutures then injected with 10 cc of Marcaine with epinephrine including 3 cc in the fracture site.  Sterile dressing was applied along with sugar tong splint  Patient extubated taken recovery room in stable condition  Cpt code [id number]

## 2020-02-24 NOTE — Discharge Instructions (Signed)
Monitored Anesthesia Care, Care After This sheet gives you information about how to care for yourself after your procedure. Your health care provider may also give you more specific instructions. If you have problems or questions, contact your health care provider. What can I expect after the procedure? After the procedure, it is common to have:  Tiredness.  Forgetfulness about what happened after the procedure.  Impaired judgment for important decisions.  Nausea or vomiting.  Some difficulty with balance. Follow these instructions at home: For the time period you were told by your health care provider:  Rest as needed.  Do not participate in activities where you could fall or become injured.  Do not drive or use machinery.  Do not drink alcohol.  Do not take sleeping pills or medicines that cause drowsiness.  Do not make important decisions or sign legal documents.  Do not take care of children on your own.      Eating and drinking  Follow the diet that is recommended by your health care provider.  Drink enough fluid to keep your urine pale yellow.  If you vomit: ? Drink water, juice, or soup when you can drink without vomiting. ? Make sure you have little or no nausea before eating solid foods. General instructions  Have a responsible adult stay with you for the time you are told. It is important to have someone help care for you until you are awake and alert.  Take over-the-counter and prescription medicines only as told by your health care provider.  If you have sleep apnea, surgery and certain medicines can increase your risk for breathing problems. Follow instructions from your health care provider about wearing your sleep device: ? Anytime you are sleeping, including during daytime naps. ? While taking prescription pain medicines, sleeping medicines, or medicines that make you drowsy.  Avoid smoking.  Keep all follow-up visits as told by your health care  provider. This is important. Contact a health care provider if:  You keep feeling nauseous or you keep vomiting.  You feel light-headed.  You are still sleepy or having trouble with balance after 24 hours.  You develop a rash.  You have a fever.  You have redness or swelling around the IV site. Get help right away if:  You have trouble breathing.  You have new-onset confusion at home. Summary  For several hours after your procedure, you may feel tired. You may also be forgetful and have poor judgment.  Have a responsible adult stay with you for the time you are told. It is important to have someone help care for you until you are awake and alert.  Rest as told. Do not drive or operate machinery. Do not drink alcohol or take sleeping pills.  Get help right away if you have trouble breathing, or if you suddenly become confused. This information is not intended to replace advice given to you by your health care provider. Make sure you discuss any questions you have with your health care provider. Document Revised: 09/29/2019 Document Reviewed: 12/16/2018 Elsevier Patient Education  2021 Elsevier Inc[employer]  

## 2020-02-24 NOTE — Brief Op Note (Signed)
02/24/2020  2:50 PM  PATIENT:  Kylie Duffy  65 y.o. female  PRE-OPERATIVE DIAGNOSIS:  fracture right wrist  POST-OPERATIVE DIAGNOSIS:  fracture right wrist  PROCEDURE:  Procedure(s) with comments: CLOSED REDUCTION WRIST with pin placement (Right) - pt to have labs & covid test AM of procedure  3, 0.62 K-wires   Operative findings Slight loss of reduction from the previous reduction noted 1 week ago. Patient had to be canceled secondary to hospital in the red zone and couldn't do her surgery last week   SURGEON:  Surgeon(s) and Role:    * Carole Civil, MD - Primary  PHYSICIAN ASSISTANT:   ASSISTANTS: none   ANESTHESIA:   general  EBL:  20cc   BLOOD ADMINISTERED:none  DRAINS: none   LOCAL MEDICATIONS USED:  MARCAINE     SPECIMEN:  No Specimen  DISPOSITION OF SPECIMEN:  N/A  COUNTS:  YES  TOURNIQUET:  * Missing tourniquet times found for documented tourniquets in log: [id number] *  DICTATION: .Dragon Dictation  PLAN OF CARE: Discharge to home after PACU  PATIENT DISPOSITION:  PACU - hemodynamically stable.   Delay start of Pharmacological VTE agent (>24hrs) due to surgical blood loss or risk of bleeding: not applicable

## 2020-02-24 NOTE — Anesthesia Preprocedure Evaluation (Signed)
Anesthesia Evaluation  Patient identified by MRN, date of birth, ID band Patient awake    Reviewed: Allergy & Precautions, H&P , NPO status , Patient's Chart, lab work & pertinent test results, reviewed documented beta blocker date and time   History of Anesthesia Complications (+) PONV and history of anesthetic complications  Airway Mallampati: II  TM Distance: >3 FB Neck ROM: full    Dental no notable dental hx.    Pulmonary neg pulmonary ROS,    Pulmonary exam normal breath sounds clear to auscultation       Cardiovascular Exercise Tolerance: Good negative cardio ROS   Rhythm:regular Rate:Normal     Neuro/Psych  Headaches, negative psych ROS   GI/Hepatic negative GI ROS, Neg liver ROS,   Endo/Other  negative endocrine ROS  Renal/GU negative Renal ROS  negative genitourinary   Musculoskeletal   Abdominal   Peds  Hematology negative hematology ROS (+)   Anesthesia Other Findings   Reproductive/Obstetrics negative OB ROS                             Anesthesia Physical Anesthesia Plan  ASA: II  Anesthesia Plan: General   Post-op Pain Management:    Induction:   PONV Risk Score and Plan: Ondansetron  Airway Management Planned:   Additional Equipment:   Intra-op Plan:   Post-operative Plan:   Informed Consent: I have reviewed the patients History and Physical, chart, labs and discussed the procedure including the risks, benefits and alternatives for the proposed anesthesia with the patient or authorized representative who has indicated his/her understanding and acceptance.     Dental Advisory Given  Plan Discussed with: CRNA  Anesthesia Plan Comments:         Anesthesia Quick Evaluation

## 2020-02-24 NOTE — Anesthesia Postprocedure Evaluation (Addendum)
Anesthesia Post Note  Patient: Kylie Duffy  Procedure(s) Performed: CLOSED REDUCTION WRIST with pin placement (Right Wrist)  Patient location during evaluation: PACU Anesthesia Type: General Level of consciousness: awake and alert Pain management: satisfactory to patient Vital Signs Assessment: post-procedure vital signs reviewed and stable Respiratory status: nonlabored ventilation Cardiovascular status: blood pressure returned to baseline and stable Postop Assessment: no apparent nausea or vomiting Anesthetic complications: no Comments: Late entry   No complications documented.   Last Vitals:  Vitals:   02/24/20 1530 02/24/20 1556  BP: 139/86 (!) 142/84  Pulse: 74 76  Resp: 16 18  Temp:  36.8 C  SpO2: 97% 98%    Last Pain:  Vitals:   02/24/20 1556  TempSrc: Oral  PainSc: 6                  Tymon Nemetz

## 2020-02-24 NOTE — Addendum Note (Signed)
Addendum  created 02/24/20 1810 by Ollen Bowl, CRNA   Clinical Note Signed

## 2020-02-24 NOTE — Interval H&P Note (Signed)
History and Physical Interval Note:  02/24/2020 1:15 PM  Kylie Duffy  has presented today for surgery, with the diagnosis of fracture right wrist.  The various methods of treatment have been discussed with the patient and family. After consideration of risks, benefits and other options for treatment, the patient has consented to  Procedure(s) with comments: CLOSED REDUCTION WRIST with pin placement (Right) - pt to have labs & covid test AM of procedure as a surgical intervention.  The patient's history has been reviewed, patient examined, no change in status, stable for surgery.  I have reviewed the patient's chart and labs.  Questions were answered to the patient's satisfaction.     Arther Abbott

## 2020-02-27 ENCOUNTER — Encounter (HOSPITAL_COMMUNITY): Payer: Self-pay | Admitting: Orthopedic Surgery

## 2020-02-29 ENCOUNTER — Ambulatory Visit: Payer: 59

## 2020-02-29 ENCOUNTER — Ambulatory Visit (INDEPENDENT_AMBULATORY_CARE_PROVIDER_SITE_OTHER): Payer: 59 | Admitting: Orthopedic Surgery

## 2020-02-29 ENCOUNTER — Other Ambulatory Visit: Payer: Self-pay

## 2020-02-29 VITALS — Ht 63.0 in | Wt 128.0 lb

## 2020-02-29 DIAGNOSIS — S52531A Colles' fracture of right radius, initial encounter for closed fracture: Secondary | ICD-10-CM | POA: Insufficient documentation

## 2020-02-29 DIAGNOSIS — S52531D Colles' fracture of right radius, subsequent encounter for closed fracture with routine healing: Secondary | ICD-10-CM

## 2020-02-29 NOTE — Progress Notes (Signed)
Chief Complaint  Patient presents with  . Follow-up    Recheck on right wrist, DOS 02-24-20.    Postop pinning right wrist, postop day #5  Splint change  Wound check  Patient's pain is under control.  Her fingers are stiff and swollen.  Wound looks clean dry and intact.  X-rays show maintenance of reduction  Recommend volar splinting active range of motion return for x-rays next visit in cast and sutures out

## 2020-02-29 NOTE — Patient Instructions (Signed)
Start aggressive range of motion moving the fingers

## 2020-03-01 ENCOUNTER — Ambulatory Visit: Payer: 59 | Admitting: Orthopedic Surgery

## 2020-03-08 ENCOUNTER — Ambulatory Visit: Payer: 59

## 2020-03-08 ENCOUNTER — Ambulatory Visit (INDEPENDENT_AMBULATORY_CARE_PROVIDER_SITE_OTHER): Payer: 59 | Admitting: Orthopedic Surgery

## 2020-03-08 ENCOUNTER — Other Ambulatory Visit: Payer: Self-pay

## 2020-03-08 ENCOUNTER — Encounter: Payer: Self-pay | Admitting: Orthopedic Surgery

## 2020-03-08 VITALS — Ht 63.0 in | Wt 128.0 lb

## 2020-03-08 DIAGNOSIS — S52531D Colles' fracture of right radius, subsequent encounter for closed fracture with routine healing: Secondary | ICD-10-CM

## 2020-03-08 NOTE — Progress Notes (Signed)
Chief Complaint  Patient presents with  . Routine Post Op    Rt hand DOS 02/24/20    Status post closed reduction percutaneous pinning right distal radius fracture on January 28 this is postop day #13  Wound check today.  Wounds look clean all sutures were removed  X-rays-reduction maintained; pins intact   RI angle 16-18 Ulnar positive 2-3 mm Volar tilt neutal to +1   Cast applied  Continue aggressive range of motion exercises  X-ray in 2 weeks at postop week #4

## 2020-03-08 NOTE — Patient Instructions (Signed)
Work on the finger motion

## 2020-03-09 ENCOUNTER — Other Ambulatory Visit: Payer: Self-pay | Admitting: Adult Health

## 2020-03-09 DIAGNOSIS — Z1231 Encounter for screening mammogram for malignant neoplasm of breast: Secondary | ICD-10-CM

## 2020-03-20 ENCOUNTER — Other Ambulatory Visit (HOSPITAL_COMMUNITY): Payer: Self-pay | Admitting: Family Medicine

## 2020-03-20 ENCOUNTER — Encounter: Payer: Self-pay | Admitting: Family Medicine

## 2020-03-20 MED ORDER — SUMATRIPTAN SUCCINATE 100 MG PO TABS: 100.0000 mg | ORAL_TABLET | ORAL | 1 refills | Status: DC | PRN

## 2020-03-20 MED FILL — SUMAtriptan SUCCINATE 100 M: 100 | 30 days supply | Qty: 9 | Fill #0

## 2020-03-22 ENCOUNTER — Ambulatory Visit (INDEPENDENT_AMBULATORY_CARE_PROVIDER_SITE_OTHER): Payer: 59 | Admitting: Orthopedic Surgery

## 2020-03-22 ENCOUNTER — Encounter: Payer: Self-pay | Admitting: Orthopedic Surgery

## 2020-03-22 ENCOUNTER — Ambulatory Visit: Payer: 59

## 2020-03-22 ENCOUNTER — Other Ambulatory Visit: Payer: Self-pay

## 2020-03-22 DIAGNOSIS — S52531D Colles' fracture of right radius, subsequent encounter for closed fracture with routine healing: Secondary | ICD-10-CM

## 2020-03-22 NOTE — Progress Notes (Signed)
Chief Complaint  Patient presents with  . Post-op Follow-up    02/24/20 right wrist fracture/ ORIF    4 weeks postop percutaneous pinning closed reduction right wrist  Good pain control good finger movement no swelling  Fracture has little settling 4 mm shortening 15 degrees radial inclination angle 5 degrees volar tilt  Cast off next time and x-ray patient return to work on Monday 28th

## 2020-03-22 NOTE — Patient Instructions (Signed)
RTW MONDAY

## 2020-04-05 ENCOUNTER — Other Ambulatory Visit: Payer: Self-pay

## 2020-04-05 ENCOUNTER — Encounter: Payer: Self-pay | Admitting: Orthopedic Surgery

## 2020-04-05 ENCOUNTER — Ambulatory Visit (INDEPENDENT_AMBULATORY_CARE_PROVIDER_SITE_OTHER): Payer: 59 | Admitting: Orthopedic Surgery

## 2020-04-05 ENCOUNTER — Ambulatory Visit: Payer: 59

## 2020-04-05 DIAGNOSIS — S52531D Colles' fracture of right radius, subsequent encounter for closed fracture with routine healing: Secondary | ICD-10-CM

## 2020-04-05 NOTE — Progress Notes (Signed)
Chief Complaint  Patient presents with  . Post-op Follow-up    02/24/20 right distal radius fracture / closed Manipulation    Encounter Diagnosis  Name Primary?  . Closed Colles' fracture of right radius with routine healing, subsequent encounter Yes    Kylie Duffy is a 65 year old female who works in the [employer] office she is status post closed reduction percutaneous pinning of the right distal radius February 24, 2020  She had a closed reduction of the wrist 17th January and then surgery was delayed due to Covid spike  Her wounds look good her x-ray shows ulnar positive wrist as we did lose some height and slight loss in radial inclination her volar tilt is still +3 4 degrees  Recommend splint Occupational Therapy x-ray in 5 weeks and then determine when we can take the pins out

## 2020-04-24 ENCOUNTER — Other Ambulatory Visit: Payer: Self-pay

## 2020-04-24 ENCOUNTER — Encounter (HOSPITAL_COMMUNITY): Payer: Self-pay | Admitting: Occupational Therapy

## 2020-04-24 ENCOUNTER — Ambulatory Visit (HOSPITAL_COMMUNITY): Payer: 59 | Attending: Orthopedic Surgery | Admitting: Occupational Therapy

## 2020-04-24 DIAGNOSIS — M25631 Stiffness of right wrist, not elsewhere classified: Secondary | ICD-10-CM | POA: Insufficient documentation

## 2020-04-24 DIAGNOSIS — R29898 Other symptoms and signs involving the musculoskeletal system: Secondary | ICD-10-CM | POA: Insufficient documentation

## 2020-04-24 DIAGNOSIS — M25531 Pain in right wrist: Secondary | ICD-10-CM | POA: Insufficient documentation

## 2020-04-24 NOTE — Therapy (Signed)
[facility] [address] [place], [place], [address] Phone: [phone]   Fax:  [phone]  Occupational Therapy Evaluation  Patient Details  Name: Kylie Duffy MRN: [id number] Date of Birth: [birth year] Referring Provider (OT): Dr. Arther Abbott   Encounter Date: 04/24/2020   OT End of Session - 04/24/20 1725    Visit Number 1    Number of Visits 8    Date for OT Re-Evaluation 05/24/20    Authorization Type UMR, $20 copay    Authorization Time Period no visit limit    OT Start Time 1642    OT Stop Time 1720    OT Time Calculation (min) 38 min    Activity Tolerance Patient tolerated treatment well    Behavior During Therapy [facility] for tasks assessed/performed           Past Medical History:  Diagnosis Date  . Burning with urination 11/01/2014  . Hematuria 07/28/2013  . History of UTI   . Hyperlipidemia   . Osteopenia   . PONV (postoperative nausea and vomiting)   . Vaginal dryness 12/03/2012    Past Surgical History:  Procedure Laterality Date  . ABDOMINAL HYSTERECTOMY    . BREAST BIOPSY Left   . CLOSED REDUCTION WRIST FRACTURE Right 02/24/2020   Procedure: CLOSED REDUCTION WRIST with pin placement;  Surgeon: Kylie Civil, MD;  Location: [facility];  Service: Orthopedics;  Laterality: Right;  . COLONOSCOPY N/A 08/25/2016   Procedure: COLONOSCOPY;  Surgeon: Kylie Binder, MD;  Location: [facility];  Service: Endoscopy;  Laterality: N/A;  . DIAGNOSTIC LAPAROSCOPY    . KNEE ARTHROSCOPY WITH MEDIAL MENISECTOMY  01/26/2012   Procedure: KNEE ARTHROSCOPY WITH MEDIAL MENISECTOMY;  Surgeon: Kylie Civil, MD;  Location: [facility];  Service: Orthopedics;  Laterality: Left;  . POLYPECTOMY  08/25/2016   Procedure: POLYPECTOMY;  Surgeon: Kylie Binder, MD;  Location: [facility];  Service: Endoscopy;;  . TONSILLECTOMY      There were no vitals filed for this visit.   Subjective Assessment - 04/24/20 1707    Subjective   S: I take the splint off once a day to exercise.    Pertinent History Pt is a 65 y/o female s/p right distal radius fx sustained after a fall on 02/13/20 with percutaneous pinning on 02/24/20. Pt's surgery was delayed due to covid numbers. Pt has been wearing prefabricated wrist/forearm splint, removes for exercise once per day as well as for bathing. Pt was referred to occupational therapy for evaluation and treatment by Dr. Arther Abbott.    Special Tests DASH: 56.82    Patient Stated Goals To be able to use my hand for my daily tasks.    Currently in Pain? No/denies             [facility] OT Assessment - 04/24/20 1640      Assessment   Medical Diagnosis s/p right radius fx with percutaneous pinning    Referring Provider (OT) Dr. Arther Abbott    Onset Date/Surgical Date 02/13/20   sx date 02/24/20   Hand Dominance Right    Next MD Visit 05/16/2020    Prior Therapy None      Precautions   Precautions None      Balance Screen   Has the patient fallen in the past 6 months Yes    How many times? 1    Has the patient had a decrease in activity level because of a fear of  falling?  No    Is the patient reluctant to leave their home because of a fear of falling?  No      Prior Function   Level of Independence Independent    Vocation Full time employment    Vocation Requirements receptionist-typing, minimal lifting, writing    Leisure exercising, taking care of mother, cooking      ADL   ADL comments Pt is having difficulty with dressing, bathing, washing/brushing/fixing, fixing meals. Driving is uncomfortable      Written Expression   Dominant Hand Right      Cognition   Overall Cognitive Status Within Functional Limits for tasks assessed      Observation/Other Assessments   Quick DASH  56.82      ROM / Strength   AROM / PROM / Strength AROM;PROM;Strength      Palpation   Palpation comment trace fascial restrictions in dorsal and volar forearm      AROM   AROM Assessment  Site Wrist;Forearm    Right/Left Forearm Right    Right Forearm Pronation 90 Degrees    Right Forearm Supination 45 Degrees    Right/Left Wrist Right    Right Wrist Extension 20 Degrees    Right Wrist Flexion 35 Degrees    Right Wrist Radial Deviation 20 Degrees    Right Wrist Ulnar Deviation 18 Degrees      PROM   PROM Assessment Site Wrist;Forearm    Right/Left Forearm Right    Right Forearm Pronation 90 Degrees    Right Forearm Supination 45 Degrees    Right/Left Wrist Right    Right Wrist Extension 35 Degrees    Right Wrist Flexion 42 Degrees    Right Wrist Radial Deviation 20 Degrees    Right Wrist Ulnar Deviation 20 Degrees      Strength   Strength Assessment Site Wrist;Forearm;Hand    Right/Left Forearm Right    Right Forearm Pronation 4-/5    Right Forearm Supination 4-/5    Right/Left Wrist Right    Right Wrist Flexion 3+/5    Right Wrist Extension 4-/5    Right Wrist Radial Deviation 3/5    Right Wrist Ulnar Deviation 3+/5    Right/Left hand Right;Left    Right Hand Gross Grasp Functional    Right Hand Grip (lbs) 10    Right Hand Lateral Pinch 6 lbs    Right Hand 3 Point Pinch 6 lbs    Left Hand Gross Grasp Functional    Left Hand Grip (lbs) 40    Left Hand Lateral Pinch 16 lbs    Left Hand 3 Point Pinch 12 lbs               Kylie Duffy - 04/24/20 1648    Open a tight or new jar Unable    Do heavy household chores (wash walls, wash floors) Unable    Carry a shopping bag or briefcase Severe difficulty    Wash your back Unable    Use a knife to cut food Moderate difficulty    Recreational activities in which you take some force or impact through your arm, shoulder, or hand (golf, hammering, tennis) Unable    During the past week, to what extent has your arm, shoulder or hand problem interfered with your normal social activities with family, friends, neighbors, or groups? Not at all    During the past week, to what extent has your arm, shoulder or hand  problem limited your work or  other regular daily activities Modererately    Arm, shoulder, or hand pain. Mild    Tingling (pins and needles) in your arm, shoulder, or hand None    Difficulty Sleeping Mild difficulty    DASH Score 56.82 %                      OT Education - 04/24/20 1707    Education Details wrist A/ROM, yellow theraputty    Person(s) Educated Patient    Methods Explanation;Demonstration;Handout    Comprehension Verbalized understanding;Returned demonstration            OT Short Term Goals - 04/24/20 1728      OT SHORT TERM GOAL #1   Title Patient will be educated on and independent with HEP to improve mobility required for ADL completion using RUE.    Time 4    Period Weeks    Status New    Target Date 05/24/20      OT SHORT TERM GOAL #2   Title Pt will decrease pain in RUE to 3/10 or less to improve ability to complete cooking tasks with minimal compensatory strategies.    Time 4    Period Weeks    Status New      OT SHORT TERM GOAL #3   Title Pt will increase right wrist A/ROM to [facility] to improve ability to perform dressing and bathing tasks.    Time 4    Period Weeks    Status New      OT SHORT TERM GOAL #4   Title Pt will increase right wrist strength to 4+/5 or greater to improve ability to perform lifting tasks at home and work.    Time 4    Period Weeks    Status New      OT SHORT TERM GOAL #5   Title Pt will increase right grip strength by 20# and pinch strength by 5# to improve ability to grasp and maintain hold on items/tools during functional use.    Time 4    Period Weeks    Status New                    Plan - 04/24/20 1725    Clinical Impression Statement A: Pt is a 65 y/o female s/p right distal radius fracture with percutaneous pinning. Pt reports limited ability to completing functional ADLs and work tasks using dominant RUE. Plan is for pin removal, with date to be determined after next MD visit on 05/16/20.     OT Occupational Profile and History Problem Focused Assessment - Including review of records relating to presenting problem    Occupational performance deficits (Please refer to evaluation for details): ADL's;IADL's;Rest and Sleep;Work;Leisure    Body Structure / Function / Physical Skills ADL;Endurance;UE functional use;Fascial restriction;Pain;ROM;IADL;Strength    Rehab Potential Good    Clinical Decision Making Limited treatment options, no task modification necessary    Comorbidities Affecting Occupational Performance: None    Modification or Assistance to Complete Evaluation  No modification of tasks or assist necessary to complete eval    OT Frequency 2x / week    OT Duration 4 weeks    OT Treatment/Interventions Self-care/ADL training;Ultrasound;Patient/family education;Passive range of motion;Cryotherapy;Electrical Stimulation;Splinting;Moist Heat;Therapeutic exercise;Manual Therapy;Therapeutic activities    Plan P: Pt will benefit from skilled OT services to decrease pain and fascial restrictions, increase joint ROM, strength, and functional use of right hand and wrist. Treatment plan: manual techniques prn, P/ROM,  A/ROM, general wrist strengthening, grip and pinch strengthening, modalities prn    OT Home Exercise Plan eval: wrist A/ROM, yellow theraputty for grip and pinch    Consulted and Agree with Plan of Care Patient           Patient will benefit from skilled therapeutic intervention in order to improve the following deficits and impairments:   Body Structure / Function / Physical Skills: ADL,Endurance,UE functional use,Fascial restriction,Pain,ROM,IADL,Strength       Visit Diagnosis: Pain in right wrist  Stiffness of right wrist, not elsewhere classified  Other symptoms and signs involving the musculoskeletal system    Problem List Patient Active Problem List   Diagnosis Date Noted  . Fracture, Colles, right, closed 02/29/2020  . Closed fracture of right wrist    . Thrombocytosis 07/19/2019  . Well woman exam with routine gynecological exam 02/11/2018  . Screening for colorectal cancer 02/11/2018  . Pituitary abnormality ([place]) 12/26/2016  . Hyperlipidemia 06/30/2016  . Osteopenia 01/04/2014  . Migraines 01/04/2014  . Medial meniscus, posterior horn derangement 01/29/2012  . Chondromalacia of patella, left 01/29/2012   Guadelupe Sabin, OTR/L  [phone] 04/24/2020, 5:31 PM  [facility] [place], [place], [address] Phone: [phone]   Fax:  [phone]  Name: PECOLA HAXTON MRN: [id number] Date of Birth: September 04, 1955

## 2020-04-24 NOTE — Patient Instructions (Signed)
AROM Exercises  Complete exercises ____10-15__ times each, __3_____ times per day  1) Wrist Flexion  Start with wrist at edge of table, palm facing up. With wrist hanging slightly off table, curl wrist upward, and back down.      2) Wrist Extension  Start with wrist at edge of table, palm facing down. With wrist slightly off the edge of the table, curl wrist up and back down.      3) Radial Deviations  Start with forearm flat against a table, wrist hanging slightly off the edge, and palm facing the wall. Bending at the wrist only, and keeping palm facing the wall, bend wrist so fist is pointing towards the floor, back up to start position, and up towards the ceiling. Return to start.        4) WRIST PRONATION  Turn your forearm towards palm face down.  Keep your elbow bent and by the side of your  Body.      5) WRIST SUPINATION  Turn your forearm towards palm face up.  Keep your elbow bent and by the side of your  Body.        Home Exercises Program Theraputty Exercises  Do the following exercises 2-3 times a day using your affected hand.  1. Roll putty into a ball.  2. Make into a pancake.  3. Roll putty into a roll.  4. Pinch along log with first finger and thumb.   5. Make into a ball.  6. Roll it back into a log.   7. Pinch using thumb and side of first finger.  8. Roll into a ball, then flatten into a pancake.  9. Using your fingers, make putty into a mountain.  10. Roll putty back into a ball and squeeze gently for 2-3 minutes.

## 2020-04-25 ENCOUNTER — Encounter: Payer: Self-pay | Admitting: Adult Health

## 2020-04-25 ENCOUNTER — Ambulatory Visit (INDEPENDENT_AMBULATORY_CARE_PROVIDER_SITE_OTHER): Payer: 59 | Admitting: Adult Health

## 2020-04-25 VITALS — BP 130/85 | HR 69 | Ht 62.25 in | Wt 128.0 lb

## 2020-04-25 DIAGNOSIS — Z1211 Encounter for screening for malignant neoplasm of colon: Secondary | ICD-10-CM | POA: Insufficient documentation

## 2020-04-25 DIAGNOSIS — Z01419 Encounter for gynecological examination (general) (routine) without abnormal findings: Secondary | ICD-10-CM | POA: Diagnosis not present

## 2020-04-25 LAB — HEMOCCULT GUIAC POC 1CARD (OFFICE): Fecal Occult Blood, POC: NEGATIVE

## 2020-04-25 NOTE — Progress Notes (Signed)
Patient ID: [name], female   DOB: November 03, 1955, 65 y.o.   MRN: [id number] History of Present Illness: Abygayle is a 65 year old white female,married, sp hysterectomy in for a well woman gyn exam. She fell in January and fractured right wrist, still in brace. She is still working at [employer]. PCP is [facility].    Current Medications, Allergies, Past Medical History, Past Surgical History, Family History and Social History were reviewed in [employer] record.     Review of Systems: Patient denies any headaches, hearing loss, fatigue, blurred vision, shortness of breath, chest pain, abdominal pain, problems with bowel movements, urination, or intercourse. No joint pain or mood swings.    Physical Exam:BP 130/85 (BP Location: Left Arm, Patient Position: Sitting, Cuff Size: Normal)   Pulse 69   Ht 5' 2.25" (1.581 m)   Wt 128 lb (58.1 kg)   BMI 23.22 kg/m  General:  Well developed, well nourished, no acute distress Skin:  Warm and dry Neck:  Midline trachea, normal thyroid, good ROM, no lymphadenopathy,no carotid bruits heard Lungs; Clear to auscultation bilaterally Breast:  No dominant palpable mass, retraction, or nipple discharge,has several AKs, one large one right breast Cardiovascular: Regular rate and rhythm Abdomen:  Soft, non tender, no hepatosplenomegaly Pelvic:  External genitalia is normal in appearance, no lesions.  The vagina is atrophic. Urethra has no lesions or masses. The cervix and uterus are absent. No adnexal masses or tenderness noted.Bladder is non tender, no masses felt. Rectal: Good sphincter tone, no polyps, or hemorrhoids felt.  Hemoccult negative. Extremities/musculoskeletal:  No swelling,+ varicosities noted, and lots of spider veins, no clubbing or cyanosis Psych:  No mood changes, alert and cooperative,seems happy AA is 0 Fall risk is moderate PHQ 9 score is 0 GAD 7 score is 0  Upstream - 04/25/20  1354      Pregnancy Intention Screening   Does the patient want to become pregnant in the next year? N/A    Does the patient's partner want to become pregnant in the next year? N/A    Would the patient like to discuss contraceptive options today? N/A      Contraception Wrap Up   Current Method Female Sterilization   hyst   End Method Female Sterilization   hyst   Contraception Counseling Provided No         Examination chaperoned by Marcie Bal LPN  Impression and Plan: 1. Encounter for well woman exam with routine gynecological exam Physical in 1 year Mammogram in April Labs with PCP Colonoscopy per GI She has gotten 2 COVID vaccines and flu shot   2. Encounter for screening fecal occult blood testing

## 2020-04-26 ENCOUNTER — Other Ambulatory Visit: Payer: Self-pay

## 2020-04-26 ENCOUNTER — Encounter (HOSPITAL_COMMUNITY): Payer: Self-pay | Admitting: Occupational Therapy

## 2020-04-26 ENCOUNTER — Ambulatory Visit (HOSPITAL_COMMUNITY): Payer: 59 | Admitting: Occupational Therapy

## 2020-04-26 DIAGNOSIS — M25631 Stiffness of right wrist, not elsewhere classified: Secondary | ICD-10-CM | POA: Diagnosis not present

## 2020-04-26 DIAGNOSIS — M25531 Pain in right wrist: Secondary | ICD-10-CM

## 2020-04-26 DIAGNOSIS — R29898 Other symptoms and signs involving the musculoskeletal system: Secondary | ICD-10-CM

## 2020-04-26 NOTE — Therapy (Signed)
[facility] [address] [place], [place], [address] Phone: [phone]   Fax:  [phone]  Occupational Therapy Treatment  Patient Details  Name: Kylie Duffy MRN: [id number] Date of Birth: [birth year] Referring Provider (OT): Dr. Arther Abbott   Encounter Date: 04/26/2020   OT End of Session - 04/26/20 0826    Visit Number 2    Number of Visits 8    Date for OT Re-Evaluation 05/24/20    Authorization Type UMR, $20 copay    Authorization Time Period no visit limit    OT Start Time 0735    OT Stop Time 0813    OT Time Calculation (min) 38 min    Activity Tolerance Patient tolerated treatment well    Behavior During Therapy [facility] for tasks assessed/performed           Past Medical History:  Diagnosis Date  . Burning with urination 11/01/2014  . Hematuria 07/28/2013  . History of UTI   . Hyperlipidemia   . Osteopenia   . PONV (postoperative nausea and vomiting)   . Vaginal dryness 12/03/2012    Past Surgical History:  Procedure Laterality Date  . ABDOMINAL HYSTERECTOMY    . BREAST BIOPSY Left   . CLOSED REDUCTION WRIST FRACTURE Right 02/24/2020   Procedure: CLOSED REDUCTION WRIST with pin placement;  Surgeon: Carole Civil, MD;  Location: [facility];  Service: Orthopedics;  Laterality: Right;  . COLONOSCOPY N/A 08/25/2016   Procedure: COLONOSCOPY;  Surgeon: Danie Binder, MD;  Location[facility];  Service: Endoscopy;  Laterality: N/A;  . DIAGNOSTIC LAPAROSCOPY    . KNEE ARTHROSCOPY WITH MEDIAL MENISECTOMY  01/26/2012   Procedure: KNEE ARTHROSCOPY WITH MEDIAL MENISECTOMY;  Surgeon: Carole Civil, MD;  Location: [facility];  Service: Orthopedics;  Laterality: Left;  . POLYPECTOMY  08/25/2016   Procedure: POLYPECTOMY;  Surgeon: Danie Binder, MD;  Location: [facility];  Service: Endoscopy;;  . TONSILLECTOMY      There were no vitals filed for this visit.   Subjective Assessment - 04/26/20 0735    Subjective   S: I haven't done the exercises 3 times yet.    Currently in Pain? No/denies              [facility] OT Assessment - 04/26/20 0735      Assessment   Medical Diagnosis s/p right radius fx with percutaneous pinning      Precautions   Precautions None                    OT Treatments/Exercises (OP) - 04/26/20 0738      Exercises   Exercises Wrist;Hand      Wrist Exercises   Wrist Flexion PROM;AROM;10 reps    Wrist Extension PROM;AROM;10 reps    Wrist Radial Deviation PROM;AROM;10 reps    Wrist Ulnar Deviation PROM;AROM;10 reps    Other wrist exercises forearm supination/pronation, P/ROM, A/ROM, 10X      Additional Wrist Exercises   Sponges 8, 10, 10      Hand Exercises   Digit Abduction/Adduction 5X, A/ROM    Other Hand Exercises Pt stacking high resistance sponges while keeping forearm on the table and using active wrist extension. Pt stacking piles 3 sponges high, completing 5 stacks. Pt then turning forearm to neutral position and unstacking while working on active wrist flexion.      Manual Therapy   Manual Therapy Soft tissue mobilization    Manual therapy  comments completed separately from therapeutic exercises    Soft tissue mobilization myofascial release to dorsal hand, wrist, and forearm to decrease fascial restrictions and muscle tightness, decrease pain, and increase joint ROM      Fine Motor Coordination (Hand/Wrist)   Fine Motor Coordination In hand manipuation training    In Hand Manipulation Training Pt holding 5 sequence chips in palm and working on palm to fingertip translation and natural wrist motion.                    OT Short Term Goals - 04/26/20 1601      OT SHORT TERM GOAL #1   Title Patient will be educated on and independent with HEP to improve mobility required for ADL completion using RUE.    Time 4    Period Weeks    Status On-going    Target Date 05/24/20      OT SHORT TERM GOAL #2   Title Pt will decrease pain in  RUE to 3/10 or less to improve ability to complete cooking tasks with minimal compensatory strategies.    Time 4    Period Weeks    Status On-going      OT SHORT TERM GOAL #3   Title Pt will increase right wrist A/ROM to [facility] to improve ability to perform dressing and bathing tasks.    Time 4    Period Weeks    Status On-going      OT SHORT TERM GOAL #4   Title Pt will increase right wrist strength to 4+/5 or greater to improve ability to perform lifting tasks at home and work.    Time 4    Period Weeks    Status On-going      OT SHORT TERM GOAL #5   Title Pt will increase right grip strength by 20# and pinch strength by 5# to improve ability to grasp and maintain hold on items/tools during functional use.    Time 4    Period Weeks    Status On-going                    Plan - 04/26/20 0932    Clinical Impression Statement A: Initiated myofascial release to address muscle tightness and pain along dorsal wrist and hand. Completed gentle passive stretching, A/ROM, gentle grip tasks, and fine motor tasks involving natural wrist mobility. Pt completing with minimal pain, educated on completing exercises until stretch is felt, not pushing into higher pain levels. Verbal cuing for form and technique.    Body Structure / Function / Physical Skills ADL;Endurance;UE functional use;Fascial restriction;Pain;ROM;IADL;Strength    Plan P: Continue with manual techniques as needed, gentle passive stretching, A/ROM. Towel crumple for gentle gripping task    OT Home Exercise Plan eval: wrist A/ROM, yellow theraputty for grip and pinch    Consulted and Agree with Plan of Care Patient           Patient will benefit from skilled therapeutic intervention in order to improve the following deficits and impairments:   Body Structure / Function / Physical Skills: ADL,Endurance,UE functional use,Fascial restriction,Pain,ROM,IADL,Strength       Visit Diagnosis: Pain in right  wrist  Stiffness of right wrist, not elsewhere classified  Other symptoms and signs involving the musculoskeletal system    Problem List Patient Active Problem List   Diagnosis Date Noted  . Encounter for screening fecal occult blood testing 04/25/2020  . Encounter for well woman exam with  routine gynecological exam 04/25/2020  . Fracture, Colles, right, closed 02/29/2020  . Closed fracture of right wrist   . Thrombocytosis 07/19/2019  . Well woman exam with routine gynecological exam 02/11/2018  . Screening for colorectal cancer 02/11/2018  . Pituitary abnormality ([place]) 12/26/2016  . Hyperlipidemia 06/30/2016  . Osteopenia 01/04/2014  . Migraines 01/04/2014  . Medial meniscus, posterior horn derangement 01/29/2012  . Chondromalacia of patella, left 01/29/2012   Guadelupe Sabin, OTR/L  [phone] 04/26/2020, 8:29 AM  [place], [place], [address] Phone: [phone]   Fax:  [phone]  Name: DREYAH MONTROSE MRN: [id number] Date of Birth: [birth year]

## 2020-05-01 ENCOUNTER — Other Ambulatory Visit: Payer: Self-pay

## 2020-05-01 ENCOUNTER — Ambulatory Visit (HOSPITAL_COMMUNITY): Payer: 59 | Attending: Orthopedic Surgery | Admitting: Specialist

## 2020-05-01 ENCOUNTER — Encounter (HOSPITAL_COMMUNITY): Payer: Self-pay | Admitting: Specialist

## 2020-05-01 DIAGNOSIS — M25631 Stiffness of right wrist, not elsewhere classified: Secondary | ICD-10-CM

## 2020-05-01 DIAGNOSIS — R600 Localized edema: Secondary | ICD-10-CM

## 2020-05-01 DIAGNOSIS — M25531 Pain in right wrist: Secondary | ICD-10-CM | POA: Diagnosis not present

## 2020-05-01 DIAGNOSIS — R29898 Other symptoms and signs involving the musculoskeletal system: Secondary | ICD-10-CM | POA: Diagnosis not present

## 2020-05-01 DIAGNOSIS — M25641 Stiffness of right hand, not elsewhere classified: Secondary | ICD-10-CM

## 2020-05-01 NOTE — Therapy (Signed)
[facility] [address] [place], [place], [address] Phone: [phone]   Fax:  [phone]  Occupational Therapy Treatment  Patient Details  Name: Kylie Duffy MRN: [id number] Date of Birth: [birth year] Referring Provider (OT): Dr. Arther Abbott   Encounter Date: 05/01/2020   OT End of Session - 05/01/20 1620    Visit Number 3    Number of Visits 8    Date for OT Re-Evaluation 05/24/20    Authorization Type UMR, $20 copay    Authorization Time Period no visit limit    OT Start Time 1528    OT Stop Time 1606    OT Time Calculation (min) 38 min    Activity Tolerance Patient tolerated treatment well    Behavior During Therapy [facility] for tasks assessed/performed           Past Medical History:  Diagnosis Date  . Burning with urination 11/01/2014  . Hematuria 07/28/2013  . History of UTI   . Hyperlipidemia   . Osteopenia   . PONV (postoperative nausea and vomiting)   . Vaginal dryness 12/03/2012    Past Surgical History:  Procedure Laterality Date  . ABDOMINAL HYSTERECTOMY    . BREAST BIOPSY Left   . CLOSED REDUCTION WRIST FRACTURE Right 02/24/2020   Procedure: CLOSED REDUCTION WRIST with pin placement;  Surgeon: Carole Civil, MD;  Location[facility];  Service: Orthopedics;  Laterality: Right;  . COLONOSCOPY N/A 08/25/2016   Procedure: COLONOSCOPY;  Surgeon: Danie Binder, MD;  [facility];  Service: Endoscopy;  Laterality: N/A;  . DIAGNOSTIC LAPAROSCOPY    . KNEE ARTHROSCOPY WITH MEDIAL MENISECTOMY  01/26/2012   Procedure: KNEE ARTHROSCOPY WITH MEDIAL MENISECTOMY;  Surgeon: Carole Civil, MD;  Location: [facility];  Service: Orthopedics;  Laterality: Left;  . POLYPECTOMY  08/25/2016   Procedure: POLYPECTOMY;  Surgeon: Danie Binder, MD;  [facility];  Service: Endoscopy;;  . TONSILLECTOMY      There were no vitals filed for this visit.   Subjective Assessment - 05/01/20 1620    Subjective  S:   I'm using it more at work which I think is helping.    Currently in Pain? Yes    Pain Score 1     Pain Location Wrist    Pain Orientation Right    Pain Descriptors / Indicators Aching    Pain Type Acute pain              [facility] OT Assessment - 05/01/20 0001      Assessment   Medical Diagnosis s/p right radius fx with percutaneous pinning    Referring Provider (OT) Dr. Arther Abbott      Precautions   Precautions None                    OT Treatments/Exercises (OP) - 05/01/20 0001      Exercises   Exercises Wrist;Hand;Theraputty      Wrist Exercises   Other wrist exercises forearm supination/pronation, P/ROM, A/ROM, 10X    Other wrist exercises wrist flexion, extension, radial deviation, ulnar deviation 10 times active and passive each      Additional Wrist Exercises   Sponges sponges for grip and in hand manipulation 12, 13, 15      Hand Exercises   Other Hand Exercises Pt stacking high resistance sponges while keeping forearm on the table and using active wrist extension. Pt stacking piles 3 sponges high, completing  5 stacks. Pt then turning forearm to neutral position and unstacking while working on active wrist flexion.    Other Hand Exercises OT held bank in front of patient and patient picked up pennies and placed pennies in vertical slot (like a vending machine) for improved digit mobility and wrist extension.  min cuing to pick up pennies with thumb and index finger versus between index and long finger.      Theraputty   Theraputty - Roll small piece for HEP yellow    Theraputty - Grip smaill piece for HEP yellow    Theraputty - Pinch small piece for HEP, 2 point pinch with index/thumb and long/thumb    Theraputty Hand- Locate Pegs located 10 beads in putty      Manual Therapy   Manual Therapy Myofascial release    Manual therapy comments completed separately from therapeutic exercises    Myofascial Release myofascial release to dorsal hand, wrist,  and forearm to decrease fascial restrictions and muscle tightness, decrease pain, and increase joint ROM      Fine Motor Coordination (Hand/Wrist)   Fine Motor Coordination Tendon glides    Tendon Glides 10 times with flat fist most difficult to achieve                    OT Short Term Goals - 04/26/20 9211      OT SHORT TERM GOAL #1   Title Patient will be educated on and independent with HEP to improve mobility required for ADL completion using RUE.    Time 4    Period Weeks    Status On-going    Target Date 05/24/20      OT SHORT TERM GOAL #2   Title Pt will decrease pain in RUE to 3/10 or less to improve ability to complete cooking tasks with minimal compensatory strategies.    Time 4    Period Weeks    Status On-going      OT SHORT TERM GOAL #3   Title Pt will increase right wrist A/ROM to [facility] to improve ability to perform dressing and bathing tasks.    Time 4    Period Weeks    Status On-going      OT SHORT TERM GOAL #4   Title Pt will increase right wrist strength to 4+/5 or greater to improve ability to perform lifting tasks at home and work.    Time 4    Period Weeks    Status On-going      OT SHORT TERM GOAL #5   Title Pt will increase right grip strength by 20# and pinch strength by 5# to improve ability to grasp and maintain hold on items/tools during functional use.    Time 4    Period Weeks    Status On-going                    Plan - 05/01/20 1621    Clinical Impression Statement A: continued with manual therapy to right flexor and extensor forearm, wrist, hand to improve mobility needed for functional activities.  added tendon glides, which patient had difficulty completing flat fist portion.  focused on therapeutic activities for improved wrist extension.    Body Structure / Function / Physical Skills ADL;Endurance;UE functional use;Fascial restriction;Pain;ROM;IADL;Strength    Plan P:  continue gentle passive range, active range, add  peg board on slant for improved wrist extension.           Patient will  benefit from skilled therapeutic intervention in order to improve the following deficits and impairments:   Body Structure / Function / Physical Skills: ADL,Endurance,UE functional use,Fascial restriction,Pain,ROM,IADL,Strength       Visit Diagnosis: Stiffness of right wrist, not elsewhere classified  Other symptoms and signs involving the musculoskeletal system  Pain in right wrist  Localized edema  Stiffness of right hand, not elsewhere classified    Problem List Patient Active Problem List   Diagnosis Date Noted  . Encounter for screening fecal occult blood testing 04/25/2020  . Encounter for well woman exam with routine gynecological exam 04/25/2020  . Fracture, Col[place], right, closed 02/29/2020  . Closed fracture of right wrist   . Thrombocytosis 07/19/2019  . Well woman exam with routine gynecological exam 02/11/2018  . Screening for colorectal cancer 02/11/2018  . Pituitary abnormality ([place]) 12/26/2016  . Hyperlipidemia 06/30/2016  . Osteopenia 01/04/2014  . Migraines 01/04/2014  . Medial meniscus, posterior horn derangement 01/29/2012  . Chondromalacia of patella, left 01/29/2012    Vangie Bicker, [place], OTR/L [phone]  05/01/2020, 4:29 PM  [place], [place], [address] Phone: [phone]   Fax:  [phone]  Name: Kylie Duffy MRN: [id number] Date of Birth: Sep 09, 1955

## 2020-05-02 ENCOUNTER — Ambulatory Visit
Admission: RE | Admit: 2020-05-02 | Discharge: 2020-05-02 | Disposition: A | Discharge status: Home / Self Care | Payer: 59 | Source: Ambulatory Visit | Attending: Adult Health | Admitting: Adult Health

## 2020-05-02 ENCOUNTER — Encounter (HOSPITAL_COMMUNITY): Payer: Self-pay

## 2020-05-02 ENCOUNTER — Ambulatory Visit (HOSPITAL_COMMUNITY): Payer: 59

## 2020-05-02 DIAGNOSIS — M25641 Stiffness of right hand, not elsewhere classified: Secondary | ICD-10-CM | POA: Diagnosis not present

## 2020-05-02 DIAGNOSIS — R600 Localized edema: Secondary | ICD-10-CM | POA: Diagnosis not present

## 2020-05-02 DIAGNOSIS — R29898 Other symptoms and signs involving the musculoskeletal system: Secondary | ICD-10-CM | POA: Diagnosis not present

## 2020-05-02 DIAGNOSIS — M25531 Pain in right wrist: Secondary | ICD-10-CM

## 2020-05-02 DIAGNOSIS — M25631 Stiffness of right wrist, not elsewhere classified: Secondary | ICD-10-CM

## 2020-05-02 DIAGNOSIS — Z1231 Encounter for screening mammogram for malignant neoplasm of breast: Secondary | ICD-10-CM | POA: Diagnosis not present

## 2020-05-02 NOTE — Patient Instructions (Addendum)
Complete the following stretching daily as often as you think about them. Hold each one for 20-30 seconds. Complete 3 times.    PRAYER STRETCH - WRIST  Place the palms of your hands together with your fingers pointed upwards. Then lower your hands in front of your chest as shown to stretch your wrists.     WRIST SUPINATION STRETCH  Rest your arm on a table, then grasp your wrist as shown and gently turn your affected wrist towards palm face-up.   Keep your elbow bent at your side and forearm resting on a table during this stretch.      WRIST EXTENSOR STRETCH  Use your unaffected hand to bend the affected wrist down as shown.   Keep the elbow straight on the affected side the entire time.      You have two different options for the wrist flexion stretch:   WRIST FLEXOR STRETCH  Use your unaffected hand to bend the affected wrist up as shown.   Keep the elbow straight on the affected side the entire time.       OR   WRIST EXTENSION STRETCH - TABLE  Place both hands on a table as shown and gently lean forward until a stretch is felt.

## 2020-05-02 NOTE — Therapy (Signed)
[facility] [address] [place], [place], [address] Phone: [phone]   Fax:  [phone]  Occupational Therapy Treatment  Patient Details  Name: Kylie Duffy MRN: [id number] Date of Birth: 06-11-1955 Referring Provider (OT): Dr. Arther Abbott   Encounter Date: 05/02/2020   OT End of Session - 05/02/20 1043    Visit Number 4    Number of Visits 8    Date for OT Re-Evaluation 05/24/20    Authorization Type UMR, $20 copay    Authorization Time Period no visit limit    OT Start Time 0950    OT Stop Time 1025    OT Time Calculation (min) 35 min    Activity Tolerance Patient tolerated treatment well    Behavior During Therapy [facility] for tasks assessed/performed           Past Medical History:  Diagnosis Date  . Burning with urination 11/01/2014  . Hematuria 07/28/2013  . History of UTI   . Hyperlipidemia   . Osteopenia   . PONV (postoperative nausea and vomiting)   . Vaginal dryness 12/03/2012    Past Surgical History:  Procedure Laterality Date  . ABDOMINAL HYSTERECTOMY    . BREAST BIOPSY Left   . CLOSED REDUCTION WRIST FRACTURE Right 02/24/2020   Procedure: CLOSED REDUCTION WRIST with pin placement;  Surgeon: Carole Civil, MD;  Location: [facility];  Service: Orthopedics;  Laterality: Right;  . COLONOSCOPY N/A 08/25/2016   Procedure: COLONOSCOPY;  Surgeon: Danie Binder, MD;  [facility];  Service: Endoscopy;  Laterality: N/A;  . DIAGNOSTIC LAPAROSCOPY    . KNEE ARTHROSCOPY WITH MEDIAL MENISECTOMY  01/26/2012   Procedure: KNEE ARTHROSCOPY WITH MEDIAL MENISECTOMY;  Surgeon: Carole Civil, MD;  Location: [facility];  Service: Orthopedics;  Laterality: Left;  . POLYPECTOMY  08/25/2016   Procedure: POLYPECTOMY;  Surgeon: Danie Binder, MD;  [facility];  Service: Endoscopy;;  . TONSILLECTOMY      There were no vitals filed for this visit.   Subjective Assessment - 05/02/20 1017    Subjective  S:  I did a few new things yesterday during my session so my arm is a little tired.    Currently in Pain? No/denies              [facility] OT Assessment - 05/02/20 1018      Assessment   Medical Diagnosis s/p right radius fx with percutaneous pinning      Precautions   Precautions None                    OT Treatments/Exercises (OP) - 05/02/20 1018      Exercises   Exercises Wrist;Hand;Theraputty      Wrist Exercises   Other wrist exercises forearm supination/pronation, P/ROM, A/ROM, 10X    Other wrist exercises wrist flexion, extension, radial deviation, ulnar deviation 10 times active and passive each      Additional Wrist Exercises   Sponges 12, 15      Hand Exercises   Other Hand Exercises focusing on wrist extension with forearm on table, patient stacked high resistance sponges 2 high. Completed 2 times.      Neurological Re-education Exercises   Other Exercises 1 Using 2in binder for incline and focus on wrist extension, patient completed colored pegboard pattern while keeping forearm resting on table.      Manual Therapy   Manual Therapy Myofascial release    Manual  therapy comments completed separately from therapeutic exercises    Myofascial Release myofascial release to dorsal hand, wrist, and forearm to decrease fascial restrictions and muscle tightness, decrease pain, and increase joint ROM                  OT Education - 05/02/20 1016    Education Details wrist stretches provided    Person(s) Educated Patient    Methods Explanation;Demonstration;Handout;Verbal cues    Comprehension Verbalized understanding;Returned demonstration            OT Short Term Goals - 04/26/20 0828      OT SHORT TERM GOAL #1   Title Patient will be educated on and independent with HEP to improve mobility required for ADL completion using RUE.    Time 4    Period Weeks    Status On-going    Target Date 05/24/20      OT SHORT TERM GOAL #2   Title Pt will  decrease pain in RUE to 3/10 or less to improve ability to complete cooking tasks with minimal compensatory strategies.    Time 4    Period Weeks    Status On-going      OT SHORT TERM GOAL #3   Title Pt will increase right wrist A/ROM to [facility] to improve ability to perform dressing and bathing tasks.    Time 4    Period Weeks    Status On-going      OT SHORT TERM GOAL #4   Title Pt will increase right wrist strength to 4+/5 or greater to improve ability to perform lifting tasks at home and work.    Time 4    Period Weeks    Status On-going      OT SHORT TERM GOAL #5   Title Pt will increase right grip strength by 20# and pinch strength by 5# to improve ability to grasp and maintain hold on items/tools during functional use.    Time 4    Period Weeks    Status On-going                    Plan - 05/02/20 1043    Clinical Impression Statement A: Provided wrist stretches to HEP and reviewed during session. Discussed taking breaks as needed when at work, home, or therapy sessions and perform a stretch as needed versus only completing HEP at a specific time. Recommend stretching throughout the day when needed. Completed activity on 2inch binder for incline and focused on wrist extension. patient made adjustments throughout activity as needed to remain within pain free/comfort zone. No fascial restrictions noted during manual techniques this date so focused on gentle passive ROM and A/ROM during session.    Body Structure / Function / Physical Skills ADL;Endurance;UE functional use;Fascial restriction;Pain;ROM;IADL;Strength    Plan P: Follow up on HEP. Continue to work on increasing A/ROM during wrist flexion/extension and supination. Try wrist flexion/extension bar without weight to work just on wrist extension when brining strap up and wrist flexion when bringing strap down to floor.    OT Home Exercise Plan eval: wrist A/ROM, yellow theraputty for grip and pinch 4/6: Wrist stretches     Consulted and Agree with Plan of Care Patient           Patient will benefit from skilled therapeutic intervention in order to improve the following deficits and impairments:   Body Structure / Function / Physical Skills: ADL,Endurance,UE functional use,Fascial restriction,Pain,ROM,IADL,Strength  Visit Diagnosis: Stiffness of right wrist, not elsewhere classified  Other symptoms and signs involving the musculoskeletal system  Pain in right wrist  Stiffness of right hand, not elsewhere classified    Problem List Patient Active Problem List   Diagnosis Date Noted  . Encounter for screening fecal occult blood testing 04/25/2020  . Encounter for well woman exam with routine gynecological exam 04/25/2020  . Fracture, [place], right, closed 02/29/2020  . Closed fracture of right wrist   . Thrombocytosis 07/19/2019  . Well woman exam with routine gynecological exam 02/11/2018  . Screening for colorectal cancer 02/11/2018  . Pituitary abnormality ([place]) 12/26/2016  . Hyperlipidemia 06/30/2016  . Osteopenia 01/04/2014  . Migraines 01/04/2014  . Medial meniscus, posterior horn derangement 01/29/2012  . Chondromalacia of patella, left 01/29/2012   Ailene Ravel, OTR/L,CBIS  [phone]  05/02/2020, 11:16 AM  [place], [place], [address] Phone: [phone]   Fax:  [phone]  Name: Kylie Duffy MRN: [id number] Date of Birth: Jul 16, 1955

## 2020-05-08 ENCOUNTER — Ambulatory Visit (HOSPITAL_COMMUNITY): Payer: 59 | Admitting: Specialist

## 2020-05-08 ENCOUNTER — Encounter (HOSPITAL_COMMUNITY): Payer: Self-pay | Admitting: Specialist

## 2020-05-08 ENCOUNTER — Other Ambulatory Visit: Payer: Self-pay

## 2020-05-08 DIAGNOSIS — M25531 Pain in right wrist: Secondary | ICD-10-CM

## 2020-05-08 DIAGNOSIS — M25641 Stiffness of right hand, not elsewhere classified: Secondary | ICD-10-CM | POA: Diagnosis not present

## 2020-05-08 DIAGNOSIS — R29898 Other symptoms and signs involving the musculoskeletal system: Secondary | ICD-10-CM

## 2020-05-08 DIAGNOSIS — M25631 Stiffness of right wrist, not elsewhere classified: Secondary | ICD-10-CM

## 2020-05-08 DIAGNOSIS — R600 Localized edema: Secondary | ICD-10-CM | POA: Diagnosis not present

## 2020-05-08 NOTE — Therapy (Signed)
Kylie Duffy, [place], [address] Phone: [phone]   Fax:  [phone]  Occupational Therapy Treatment  Patient Details  Name: Kylie Duffy MRN: [id number] Date of Birth: Jun 10, 1955 Referring Provider (OT): Dr. Arther Abbott   Encounter Date: 05/08/2020   OT End of Session - 05/08/20 1525    Visit Number 5    Number of Visits 8    Date for OT Re-Evaluation 05/24/20    Authorization Type UMR, $20 copay    Authorization Time Period no visit limit    OT Start Time 1345    OT Stop Time 1428    OT Time Calculation (min) 43 min    Activity Tolerance Patient tolerated treatment well    Behavior During Therapy [facility] for tasks assessed/performed           Past Medical History:  Diagnosis Date  . Burning with urination 11/01/2014  . Hematuria 07/28/2013  . History of UTI   . Hyperlipidemia   . Osteopenia   . PONV (postoperative nausea and vomiting)   . Vaginal dryness 12/03/2012    Past Surgical History:  Procedure Laterality Date  . ABDOMINAL HYSTERECTOMY    . BREAST BIOPSY Left   . CLOSED REDUCTION WRIST FRACTURE Right 02/24/2020   Procedure: CLOSED REDUCTION WRIST with pin placement;  Surgeon: Carole Civil, MD;  Location: [facility];  Service: Orthopedics;  Laterality: Right;  . COLONOSCOPY N/A 08/25/2016   Procedure: COLONOSCOPY;  Surgeon: Danie Binder, MD;  [facility];  Service: Endoscopy;  Laterality: N/A;  . DIAGNOSTIC LAPAROSCOPY    . KNEE ARTHROSCOPY WITH MEDIAL MENISECTOMY  01/26/2012   Procedure: KNEE ARTHROSCOPY WITH MEDIAL MENISECTOMY;  Surgeon: Carole Civil, MD;  Location: [facility];  Service: Orthopedics;  Laterality: Left;  . POLYPECTOMY  08/25/2016   Procedure: POLYPECTOMY;  Surgeon: Danie Binder, MD;  [facility];  Service: Endoscopy;;  . TONSILLECTOMY      There were no vitals filed for this visit.   Subjective Assessment - 05/08/20 1525    Subjective   S:  I think I may have over did it yesterday.  I lifted some reams of paper.    Currently in Pain? Yes    Pain Score 1     Pain Location Wrist    Pain Orientation Right    Pain Descriptors / Indicators Aching              [facility] OT Assessment - 05/08/20 0001      Assessment   Medical Diagnosis s/p right radius fx with percutaneous pinning    Referring Provider (OT) Dr. Arther Abbott      Precautions   Precautions None                    OT Treatments/Exercises (OP) - 05/08/20 0001      Exercises   Exercises Elbow;Wrist;Hand;Theraputty      Additional Elbow Exercises   Theraputty - Flatten with yellow putty completed wrist stability pull with PVC pipe X 5 with max difficulty    Theraputty - Roll yellow with focus on wrist extension    Theraputty - Grip yellow with focus on wrist extension      Wrist Exercises   Other wrist exercises forearm supination/pronation, P/ROM, A/ROM, 10X    Other wrist exercises wrist flexion, extension, radial deviation, ulnar deviation 10 times active and passive each      Hand  Exercises   Other Hand Exercises used stick with strap on it and with alternating wrist extnesion/flexion rolled strap up and then un rolled x 2 sets with moderate difficulty mobilizing her right wrist    Other Hand Exercises in hand manipulation with small beads, able to pick up 10 beads one by one and translate to her palm with min dififculty.      Theraputty   Theraputty Hand- Locate Pegs located 10 beads in putty      Manual Therapy   Manual Therapy Myofascial release    Manual therapy comments completed separately from therapeutic exercises    Myofascial Release myofascial release to dorsal hand, wrist, and forearm to decrease fascial restrictions and muscle tightness, decrease pain, and increase joint ROM                    OT Short Term Goals - 04/26/20 2751      OT SHORT TERM GOAL #1   Title Patient will be educated on and  independent with HEP to improve mobility required for ADL completion using RUE.    Time 4    Period Weeks    Status On-going    Target Date 05/24/20      OT SHORT TERM GOAL #2   Title Pt will decrease pain in RUE to 3/10 or less to improve ability to complete cooking tasks with minimal compensatory strategies.    Time 4    Period Weeks    Status On-going      OT SHORT TERM GOAL #3   Title Pt will increase right wrist A/ROM to [facility] to improve ability to perform dressing and bathing tasks.    Time 4    Period Weeks    Status On-going      OT SHORT TERM GOAL #4   Title Pt will increase right wrist strength to 4+/5 or greater to improve ability to perform lifting tasks at home and work.    Time 4    Period Weeks    Status On-going      OT SHORT TERM GOAL #5   Title Pt will increase right grip strength by 20# and pinch strength by 5# to improve ability to grasp and maintain hold on items/tools during functional use.    Time 4    Period Weeks    Status On-going                    Plan - 05/08/20 1529    Clinical Impression Statement A:  patient demonstrating improved range, fluidity of movement, and wrist extension with functional activities with less cuing/concentration to do so, however, continues to lack full range of motion for both flexion and extension.    Body Structure / Function / Physical Skills ADL;Endurance;UE functional use;Fascial restriction;Pain;ROM;IADL;Strength    Plan P:  continue working on improving a/rom of wrist and forearm to [facility], continue use of flexion/extension bar without weight and less cuing for positioning of arm.           Patient will benefit from skilled therapeutic intervention in order to improve the following deficits and impairments:   Body Structure / Function / Physical Skills: ADL,Endurance,UE functional use,Fascial restriction,Pain,ROM,IADL,Strength       Visit Diagnosis: Stiffness of right wrist, not elsewhere  classified  Other symptoms and signs involving the musculoskeletal system  Pain in right wrist  Stiffness of right hand, not elsewhere classified  Localized edema    Problem List Patient  Active Problem List   Diagnosis Date Noted  . Encounter for screening fecal occult blood testing 04/25/2020  . Encounter for well woman exam with routine gynecological exam 04/25/2020  . Fracture, [place], right, closed 02/29/2020  . Closed fracture of right wrist   . Thrombocytosis 07/19/2019  . Well woman exam with routine gynecological exam 02/11/2018  . Screening for colorectal cancer 02/11/2018  . Pituitary abnormality ([place]) 12/26/2016  . Hyperlipidemia 06/30/2016  . Osteopenia 01/04/2014  . Migraines 01/04/2014  . Medial meniscus, posterior horn derangement 01/29/2012  . Chondromalacia of patella, left 01/29/2012    Vangie Bicker, Bryce, OTR/L [phone]  05/08/2020, 3:35 PM  [place], [place], [address] Phone: [phone]   Fax:  [phone]  Name: TALIA HOHEISEL MRN: [id number] Date of Birth: Oct 08, 1955

## 2020-05-11 ENCOUNTER — Ambulatory Visit (HOSPITAL_COMMUNITY): Payer: 59

## 2020-05-11 ENCOUNTER — Encounter (HOSPITAL_COMMUNITY): Payer: Self-pay

## 2020-05-11 ENCOUNTER — Other Ambulatory Visit: Payer: Self-pay

## 2020-05-11 DIAGNOSIS — M25641 Stiffness of right hand, not elsewhere classified: Secondary | ICD-10-CM

## 2020-05-11 DIAGNOSIS — R600 Localized edema: Secondary | ICD-10-CM | POA: Diagnosis not present

## 2020-05-11 DIAGNOSIS — M25631 Stiffness of right wrist, not elsewhere classified: Secondary | ICD-10-CM

## 2020-05-11 DIAGNOSIS — R29898 Other symptoms and signs involving the musculoskeletal system: Secondary | ICD-10-CM

## 2020-05-11 DIAGNOSIS — M25531 Pain in right wrist: Secondary | ICD-10-CM | POA: Diagnosis not present

## 2020-05-11 NOTE — Therapy (Signed)
[facility] [address] [place], [place], [address] Phone: [phone]   Fax:  [phone]  Occupational Therapy Treatment  Patient Details  Name: Kylie Duffy MRN: [id number] Date of Birth: 1955/04/23 Referring Provider (OT): Dr. Arther Abbott   Encounter Date: 05/11/2020   OT End of Session - 05/11/20 0922    Visit Number 6    Number of Visits 8    Date for OT Re-Evaluation 05/24/20    Authorization Type UMR, $20 copay    Authorization Time Period no visit limit    OT Start Time 0908   pt arrived late   OT Stop Time 0938    OT Time Calculation (min) 30 min    Activity Tolerance Patient tolerated treatment well    Behavior During Therapy [facility] for tasks assessed/performed           Past Medical History:  Diagnosis Date  . Burning with urination 11/01/2014  . Hematuria 07/28/2013  . History of UTI   . Hyperlipidemia   . Osteopenia   . PONV (postoperative nausea and vomiting)   . Vaginal dryness 12/03/2012    Past Surgical History:  Procedure Laterality Date  . ABDOMINAL HYSTERECTOMY    . BREAST BIOPSY Left   . CLOSED REDUCTION WRIST FRACTURE Right 02/24/2020   Procedure: CLOSED REDUCTION WRIST with pin placement;  Surgeon: Carole Civil, MD;  Location: [facility];  Service: Orthopedics;  Laterality: Right;  . COLONOSCOPY N/A 08/25/2016   Procedure: COLONOSCOPY;  Surgeon: Danie Binder, MD;  [facility];  Service: Endoscopy;  Laterality: N/A;  . DIAGNOSTIC LAPAROSCOPY    . KNEE ARTHROSCOPY WITH MEDIAL MENISECTOMY  01/26/2012   Procedure: KNEE ARTHROSCOPY WITH MEDIAL MENISECTOMY;  Surgeon: Carole Civil, MD;  Location: [facility];  Service: Orthopedics;  Laterality: Left;  . POLYPECTOMY  08/25/2016   Procedure: POLYPECTOMY;  Surgeon: Danie Binder, MD;  [facility];  Service: Endoscopy;;  . TONSILLECTOMY      There were no vitals filed for this visit.   Subjective Assessment - 05/11/20 0920     Subjective  S: I see the Doctor on Wednesday.    Currently in Pain? No/denies              [facility] OT Assessment - 05/11/20 0927      Assessment   Medical Diagnosis s/p right radius fx with percutaneous pinning      Precautions   Precautions None                    OT Treatments/Exercises (OP) - 05/11/20 0924      Exercises   Exercises Wrist;Hand;Theraputty      Additional Elbow Exercises   Hand Gripper with Large Beads all beads with gripper at 11# (alternated between horizontal and vertical)    Hand Gripper with Medium Beads 5 beads with gripper set at 11# (alternated between vertical and horizontal )      Wrist Exercises   Other wrist exercises forearm supination/pronation, P/ROM, A/ROM, 10X    Other wrist exercises wrist flexion, extension, radial deviation, ulnar deviation 10 times active and passive each      Additional Wrist Exercises   Sponges 16, 17      Hand Exercises   Other Hand Exercises used wrist flexion/extension bar while focusing wrist extnesion/flexion rolled strap up and then un rolled x 2 sets with moderate difficulty mobilizing her right wrist    Other  Hand Exercises Utilized red resistive clothespin in right hand with a 3 point pinch to transfer 25 sponges from table to container.      Neurological Re-education Exercises   Other Exercises 1 Using red resistive clothespin and 3 point pinch in right hand, patient stacked 5 towers of sponges (3 high)                    OT Short Term Goals - 04/26/20 4709      OT SHORT TERM GOAL #1   Title Patient will be educated on and independent with HEP to improve mobility required for ADL completion using RUE.    Time 4    Period Weeks    Status On-going    Target Date 05/24/20      OT SHORT TERM GOAL #2   Title Pt will decrease pain in RUE to 3/10 or less to improve ability to complete cooking tasks with minimal compensatory strategies.    Time 4    Period Weeks    Status On-going       OT SHORT TERM GOAL #3   Title Pt will increase right wrist A/ROM to [facility] to improve ability to perform dressing and bathing tasks.    Time 4    Period Weeks    Status On-going      OT SHORT TERM GOAL #4   Title Pt will increase right wrist strength to 4+/5 or greater to improve ability to perform lifting tasks at home and work.    Time 4    Period Weeks    Status On-going      OT SHORT TERM GOAL #5   Title Pt will increase right grip strength by 20# and pinch strength by 5# to improve ability to grasp and maintain hold on items/tools during functional use.    Time 4    Period Weeks    Status On-going                    Plan - 05/11/20 1016    Clinical Impression Statement A: Focused on A/ROM during wrist flexion/extension with passive stretching completed prior. No fascial restrictions noted during session. Completed pinch and grip strengthening task with VC for form and technique. Continues to limited with wrist flexion and extension although able to tolerate passive stretching during session.    Body Structure / Function / Physical Skills ADL;Endurance;UE functional use;Fascial restriction;Pain;ROM;IADL;Strength    Plan P: Take measurements for follow up visit with Dr. Aline Brochure today and send after session. D/C myofascial release. Discussed making more appointments although unsure about removing hardware until MD appointment. She will let us know what Dr. Aline Brochure is recommending when she sees him.    Consulted and Agree with Plan of Care Patient           Patient will benefit from skilled therapeutic intervention in order to improve the following deficits and impairments:   Body Structure / Function / Physical Skills: ADL,Endurance,UE functional use,Fascial restriction,Pain,ROM,IADL,Strength       Visit Diagnosis: Other symptoms and signs involving the musculoskeletal system  Stiffness of right wrist, not elsewhere classified  Pain in right  wrist  Stiffness of right hand, not elsewhere classified    Problem List Patient Active Problem List   Diagnosis Date Noted  . Encounter for screening fecal occult blood testing 04/25/2020  . Encounter for well woman exam with routine gynecological exam 04/25/2020  . Fracture, [place], right, closed 02/29/2020  .  Closed fracture of right wrist   . Thrombocytosis 07/19/2019  . Well woman exam with routine gynecological exam 02/11/2018  . Screening for colorectal cancer 02/11/2018  . Pituitary abnormality ([place]) 12/26/2016  . Hyperlipidemia 06/30/2016  . Osteopenia 01/04/2014  . Migraines 01/04/2014  . Medial meniscus, posterior horn derangement 01/29/2012  . Chondromalacia of patella, left 01/29/2012    Ailene Ravel, OTR/L,CBIS  [phone]  05/11/2020, 10:29 AM  [place], [place], [address] Phone: [phone]   Fax:  [phone]  Name: Kylie Duffy MRN: [id number] Date of Birth: 23-Oct-1955

## 2020-05-16 ENCOUNTER — Ambulatory Visit (INDEPENDENT_AMBULATORY_CARE_PROVIDER_SITE_OTHER): Payer: 59 | Admitting: Orthopedic Surgery

## 2020-05-16 ENCOUNTER — Ambulatory Visit: Payer: 59

## 2020-05-16 ENCOUNTER — Ambulatory Visit (HOSPITAL_COMMUNITY): Payer: 59 | Admitting: Occupational Therapy

## 2020-05-16 ENCOUNTER — Other Ambulatory Visit: Payer: Self-pay

## 2020-05-16 ENCOUNTER — Encounter (HOSPITAL_COMMUNITY): Payer: Self-pay | Admitting: Occupational Therapy

## 2020-05-16 DIAGNOSIS — M25531 Pain in right wrist: Secondary | ICD-10-CM

## 2020-05-16 DIAGNOSIS — S52531D Colles' fracture of right radius, subsequent encounter for closed fracture with routine healing: Secondary | ICD-10-CM | POA: Diagnosis not present

## 2020-05-16 DIAGNOSIS — M25641 Stiffness of right hand, not elsewhere classified: Secondary | ICD-10-CM | POA: Diagnosis not present

## 2020-05-16 DIAGNOSIS — R600 Localized edema: Secondary | ICD-10-CM | POA: Diagnosis not present

## 2020-05-16 DIAGNOSIS — R29898 Other symptoms and signs involving the musculoskeletal system: Secondary | ICD-10-CM | POA: Diagnosis not present

## 2020-05-16 DIAGNOSIS — M25631 Stiffness of right wrist, not elsewhere classified: Secondary | ICD-10-CM | POA: Diagnosis not present

## 2020-05-16 NOTE — Addendum Note (Signed)
Addended byCandice Camp on: 05/16/2020 02:04 PM   Modules accepted: Orders, SmartSet

## 2020-05-16 NOTE — Therapy (Addendum)
[place], [place], [address] Phone: [phone]   Fax:  [phone]  Occupational Therapy Treatment  Patient Details  Name: Kylie Duffy MRN: [id number] Date of Birth: [birth year] Referring Provider (OT): Dr. Arther Abbott  Encounter Date: 05/16/2020       [facility] OT Assessment - 05/16/20 0735      Assessment   Medical Diagnosis s/p right radius fx with percutaneous pinning      Precautions   Precautions None      Observation/Other Assessments   Quick DASH  31.82   56.82 previous     AROM   Right Forearm Supination 53 Degrees   45 previous   Right Wrist Extension 40 Degrees   20 previous   Right Wrist Flexion 45 Degrees   35 previous   Right Wrist Radial Deviation 24 Degrees   20 previous   Right Wrist Ulnar Deviation 30 Degrees   18 previous     PROM   Right/Left Forearm Right    Right Forearm Pronation 90 Degrees   same as previous   Right Forearm Supination 53 Degrees   45 previous   Right/Left Wrist Right    Right Wrist Extension 55 Degrees   35 previous   Right Wrist Flexion 50 Degrees   42 previous   Right Wrist Radial Deviation 24 Degrees   20 previous   Right Wrist Ulnar Deviation 30 Degrees   20 previous     Strength   Right/Left Forearm Right    Right Forearm Pronation 4/5   4-/5 previous   Right Forearm Supination 4-/5   same as previous   Right/Left Wrist Right    Right Wrist Flexion 4+/5   3+/5 previous   Right Wrist Extension 4/5   4-/5 previous   Right Wrist Radial Deviation 4-/5   3/5 previous   Right Wrist Ulnar Deviation 4-/5   3+/5 previous   Right/Left hand Right    Right Hand Gross Grasp Functional    Right Hand Grip (lbs) 18   10 previous   Right Hand Lateral Pinch 10 lbs   6 previous   Right Hand 3 Point Pinch 6 lbs   6 previous               OT End of Session - 05/16/20 0828    Visit Number 7    Number of Visits 8    Date for OT Re-Evaluation 05/24/20     Authorization Type UMR, $20 copay    Authorization Time Period no visit limit    OT Start Time 0733    OT Stop Time 0812    OT Time Calculation (min) 39 min    Activity Tolerance Patient tolerated treatment well    Behavior During Therapy [facility] for tasks assessed/performed           Past Medical History:  Diagnosis Date  . Burning with urination 11/01/2014  . Hematuria 07/28/2013  . History of UTI   . Hyperlipidemia   . Osteopenia   . PONV (postoperative nausea and vomiting)   . Vaginal dryness 12/03/2012    Past Surgical History:  Procedure Laterality Date  . ABDOMINAL HYSTERECTOMY    . BREAST BIOPSY Left   . CLOSED REDUCTION WRIST FRACTURE Right 02/24/2020   Procedure: CLOSED REDUCTION WRIST with pin placement;  Surgeon: Carole Civil, MD;  Location: [facility];  Service: Orthopedics;  Laterality: Right;  . COLONOSCOPY N/A 08/25/2016  Procedure: COLONOSCOPY;  Surgeon: Danie Binder, MD;  [facility];  Service: Endoscopy;  Laterality: N/A;  . DIAGNOSTIC LAPAROSCOPY    . KNEE ARTHROSCOPY WITH MEDIAL MENISECTOMY  01/26/2012   Procedure: KNEE ARTHROSCOPY WITH MEDIAL MENISECTOMY;  Surgeon: Carole Civil, MD;  Location: [facility];  Service: Orthopedics;  Laterality: Left;  . POLYPECTOMY  08/25/2016   Procedure: POLYPECTOMY;  Surgeon: Danie Binder, MD;  Location: [facility];  Service: Endoscopy;;  . TONSILLECTOMY      There were no vitals filed for this visit.   Subjective Assessment - 05/16/20 0735    Subjective  S: I see the doctor today.    Currently in Pain? No/denies               [name] - 05/16/20 0755    Open a tight or new jar Severe difficulty    Do heavy household chores (wash walls, wash floors) Severe difficulty    Carry a shopping bag or briefcase Moderate difficulty    Wash your back Mild difficulty    Use a knife to cut food No difficulty    Recreational activities in which you take some force or impact through your arm,  shoulder, or hand (golf, hammering, tennis) Unable    During the past week, to what extent has your arm, shoulder or hand problem interfered with your normal social activities with family, friends, neighbors, or groups? Not at all    During the past week, to what extent has your arm, shoulder or hand problem limited your work or other regular daily activities Not at all    Arm, shoulder, or hand pain. Mild    Tingling (pins and needles) in your arm, shoulder, or hand None    Difficulty Sleeping No difficulty    DASH Score 31.82 %                OT Treatments/Exercises (OP) - 05/16/20 0736      Exercises   Exercises Wrist;Hand;Theraputty      Wrist Exercises   Wrist Flexion PROM;5 reps;AROM;10 reps    Wrist Extension PROM;5 reps;AROM;10 reps    Wrist Radial Deviation PROM;5 reps;AROM;10 reps    Wrist Ulnar Deviation PROM;5 reps;AROM;10 reps    Other wrist exercises forearm supination/pronation, P/ROM 5X, A/ROM, 10X    Other wrist exercises velcro board-supination/pronation 2x      Additional Wrist Exercises   Hand Gripper with Large Beads all beads with gripper at 11# vertical    Hand Gripper with Medium Beads all beads with gripper at 11#, vertical      Hand Exercises   Other Hand Exercises used wrist flexion/extension bar while focusing wrist extnesion/flexion rolled strap up and then un rolled x 2 sets with moderate difficulty mobilizing her right wrist                    OT Short Term Goals - 04/26/20 1308      OT SHORT TERM GOAL #1   Title Patient will be educated on and independent with HEP to improve mobility required for ADL completion using RUE.    Time 4    Period Weeks    Status On-going    Target Date 05/24/20      OT SHORT TERM GOAL #2   Title Pt will decrease pain in RUE to 3/10 or less to improve ability to complete cooking tasks with minimal compensatory strategies.    Time 4  Period Weeks    Status On-going      OT SHORT TERM GOAL #3    Title Pt will increase right wrist A/ROM to [facility] to improve ability to perform dressing and bathing tasks.    Time 4    Period Weeks    Status On-going      OT SHORT TERM GOAL #4   Title Pt will increase right wrist strength to 4+/5 or greater to improve ability to perform lifting tasks at home and work.    Time 4    Period Weeks    Status On-going      OT SHORT TERM GOAL #5   Title Pt will increase right grip strength by 20# and pinch strength by 5# to improve ability to grasp and maintain hold on items/tools during functional use.    Time 4    Period Weeks    Status On-going                    Plan - 05/16/20 4097    Clinical Impression Statement A: Measurements taken for MD appt today. Pt has made good progress and has increased her ROM and strength of her right wrist and hand, also reports minimal pain with use and exercises. No fascial restrictions at this time. Continued with passive stretching and A/ROM, as well as grip strengthening. Pt was able to complete gripper task with gripper in vertical position, all beads for both large and medium sizes. Continues to be limited with wrist extension with flexion/extension bar. Added velcro board for forearm supination work. Verbal cuing for form and technique.    Body Structure / Function / Physical Skills ADL;Endurance;UE functional use;Fascial restriction;Pain;ROM;IADL;Strength    Plan P: Follow up on MD appt and determine if additional appts are needed now or if we need to hold until pins are removed.    OT Home Exercise Plan eval: wrist A/ROM, yellow theraputty for grip and pinch 4/6: Wrist stretches    Consulted and Agree with Plan of Care Patient           Patient will benefit from skilled therapeutic intervention in order to improve the following deficits and impairments:   Body Structure / Function / Physical Skills: ADL,Endurance,UE functional use,Fascial restriction,Pain,ROM,IADL,Strength       Visit  Diagnosis: Other symptoms and signs involving the musculoskeletal system  Stiffness of right wrist, not elsewhere classified  Pain in right wrist  Stiffness of right hand, not elsewhere classified    Problem List Patient Active Problem List   Diagnosis Date Noted  . Encounter for screening fecal occult blood testing 04/25/2020  . Encounter for well woman exam with routine gynecological exam 04/25/2020  . Fracture, [place], right, closed 02/29/2020  . Closed fracture of right wrist   . Thrombocytosis 07/19/2019  . Well woman exam with routine gynecological exam 02/11/2018  . Screening for colorectal cancer 02/11/2018  . Pituitary abnormality ([place]) 12/26/2016  . Hyperlipidemia 06/30/2016  . Osteopenia 01/04/2014  . Migraines 01/04/2014  . Medial meniscus, posterior horn derangement 01/29/2012  . Chondromalacia of patella, left 01/29/2012   Guadelupe Sabin, OTR/L  [phone] 05/16/2020, 8:33 AM  [place], [place], [address] Phone: [phone]   Fax:  [phone]  Name: DRUE CAMERA MRN: [id number] Date of Birth: [birth year]

## 2020-05-16 NOTE — Progress Notes (Signed)
Chief Complaint  Patient presents with  . Follow-up    Recheck on right wrist, DOS 02-24-20.    Postop visit January 28  Postop week 11 percutaneous pinning closed reduction right wrist  X-rays we got a 16 degree radial inclination angle 4 mm of ulnar variance positive and then we have got 5 degrees volar tilt  Her motion is improving she is doing well she needs some help with the grip strength  She is happy with the result as of my  She will have to have her pins taken out as they are starting to poke on the skin she will set that up I will see her after that  Otherwise continue therapy

## 2020-05-16 NOTE — Patient Instructions (Signed)
SET UP SURGERY AND F/U AFTER THAT

## 2020-05-18 ENCOUNTER — Ambulatory Visit (HOSPITAL_COMMUNITY): Payer: 59 | Admitting: Specialist

## 2020-05-18 ENCOUNTER — Encounter (HOSPITAL_COMMUNITY): Payer: Self-pay | Admitting: Specialist

## 2020-05-18 ENCOUNTER — Other Ambulatory Visit: Payer: Self-pay

## 2020-05-18 DIAGNOSIS — M25631 Stiffness of right wrist, not elsewhere classified: Secondary | ICD-10-CM

## 2020-05-18 DIAGNOSIS — M25641 Stiffness of right hand, not elsewhere classified: Secondary | ICD-10-CM

## 2020-05-18 DIAGNOSIS — R29898 Other symptoms and signs involving the musculoskeletal system: Secondary | ICD-10-CM

## 2020-05-18 DIAGNOSIS — R600 Localized edema: Secondary | ICD-10-CM | POA: Diagnosis not present

## 2020-05-18 DIAGNOSIS — M25531 Pain in right wrist: Secondary | ICD-10-CM | POA: Diagnosis not present

## 2020-05-18 NOTE — Therapy (Signed)
[facility] [address] [place], [place], [address] Phone: [phone]   Fax:  [phone]  Occupational Therapy Treatment  Patient Details  Name: Kylie Duffy MRN: [id number] Date of Birth: 1955-02-06 Referring Provider (OT): Dr. Arther Abbott   Encounter Date: 05/18/2020   OT End of Session - 05/18/20 1144    Visit Number 8    Number of Visits 20    Date for OT Re-Evaluation 06/29/20    Authorization Type UMR, $20 copay    Authorization Time Period no visit limit    OT Start Time 0828    OT Stop Time 0900   patient arrived late   OT Time Calculation (min) 32 min    Activity Tolerance Patient tolerated treatment well    Behavior During Therapy [facility] for tasks assessed/performed           Past Medical History:  Diagnosis Date  . Burning with urination 11/01/2014  . Hematuria 07/28/2013  . History of UTI   . Hyperlipidemia   . Osteopenia   . PONV (postoperative nausea and vomiting)   . Vaginal dryness 12/03/2012    Past Surgical History:  Procedure Laterality Date  . ABDOMINAL HYSTERECTOMY    . BREAST BIOPSY Left   . CLOSED REDUCTION WRIST FRACTURE Right 02/24/2020   Procedure: CLOSED REDUCTION WRIST with pin placement;  Surgeon: Carole Civil, MD;  Location: [facility];  Service: Orthopedics;  Laterality: Right;  . COLONOSCOPY N/A 08/25/2016   Procedure: COLONOSCOPY;  Surgeon: Danie Binder, MD;  [facility];  Service: Endoscopy;  Laterality: N/A;  . DIAGNOSTIC LAPAROSCOPY    . KNEE ARTHROSCOPY WITH MEDIAL MENISECTOMY  01/26/2012   Procedure: KNEE ARTHROSCOPY WITH MEDIAL MENISECTOMY;  Surgeon: Carole Civil, MD;  Location: [facility];  Service: Orthopedics;  Laterality: Left;  . POLYPECTOMY  08/25/2016   Procedure: POLYPECTOMY;  Surgeon: Danie Binder, MD;  [facility];  Service: Endoscopy;;  . TONSILLECTOMY      There were no vitals filed for this visit.   Subjective Assessment - 05/18/20  1143    Subjective  S:  I cant have surgery until 5/13!  I hope he can do it sooner.    Pertinent History per MD note, conitnue therapy and work on grip strength in addition to ROM    Currently in Pain? Yes    Pain Score 2     Pain Location Wrist    Pain Orientation Right    Pain Descriptors / Indicators Aching              [facility] OT Assessment - 05/18/20 0001      Assessment   Medical Diagnosis s/p right radius fx with percutaneous pinning    Referring Provider (OT) Dr. Arther Abbott    Onset Date/Surgical Date 02/13/20      Precautions   Precautions None                    OT Treatments/Exercises (OP) - 05/18/20 0001      Exercises   Exercises Wrist;Hand;Theraputty      Wrist Exercises   Other wrist exercises forearm supination/pronation, wrist flexion/extension, P/ROM 5X, A/ROM, 10X      Hand Exercises   Other Hand Exercises sponges for improved grip strength:  18,20,18      Theraputty   Theraputty - Flatten with red putty in standing, completed sustained wrist pull x 5 with PVC  Theraputty - Roll red    Theraputty - Grip red with focus on wrist extension      Manual Therapy   Manual Therapy Myofascial release    Manual therapy comments completed separately from therapeutic exercises    Myofascial Release myofascial release to dorsal hand, wrist, and forearm to decrease fascial restrictions and muscle tightness, decrease pain, and increase joint ROM                  OT Education - 05/18/20 1144    Education Details reviewed POC to continue OT until surgery, focus on grip strength in addition to A/ROM    Person(s) Educated Patient    Methods Explanation    Comprehension Verbalized understanding            OT Short Term Goals - 05/18/20 1147      OT SHORT TERM GOAL #1   Title Patient will be educated on and independent with HEP to improve mobility required for ADL completion using RUE.    Time 4    Period Weeks    Status On-going     Target Date 05/24/20      OT SHORT TERM GOAL #2   Title Pt will decrease pain in RUE to 3/10 or less to improve ability to complete cooking tasks with minimal compensatory strategies.    Time 4    Period Weeks    Status Achieved      OT SHORT TERM GOAL #3   Title Pt will increase right wrist A/ROM to [facility] to improve ability to perform dressing and bathing tasks.    Time 4    Period Weeks    Status On-going      OT SHORT TERM GOAL #4   Title Pt will increase right wrist strength to 4+/5 or greater to improve ability to perform lifting tasks at home and work.    Time 4    Period Weeks    Status On-going      OT SHORT TERM GOAL #5   Title Pt will increase right grip strength by 20# and pinch strength by 5# to improve ability to grasp and maintain hold on items/tools during functional use.    Time 4    Period Weeks    Status On-going                    Plan - 05/18/20 1145    Clinical Impression Statement A:  Continues to improve P/ROM and A/ROM in wrist and forearm with movements much more fluid and natural.  Increased to pink theraputty for increased grip strength challenge.    Body Structure / Function / Physical Skills ADL;Endurance;UE functional use;Fascial restriction;Pain;ROM;IADL;Strength    OT Frequency 2x / week    OT Duration 6 weeks    OT Treatment/Interventions Self-care/ADL training;Ultrasound;Patient/family education;Passive range of motion;Cryotherapy;Electrical Stimulation;Splinting;Moist Heat;Therapeutic exercise;Manual Therapy;Therapeutic activities    Plan P:  Continue to improve a/rom and focus on improved grip strength and coordinated wrist extension with reaching and gripping.    Consulted and Agree with Plan of Care Patient           Patient will benefit from skilled therapeutic intervention in order to improve the following deficits and impairments:   Body Structure / Function / Physical Skills: ADL,Endurance,UE functional use,Fascial  restriction,Pain,ROM,IADL,Strength       Visit Diagnosis: Other symptoms and signs involving the musculoskeletal system - Plan: Ot plan of care cert/re-cert  Stiffness of right wrist, not  elsewhere classified - Plan: Ot plan of care cert/re-cert  Pain in right wrist - Plan: Ot plan of care cert/re-cert  Stiffness of right hand, not elsewhere classified - Plan: Ot plan of care cert/re-cert    Problem List Patient Active Problem List   Diagnosis Date Noted  . Encounter for screening fecal occult blood testing 04/25/2020  . Encounter for well woman exam with routine gynecological exam 04/25/2020  . Fracture, [place], right, closed 02/29/2020  . Closed fracture of right wrist   . Thrombocytosis 07/19/2019  . Well woman exam with routine gynecological exam 02/11/2018  . Screening for colorectal cancer 02/11/2018  . Pituitary abnormality ([place]) 12/26/2016  . Hyperlipidemia 06/30/2016  . Osteopenia 01/04/2014  . Migraines 01/04/2014  . Medial meniscus, posterior horn derangement 01/29/2012  . Chondromalacia of patella, left 01/29/2012    Vangie Bicker, [place], OTR/L [phone]  05/18/2020, 11:53 AM  [place], [place], [address] Phone: [phone]   Fax:  [phone]  Name: Kylie Duffy MRN: [id number] Date of Birth: [birth year]

## 2020-05-24 ENCOUNTER — Ambulatory Visit (HOSPITAL_COMMUNITY): Payer: 59 | Admitting: Specialist

## 2020-05-25 ENCOUNTER — Other Ambulatory Visit: Payer: Self-pay | Admitting: Orthopedic Surgery

## 2020-05-25 ENCOUNTER — Other Ambulatory Visit: Payer: Self-pay

## 2020-05-25 ENCOUNTER — Other Ambulatory Visit (HOSPITAL_COMMUNITY): Payer: Self-pay

## 2020-05-25 ENCOUNTER — Encounter (HOSPITAL_COMMUNITY): Payer: Self-pay | Admitting: Occupational Therapy

## 2020-05-25 ENCOUNTER — Ambulatory Visit (HOSPITAL_COMMUNITY): Payer: 59 | Admitting: Occupational Therapy

## 2020-05-25 DIAGNOSIS — M25531 Pain in right wrist: Secondary | ICD-10-CM

## 2020-05-25 DIAGNOSIS — M25631 Stiffness of right wrist, not elsewhere classified: Secondary | ICD-10-CM

## 2020-05-25 DIAGNOSIS — R29898 Other symptoms and signs involving the musculoskeletal system: Secondary | ICD-10-CM

## 2020-05-25 MED ORDER — CEPHALEXIN 500 MG PO CAPS: 500.0000 mg | ORAL_CAPSULE | Freq: Two times a day (BID) | ORAL | 0 refills | Status: DC

## 2020-05-25 NOTE — Therapy (Signed)
[facility] [address] [place], [place], [address] Phone: [phone]   Fax:  [phone]  Patient Details  Name: Kylie Duffy MRN: [id number] Date of Birth: [birth year] Referring Provider:  Eulogio Bear, NP  Encounter Date: 05/25/2020     05/25/20 0833  OT Visits / Re-Eval  Visit Number 9  Number of Visits 20  Date for OT Re-Evaluation 06/29/20  Authorization  Authorization Type UMR, $20 copay  Authorization Time Period no visit limit  OT Time Calculation  OT Start Time 0820  OT Stop Time 0830  OT Time Calculation (min) 10 min  End of Session  Activity Tolerance Patient tolerated treatment well  Behavior During Therapy [facility] for tasks assessed/performed    Cancellation Note: Pt arrived to appt and initiated passive stretching however the pin on her radial side of her wrist presents as extremely red, with a white area, and began oozing clear fluid.  We stopped therapy and instructed her to go to MD office and have MD take a look at it. MD alerted through [employer] system. Pt is currently scheduled for pin removal on 05/29/20. No charge for rehab visit today.   Guadelupe Sabin, OTR/L  [phone] 05/25/2020, 8:39 AM  [place], [place], [address] Phone: [phone]   Fax:  [phone]

## 2020-05-25 NOTE — Patient Instructions (Signed)
Kylie Duffy  05/25/2020     @PREFPERIOPPHARMACY @   Your procedure is scheduled on  05/29/2020   Report to [facility] at  1100  A.M.   Call this number if you have problems the morning of surgery:  [phone]   Remember:  Do not eat or drink after midnight.                        Take these medicines the morning of surgery with A SIP OF WATER  imitrex (if needed).     Place clean sheets on your bed the night before your procedure and DO NOT sleep with pets this night.  Shower with CHG the night before and the morning of your procedure. DO NOT use CHG on your face, hair or genitals.  After each shower, dry off with a clean, towel, put on clean, comfortable clothes and brush your teeth.      Do not wear jewelry, make-up or nail polish.  Do not wear lotions, powders, or perfumes, or deodorant.  Do not shave 48 hours prior to surgery.  Men may shave face and neck.  Do not bring valuables to the hospital.  [facility] is not responsible for any belongings or valuables.   Contacts, dentures or bridgework may not be worn into surgery.  Leave your suitcase in the car.  After surgery it may be brought to your room.  For patients admitted to the hospital, discharge time will be determined by your treatment team.  Patients discharged the day of surgery will not be allowed to drive home and must have someone with them for 24 hours.    Special instructions:  DO NOT smoke tobacco or vape for 24 hours before your procedure.  Please read over the following fact sheets that you were given. Coughing and Deep Breathing, Surgical Site Infection Prevention, Anesthesia Post-op Instructions and Care and Recovery After Surgery       Orthopedic Hardware Removal, Care After This sheet gives you information about how to care for yourself after your procedure. Your health care provider may also give you more specific instructions. If you have problems or questions, contact your  health care provider. What can I expect after the procedure? After the procedure, it is common to have:  Soreness or pain.  Some swelling in the area where the hardware was removed.  A small amount of blood or clear fluid coming from your incision. Follow these instructions at home: If you have a cast:  Do not stick anything inside the cast to scratch your skin. Doing that increases your risk of infection.  Check the skin around the cast every day. Tell your health care provider about any concerns.  You may put lotion on dry skin around the edges of the cast. Do not put lotion on the skin underneath the cast.  Keep the cast clean and dry. If you have a splint or boot:  Wear the splint or boot as told by your health care provider. Remove it only as told by your health care provider.  Loosen the splint or boot if your fingers or toes tingle, become numb, or turn cold and blue.  Keep the splint or boot clean and dry. Bathing  Do not take baths, swim, or use a hot tub until your health care provider approves. Ask your health care provider if you may take showers. You may only be allowed to take  sponge baths.  Keep the bandage (dressing) dry until your health care provider says it can be removed.  If your cast, splint, or boot is not waterproof: ? Do not let it get wet. ? Cover it with a watertight covering when you take a bath or a shower. Incision care  Follow instructions from your health care provider about how to take care of your incision. Make sure you: ? Wash your hands with soap and water before you change your dressing. If soap and water are not available, use hand sanitizer. ? Change your dressing as told by your health care provider. ? Leave stitches (sutures), skin glue, or adhesive strips in place. These skin closures may need to stay in place for 2 weeks or longer. If adhesive strip edges start to loosen and curl up, you may trim the loose edges. Do not remove  adhesive strips completely unless your health care provider tells you to do that.  Check your incision area every day for signs of infection. Check for: ? Redness. ? More swelling or pain. ? More fluid or blood. ? Warmth. ? Pus or a bad smell.   Managing pain, stiffness, and swelling  If directed, put ice on the affected area: ? If you have a removable splint or boot, remove it as told by your health care provider. ? Put ice in a plastic bag. ? Place a towel between your skin and the bag. ? Leave the ice on for 20 minutes, 2-3 times a day.  Move your fingers or toes often to avoid stiffness and to lessen swelling.  Raise (elevate) the injured area above the level of your heart while you are sitting or lying down.   Driving  Do not drive or use heavy machinery while taking prescription pain medicine.  Do not drive for 24 hours if you were given a medicine to help you relax (sedative) during your procedure.  Ask your health care provider when it is safe to drive if you have a cast, splint, or boot on the affected limb. Activity  Ask your health care provider what activities are safe for you during recovery, and ask what activities you need to avoid.  Do not use the injured limb to support your body weight until your health care provider says that you can.  Do not play contact sports until your health care provider approves.  Do exercises as told by your health care provider.  Avoid sitting for a long time without moving. Get up and move around at least every few hours. This will help prevent blood clots. General instructions  Do not put pressure on any part of the cast or splint until it is fully hardened. This may take several hours.  If you are taking prescription pain medicine, take actions to prevent or treat constipation. Your health care provider may recommend that you: ? Drink enough fluid to keep your urine pale yellow. ? Eat foods that are high in fiber, such as  fresh fruits and vegetables, whole grains, and beans. ? Limit foods that are high in fat and processed sugars, such as fried or sweet foods. ? Take an over-the-counter or prescription medicine for constipation.  Do not use any products that contain nicotine or tobacco, such as cigarettes and e-cigarettes. These can delay bone healing after surgery. If you need help quitting, ask your health care provider.  Take over-the-counter and prescription medicines only as told by your health care provider.  Keep all follow-up visits as  told by your health care provider. This is important. Contact a health care provider if:  You have lasting pain.  You have redness around your incision.  You have more swelling or pain around your incision.  You have more fluid or blood coming from your incision.  Your incision feels warm to the touch.  You have pus or a bad smell coming from your incision.  You are unable to do exercises or physical activity as told by your health care provider. Get help right away if:  You have difficulty breathing.  You have chest pain.  You have severe pain.  You have a fever or chills.  You have numbness for more than 24 hours in the area where the hardware was removed. Summary  After the procedure, it is common to have some pain and swelling in the area where the hardware was removed.  Follow instructions from your health care provider about how to take care of your incision.  Return to your normal activities as told by your health care provider. Ask your health care provider what activities are safe for you. This information is not intended to replace advice given to you by your health care provider. Make sure you discuss any questions you have with your health care provider. Document Revised: 03/11/2018 Document Reviewed: 02/05/2017 Elsevier Patient Education  2021 [place] Anesthesia, Adult, Care After This sheet gives you information about  how to care for yourself after your procedure. Your health care provider may also give you more specific instructions. If you have problems or questions, contact your health care provider. What can I expect after the procedure? After the procedure, the following side effects are common:  Pain or discomfort at the IV site.  Nausea.  Vomiting.  Sore throat.  Trouble concentrating.  Feeling cold or chills.  Feeling weak or tired.  Sleepiness and fatigue.  Soreness and body aches. These side effects can affect parts of the body that were not involved in surgery. Follow these instructions at home: For the time period you were told by your health care provider:  Rest.  Do not participate in activities where you could fall or become injured.  Do not drive or use machinery.  Do not drink alcohol.  Do not take sleeping pills or medicines that cause drowsiness.  Do not make important decisions or sign legal documents.  Do not take care of children on your own.   Eating and drinking  Follow any instructions from your health care provider about eating or drinking restrictions.  When you feel hungry, start by eating small amounts of foods that are soft and easy to digest (bland), such as toast. Gradually return to your regular diet.  Drink enough fluid to keep your urine pale yellow.  If you vomit, rehydrate by drinking water, juice, or clear broth. General instructions  If you have sleep apnea, surgery and certain medicines can increase your risk for breathing problems. Follow instructions from your health care provider about wearing your sleep device: ? Anytime you are sleeping, including during daytime naps. ? While taking prescription pain medicines, sleeping medicines, or medicines that make you drowsy.  Have a responsible adult stay with you for the time you are told. It is important to have someone help care for you until you are awake and alert.  Return to your normal  activities as told by your health care provider. Ask your health care provider what activities are safe for you.  Take over-the-counter  and prescription medicines only as told by your health care provider.  If you smoke, do not smoke without supervision.  Keep all follow-up visits as told by your health care provider. This is important. Contact a health care provider if:  You have nausea or vomiting that does not get better with medicine.  You cannot eat or drink without vomiting.  You have pain that does not get better with medicine.  You are unable to pass urine.  You develop a skin rash.  You have a fever.  You have redness around your IV site that gets worse. Get help right away if:  You have difficulty breathing.  You have chest pain.  You have blood in your urine or stool, or you vomit blood. Summary  After the procedure, it is common to have a sore throat or nausea. It is also common to feel tired.  Have a responsible adult stay with you for the time you are told. It is important to have someone help care for you until you are awake and alert.  When you feel hungry, start by eating small amounts of foods that are soft and easy to digest (bland), such as toast. Gradually return to your regular diet.  Drink enough fluid to keep your urine pale yellow.  Return to your normal activities as told by your health care provider. Ask your health care provider what activities are safe for you. This information is not intended to replace advice given to you by your health care provider. Make sure you discuss any questions you have with your health care provider. Document Revised: 09/29/2019 Document Reviewed: 04/28/2019 Elsevier Patient Education  2021 [employer].

## 2020-05-28 ENCOUNTER — Other Ambulatory Visit: Payer: Self-pay

## 2020-05-28 ENCOUNTER — Encounter (HOSPITAL_COMMUNITY)
Admission: RE | Admit: 2020-05-28 | Discharge: 2020-05-28 | Disposition: A | Discharge status: Home / Self Care | Payer: 59 | Source: Ambulatory Visit | Attending: Orthopedic Surgery | Admitting: Orthopedic Surgery

## 2020-05-28 ENCOUNTER — Other Ambulatory Visit (HOSPITAL_COMMUNITY)
Admission: RE | Admit: 2020-05-28 | Discharge: 2020-05-28 | Disposition: A | Discharge status: Home / Self Care | Payer: 59 | Source: Ambulatory Visit | Attending: Orthopedic Surgery | Admitting: Orthopedic Surgery

## 2020-05-28 ENCOUNTER — Encounter (HOSPITAL_COMMUNITY): Payer: Self-pay

## 2020-05-28 DIAGNOSIS — Z01812 Encounter for preprocedural laboratory examination: Secondary | ICD-10-CM | POA: Diagnosis not present

## 2020-05-28 DIAGNOSIS — Z20822 Contact with and (suspected) exposure to covid-19: Secondary | ICD-10-CM | POA: Insufficient documentation

## 2020-05-28 LAB — CBC WITH DIFFERENTIAL/PLATELET
Abs Immature Granulocytes: 0.03 10*3/uL (ref 0.00–0.07)
Basophils Absolute: 0.1 10*3/uL (ref 0.0–0.1)
Basophils Relative: 1 %
Eosinophils Absolute: 0.1 10*3/uL (ref 0.0–0.5)
Eosinophils Relative: 2 %
HCT: 41.2 % (ref 36.0–46.0)
Hemoglobin: 13.2 g/dL (ref 12.0–15.0)
Immature Granulocytes: 0 %
Lymphocytes Relative: 27 %
Lymphs Abs: 2 10*3/uL (ref 0.7–4.0)
MCH: 29.5 pg (ref 26.0–34.0)
MCHC: 32 g/dL (ref 30.0–36.0)
MCV: 92.2 fL (ref 80.0–100.0)
Monocytes Absolute: 0.4 10*3/uL (ref 0.1–1.0)
Monocytes Relative: 5 %
Neutro Abs: 4.8 10*3/uL (ref 1.7–7.7)
Neutrophils Relative %: 65 %
Platelets: 522 10*3/uL — ABNORMAL HIGH (ref 150–400)
RBC: 4.47 MIL/uL (ref 3.87–5.11)
RDW: 13.8 % (ref 11.5–15.5)
WBC: 7.4 10*3/uL (ref 4.0–10.5)
nRBC: 0 % (ref 0.0–0.2)

## 2020-05-28 LAB — BASIC METABOLIC PANEL
Anion gap: 7 (ref 5–15)
BUN: 20 mg/dL (ref 8–23)
CO2: 26 mmol/L (ref 22–32)
Calcium: 8.9 mg/dL (ref 8.9–10.3)
Chloride: 105 mmol/L (ref 98–111)
Creatinine, Ser: 0.62 mg/dL (ref 0.44–1.00)
GFR, Estimated: >60 mL/min (ref >60)
Glucose, Bld: 88 mg/dL (ref 70–99)
Potassium: 3.8 mmol/L (ref 3.5–5.1)
Sodium: 138 mmol/L (ref 135–145)

## 2020-05-28 NOTE — H&P (Signed)
Kylie Duffy is an 65 y.o. female.     Chief Complaint: Painful hardware right wrist  HPI: 65 year old female status post closed reduction percutaneous pinning in the OR of the right distal radius fracture on February 24, 2020.  The patient was advised that the pins would have to probably come out at some point.  They are now pressing against the skin check she had some breakthrough drainage when her surgery had to be postponed secondary to her schedule and OR schedule  Past Medical History:  Diagnosis Date  . Burning with urination 11/01/2014  . Hematuria 07/28/2013  . History of UTI   . Hyperlipidemia   . Osteopenia   . PONV (postoperative nausea and vomiting)   . Vaginal dryness 12/03/2012    Past Surgical History:  Procedure Laterality Date  . ABDOMINAL HYSTERECTOMY    . BREAST BIOPSY Left   . CLOSED REDUCTION WRIST FRACTURE Right 02/24/2020   Procedure: CLOSED REDUCTION WRIST with pin placement;  Surgeon: Carole Civil, MD;  Location: [facility];  Service: Orthopedics;  Laterality: Right;  . COLONOSCOPY N/A 08/25/2016   Procedure: COLONOSCOPY;  Surgeon: Danie Binder, MD;  Location: [facility];  Service: Endoscopy;  Laterality: N/A;  . DIAGNOSTIC LAPAROSCOPY    . KNEE ARTHROSCOPY WITH MEDIAL MENISECTOMY  01/26/2012   Procedure: KNEE ARTHROSCOPY WITH MEDIAL MENISECTOMY;  Surgeon: Carole Civil, MD;  Location: [facility];  Service: Orthopedics;  Laterality: Left;  . POLYPECTOMY  08/25/2016   Procedure: POLYPECTOMY;  Surgeon: Danie Binder, MD;  Location: [facility];  Service: Endoscopy;;  . TONSILLECTOMY      Family History  Problem Relation Age of Onset  . Hypertension Mother   . Hyperlipidemia Mother   . Thyroid disease Mother   . Kidney disease Mother   . Heart disease Father   . Hyperlipidemia Father   . Cancer Maternal Aunt   . Cancer Maternal Uncle   . Cancer Paternal Aunt   . Cancer Paternal Uncle   . Diabetes Maternal Grandmother   . Cancer Paternal  Grandmother        pancreatic  . Hyperlipidemia Brother        borderline  . Cancer Paternal Uncle   . Alzheimer's disease Paternal Aunt   . Colon cancer Neg Hx   . Colon polyps Neg Hx   . Breast cancer Neg Hx    Social History:  reports that she has never smoked. She has never used smokeless tobacco. She reports previous alcohol use. She reports that she does not use drugs.  Allergies: No Known Allergies  No medications prior to admission.    Results for orders placed or performed during the hospital encounter of 05/28/20 (from the past 48 hour(s))  CBC WITH DIFFERENTIAL     Status: Abnormal   Collection Time: 05/28/20  9:58 AM  Result Value Ref Range   WBC 7.4 4.0 - 10.5 K/uL   RBC 4.47 3.87 - 5.11 MIL/uL   Hemoglobin 13.2 12.0 - 15.0 g/dL   HCT 41.2 36.0 - 46.0 %   MCV 92.2 80.0 - 100.0 fL   MCH 29.5 26.0 - 34.0 pg   MCHC 32.0 30.0 - 36.0 g/dL   RDW 13.8 11.5 - 15.5 %   Platelets 522 (H) 150 - 400 K/uL   nRBC 0.0 0.0 - 0.2 %   Neutrophils Relative % 65 %   Neutro Abs 4.8 1.7 - 7.7 K/uL   Lymphocytes Relative 27 %  Lymphs Abs 2.0 0.7 - 4.0 K/uL   Monocytes Relative 5 %   Monocytes Absolute 0.4 0.1 - 1.0 K/uL   Eosinophils Relative 2 %   Eosinophils Absolute 0.1 0.0 - 0.5 K/uL   Basophils Relative 1 %   Basophils Absolute 0.1 0.0 - 0.1 K/uL   Immature Granulocytes 0 %   Abs Immature Granulocytes 0.03 0.00 - 0.07 K/uL    Comment: Performed at [facility], [address], [place], [place] [address]  Basic metabolic panel     Status: None   Collection Time: 05/28/20  9:58 AM  Result Value Ref Range   Sodium 138 135 - 145 mmol/L   Potassium 3.8 3.5 - 5.1 mmol/L   Chloride 105 98 - 111 mmol/L   CO2 26 22 - 32 mmol/L   Glucose, Bld 88 70 - 99 mg/dL    Comment: Glucose reference range applies only to samples taken after fasting for at least 8 hours.   BUN 20 8 - 23 mg/dL   Creatinine, Ser 0.62 0.44 - 1.00 mg/dL   Calcium 8.9 8.9 - 10.3 mg/dL   GFR, Estimated >60 >60  mL/min    Comment: (NOTE) Calculated using the CKD-EPI Creatinine Equation (2021)    Anion gap 7 5 - 15    Comment: Performed at [facility], [address], [place], [place] [address]   No results found.  Review of Systems No chest pain or shortness of breath There were no vitals taken for this visit. Physical Exam  Appearance patient is well-developed and well-nourished body frame thin She is oriented x3 Mood is pleasant affect is normal No major gait abnormalities although she does seem to walk with a flexed knee gait  Right wrist has prominent pin areas with 1 scab over a previously draining pin these areas are red and tender wrist range of motion is 90% of the wrist is stable her grip strength is 90% of the skin is as described pulses are good temperature is normal the lymph nodes are negative and there are no sensory deficits   Assessment/Plan Healed fracture right wrist prominent hardware  Patient placed on Keflex starting Friday, April 29 with good response  Arther Abbott, MD 05/28/2020, 2:45 PM

## 2020-05-29 ENCOUNTER — Other Ambulatory Visit: Payer: Self-pay

## 2020-05-29 ENCOUNTER — Ambulatory Visit (HOSPITAL_COMMUNITY): Payer: 59 | Admitting: Anesthesiology

## 2020-05-29 ENCOUNTER — Encounter (HOSPITAL_COMMUNITY)
Admission: RE | Disposition: A | Discharge status: Home / Self Care | Payer: Self-pay | Source: Home / Self Care | Attending: Orthopedic Surgery

## 2020-05-29 ENCOUNTER — Encounter (HOSPITAL_COMMUNITY): Payer: Self-pay | Admitting: Orthopedic Surgery

## 2020-05-29 ENCOUNTER — Ambulatory Visit (HOSPITAL_COMMUNITY): Payer: 59

## 2020-05-29 ENCOUNTER — Ambulatory Visit (HOSPITAL_COMMUNITY)
Admission: RE | Admit: 2020-05-29 | Discharge: 2020-05-29 | Disposition: A | Discharge status: Home / Self Care | Payer: 59 | Attending: Orthopedic Surgery | Admitting: Orthopedic Surgery

## 2020-05-29 DIAGNOSIS — Z09 Encounter for follow-up examination after completed treatment for conditions other than malignant neoplasm: Secondary | ICD-10-CM

## 2020-05-29 DIAGNOSIS — T8484XA Pain due to internal orthopedic prosthetic devices, implants and grafts, initial encounter: Secondary | ICD-10-CM | POA: Insufficient documentation

## 2020-05-29 DIAGNOSIS — Z8249 Family history of ischemic heart disease and other diseases of the circulatory system: Secondary | ICD-10-CM | POA: Diagnosis not present

## 2020-05-29 DIAGNOSIS — Z90710 Acquired absence of both cervix and uterus: Secondary | ICD-10-CM | POA: Diagnosis not present

## 2020-05-29 DIAGNOSIS — E785 Hyperlipidemia, unspecified: Secondary | ICD-10-CM | POA: Insufficient documentation

## 2020-05-29 DIAGNOSIS — Y831 Surgical operation with implant of artificial internal device as the cause of abnormal reaction of the patient, or of later complication, without mention of misadventure at the time of the procedure: Secondary | ICD-10-CM | POA: Diagnosis not present

## 2020-05-29 DIAGNOSIS — Z472 Encounter for removal of internal fixation device: Secondary | ICD-10-CM | POA: Diagnosis not present

## 2020-05-29 DIAGNOSIS — S52501S Unspecified fracture of the lower end of right radius, sequela: Secondary | ICD-10-CM

## 2020-05-29 DIAGNOSIS — S52501A Unspecified fracture of the lower end of right radius, initial encounter for closed fracture: Secondary | ICD-10-CM | POA: Diagnosis not present

## 2020-05-29 HISTORY — PX: HARDWARE REMOVAL: SHX979

## 2020-05-29 LAB — SARS CORONAVIRUS 2 (TAT 6-24 HRS): SARS Coronavirus 2: NEGATIVE

## 2020-05-29 SURGERY — REMOVAL, HARDWARE
Anesthesia: General | Laterality: Right

## 2020-05-29 MED ORDER — DEXAMETHASONE SODIUM PHOSPHATE 10 MG/ML IJ SOLN
INTRAMUSCULAR | Status: AC
  Filled 2020-05-29: qty 1

## 2020-05-29 MED ORDER — EPHEDRINE SULFATE 50 MG/ML IJ SOLN
INTRAMUSCULAR | Status: DC | PRN
  Administered 2020-05-29: 10 mg via INTRAVENOUS

## 2020-05-29 MED ORDER — SODIUM CHLORIDE 0.9 % IV SOLN
INTRAVENOUS | Status: DC | PRN
  Administered 2020-05-29: 100 ug via INTRAVENOUS

## 2020-05-29 MED ORDER — SODIUM CHLORIDE 0.9 % IR SOLN
Status: DC | PRN
  Administered 2020-05-29: 1000 mL

## 2020-05-29 MED ORDER — FENTANYL CITRATE (PF) 100 MCG/2ML IJ SOLN
INTRAMUSCULAR | Status: AC
  Filled 2020-05-29: qty 2

## 2020-05-29 MED ORDER — FENTANYL CITRATE (PF) 100 MCG/2ML IJ SOLN: 25.0000 ug | INTRAMUSCULAR | Status: DC | PRN

## 2020-05-29 MED ORDER — BUPIVACAINE HCL (PF) 0.5 % IJ SOLN
INTRAMUSCULAR | Status: DC | PRN
  Administered 2020-05-29: 10 mL

## 2020-05-29 MED ORDER — ONDANSETRON HCL 4 MG/2ML IJ SOLN
INTRAMUSCULAR | Status: AC
  Filled 2020-05-29: qty 2

## 2020-05-29 MED ORDER — MIDAZOLAM HCL 5 MG/5ML IJ SOLN
INTRAMUSCULAR | Status: DC | PRN
  Administered 2020-05-29: 2 mg via INTRAVENOUS

## 2020-05-29 MED ORDER — CEFAZOLIN SODIUM-DEXTROSE 2-4 GM/100ML-% IV SOLN
2.0000 g | INTRAVENOUS | Status: AC
  Administered 2020-05-29: 2 g via INTRAVENOUS

## 2020-05-29 MED ORDER — LIDOCAINE HCL (PF) 2 % IJ SOLN
INTRAMUSCULAR | Status: AC
  Filled 2020-05-29: qty 5

## 2020-05-29 MED ORDER — CHLORHEXIDINE GLUCONATE 0.12 % MT SOLN
15.0000 mL | Freq: Once | OROMUCOSAL | Status: AC
  Administered 2020-05-29: 15 mL via OROMUCOSAL

## 2020-05-29 MED ORDER — CEFAZOLIN SODIUM-DEXTROSE 2-4 GM/100ML-% IV SOLN
INTRAVENOUS | Status: AC
  Filled 2020-05-29: qty 100

## 2020-05-29 MED ORDER — LACTATED RINGERS IV SOLN: INTRAVENOUS | Status: DC

## 2020-05-29 MED ORDER — LIDOCAINE HCL (CARDIAC) PF 100 MG/5ML IV SOSY
PREFILLED_SYRINGE | INTRAVENOUS | Status: DC | PRN
  Administered 2020-05-29: 40 mg via INTRAVENOUS

## 2020-05-29 MED ORDER — ONDANSETRON HCL 4 MG/2ML IJ SOLN: 4.0000 mg | Freq: Once | INTRAMUSCULAR | Status: DC | PRN

## 2020-05-29 MED ORDER — DEXAMETHASONE SODIUM PHOSPHATE 10 MG/ML IJ SOLN
INTRAMUSCULAR | Status: DC | PRN
  Administered 2020-05-29: 5 mg via INTRAVENOUS

## 2020-05-29 MED ORDER — MIDAZOLAM HCL 2 MG/2ML IJ SOLN
INTRAMUSCULAR | Status: AC
  Filled 2020-05-29: qty 2

## 2020-05-29 MED ORDER — FENTANYL CITRATE (PF) 100 MCG/2ML IJ SOLN
INTRAMUSCULAR | Status: DC | PRN
  Administered 2020-05-29 (×2): 50 ug via INTRAVENOUS

## 2020-05-29 MED ORDER — PROPOFOL 10 MG/ML IV BOLUS
INTRAVENOUS | Status: DC | PRN
  Administered 2020-05-29: 200 mg via INTRAVENOUS

## 2020-05-29 MED ORDER — BUPIVACAINE HCL (PF) 0.5 % IJ SOLN
INTRAMUSCULAR | Status: AC
  Filled 2020-05-29: qty 30

## 2020-05-29 MED ORDER — ONDANSETRON HCL 4 MG/2ML IJ SOLN
INTRAMUSCULAR | Status: DC | PRN
  Administered 2020-05-29: 4 mg via INTRAVENOUS

## 2020-05-29 MED ORDER — PROPOFOL 10 MG/ML IV BOLUS
INTRAVENOUS | Status: AC
  Filled 2020-05-29: qty 20

## 2020-05-29 MED ORDER — CHLORHEXIDINE GLUCONATE 0.12 % MT SOLN
OROMUCOSAL | Status: AC
  Filled 2020-05-29: qty 15

## 2020-05-29 MED ORDER — ORAL CARE MOUTH RINSE: 15.0000 mL | Freq: Once | OROMUCOSAL | Status: AC

## 2020-05-29 SURGICAL SUPPLY — 39 items
APL PRP STRL LF DISP 70% ISPRP (MISCELLANEOUS) ×1
BANDAGE ESMARK 4X12 BL STRL LF (DISPOSABLE) ×1 IMPLANT
BNDG CMPR 12X4 ELC STRL LF (DISPOSABLE) ×1
BNDG CMPR STD VLCR NS LF 5.8X3 (GAUZE/BANDAGES/DRESSINGS) ×1
BNDG COHESIVE 4X5 TAN STRL (GAUZE/BANDAGES/DRESSINGS) ×2 IMPLANT
BNDG ELASTIC 3X5.8 VLCR NS LF (GAUZE/BANDAGES/DRESSINGS) ×2 IMPLANT
BNDG ESMARK 4X12 BLUE STRL LF (DISPOSABLE) ×2
BNDG GAUZE ELAST 4 BULKY (GAUZE/BANDAGES/DRESSINGS) ×2 IMPLANT
CHLORAPREP W/TINT 26 (MISCELLANEOUS) ×2 IMPLANT
CLOTH BEACON ORANGE TIMEOUT ST (SAFETY) ×2 IMPLANT
COVER LIGHT HANDLE STERIS (MISCELLANEOUS) ×4 IMPLANT
COVER WAND RF STERILE (DRAPES) ×2 IMPLANT
CUFF TOURN SGL QUICK 18X4 (TOURNIQUET CUFF) ×2 IMPLANT
DECANTER SPIKE VIAL GLASS SM (MISCELLANEOUS) ×2 IMPLANT
DRAPE C-ARM FOLDED MOBILE STRL (DRAPES) ×2 IMPLANT
DRAPE HALF SHEET 40X57 (DRAPES) ×2 IMPLANT
ELECT REM PT RETURN 9FT ADLT (ELECTROSURGICAL) ×2
ELECTRODE REM PT RTRN 9FT ADLT (ELECTROSURGICAL) ×1 IMPLANT
GAUZE SPONGE 4X4 12PLY STRL (GAUZE/BANDAGES/DRESSINGS) ×2 IMPLANT
GAUZE XEROFORM 5X9 LF (GAUZE/BANDAGES/DRESSINGS) ×2 IMPLANT
GLOVE SKINSENSE NS SZ8.0 LF (GLOVE) ×1
GLOVE SKINSENSE STRL SZ8.0 LF (GLOVE) ×1 IMPLANT
GLOVE SS N UNI LF 8.5 STRL (GLOVE) ×2 IMPLANT
GLOVE SURG POLYISO LF SZ7 (GLOVE) ×2 IMPLANT
GLOVE SURG UNDER POLY LF SZ7 (GLOVE) ×4 IMPLANT
GOWN STRL REUS W/TWL LRG LVL3 (GOWN DISPOSABLE) ×2 IMPLANT
GOWN STRL REUS W/TWL XL LVL3 (GOWN DISPOSABLE) ×2 IMPLANT
INST SET MINOR BONE (KITS) ×2 IMPLANT
KIT TURNOVER KIT A (KITS) ×2 IMPLANT
MANIFOLD NEPTUNE II (INSTRUMENTS) ×2 IMPLANT
NEEDLE HYPO 21X1.5 SAFETY (NEEDLE) ×2 IMPLANT
NS IRRIG 1000ML POUR BTL (IV SOLUTION) ×2 IMPLANT
PACK BASIC LIMB (CUSTOM PROCEDURE TRAY) ×2 IMPLANT
PAD ARMBOARD 7.5X6 YLW CONV (MISCELLANEOUS) ×2 IMPLANT
SET BASIN LINEN APH (SET/KITS/TRAYS/PACK) ×2 IMPLANT
SPONGE LAP 18X18 RF (DISPOSABLE) ×4 IMPLANT
SUT ETHILON 3 0 FSL (SUTURE) ×2 IMPLANT
SYR 30ML LL (SYRINGE) ×2 IMPLANT
SYR BULB IRRIG 60ML STRL (SYRINGE) ×4 IMPLANT

## 2020-05-29 NOTE — Op Note (Signed)
05/29/2020  1:07 PM  PATIENT:  Kylie Duffy  65 y.o. female  PRE-OPERATIVE DIAGNOSIS:  retained pins right wrist  POST-OPERATIVE DIAGNOSIS:  retained pins right wrist  PROCEDURE:  Procedure(s): PIN REMOVAL RIGHT WRIST (Right)  All pins were removed intact  Procedure note patient was seen in preop and cleared for surgery surgical site confirmed marked as right wrist taken to surgery for general anesthesia right hand prepped and draped sterilely  Timeout was performed  One of the pins was removed as it came through the skin with manipulation of the skin and the other 2 pins required incision.  Each pin was removed intact.  Each wound was irrigated with saline  X-rays confirmed all 3 pins were removed  Was irrigated and closed with 3-0 nylon for to the incisions the other incision was left open  Terrell dressing applied   SURGEON:  Surgeon(s) and Role:    * Carole Civil, MD - Primary  PHYSICIAN ASSISTANT:   ASSISTANTS: none   ANESTHESIA:   general  EBL:  10 mL   BLOOD ADMINISTERED:none  DRAINS: none   LOCAL MEDICATIONS USED:  MARCAINE     SPECIMEN:  No Specimen  DISPOSITION OF SPECIMEN:  N/A  COUNTS:  YES  TOURNIQUET:  * Missing tourniquet times found for documented tourniquets in log: [id number] *  DICTATION: .Dragon Dictation  PLAN OF CARE: Discharge to home after PACU  PATIENT DISPOSITION:  PACU - hemodynamically stable.   Delay start of Pharmacological VTE agent (>24hrs) due to surgical blood loss or risk of bleeding: not applicable

## 2020-05-29 NOTE — Anesthesia Postprocedure Evaluation (Signed)
Anesthesia Post Note  Patient: Kylie Duffy  Procedure(s) Performed: PIN REMOVAL RIGHT WRIST (Right )  Patient location during evaluation: PACU Anesthesia Type: General Level of consciousness: awake and alert and oriented Pain management: pain level controlled Vital Signs Assessment: post-procedure vital signs reviewed and stable Respiratory status: spontaneous breathing and respiratory function stable Cardiovascular status: stable and blood pressure returned to baseline Postop Assessment: no apparent nausea or vomiting Anesthetic complications: no   No complications documented.   Last Vitals:  Vitals:   05/29/20 1117 05/29/20 1315  BP: 112/81 110/67  Pulse: 70 80  Resp: 11 18  Temp: 36.6 C 36.4 C  SpO2: 100% 97%    Last Pain:  Vitals:   05/29/20 1315  TempSrc:   PainSc: Asleep                 Caron Tardif C Samy Ryner

## 2020-05-29 NOTE — Progress Notes (Signed)
Incentive spirometry order noted - Pt. States she already has a spirometer at home from previous surgery - understands use

## 2020-05-29 NOTE — Transfer of Care (Signed)
Immediate Anesthesia Transfer of Care Note  Patient: Kylie Duffy  Procedure(s) Performed: PIN REMOVAL RIGHT WRIST (Right )  Patient Location: PACU  Anesthesia Type:General  Level of Consciousness: awake, alert , oriented and patient cooperative  Airway & Oxygen Therapy: Patient Spontanous Breathing  Post-op Assessment: Report given to RN, Post -op Vital signs reviewed and stable and Patient moving all extremities X 4  Post vital signs: Reviewed and stable  Last Vitals:  Vitals Value Taken Time  BP 110/67 05/29/20 1315  Temp    Pulse 80 05/29/20 1315  Resp 18 05/29/20 1315  SpO2 97 % 05/29/20 1315  Vitals shown include unvalidated device data.  Last Pain:  Vitals:   05/29/20 1117  TempSrc: Oral  PainSc: 0-No pain         Complications: No complications documented.

## 2020-05-29 NOTE — Interval H&P Note (Signed)
History and Physical Interval Note:  05/29/2020 12:23 PM  Kylie Duffy  has presented today for surgery, with the diagnosis of retained pins right wrist.  The various methods of treatment have been discussed with the patient and family. After consideration of risks, benefits and other options for treatment, the patient has consented to  Procedure(s): PIN REMOVAL RIGHT WRIST (Right) as a surgical intervention.  The patient's history has been reviewed, patient examined, no change in status, stable for surgery.  I have reviewed the patient's chart and labs.  Questions were answered to the patient's satisfaction.     Arther Abbott

## 2020-05-29 NOTE — Anesthesia Procedure Notes (Signed)
Procedure Name: LMA Insertion Date/Time: 05/29/2020 12:36 PM Performed by: Jonna Munro, CRNA Pre-anesthesia Checklist: Patient identified, Emergency Drugs available, Suction available, Patient being monitored and Timeout performed Patient Re-evaluated:Patient Re-evaluated prior to induction Oxygen Delivery Method: Circle system utilized Preoxygenation: Pre-oxygenation with 100% oxygen Induction Type: IV induction LMA: LMA inserted LMA Size: 4.0 Number of attempts: 1 Placement Confirmation: positive ETCO2 and breath sounds checked- equal and bilateral Tube secured with: Tape Dental Injury: Teeth and Oropharynx as per pre-operative assessment

## 2020-05-29 NOTE — Discharge Instructions (Signed)

## 2020-05-29 NOTE — Brief Op Note (Signed)
05/29/2020  1:07 PM  PATIENT:  Kylie Duffy  65 y.o. female  PRE-OPERATIVE DIAGNOSIS:  retained pins right wrist  POST-OPERATIVE DIAGNOSIS:  retained pins right wrist  PROCEDURE:  Procedure(s): PIN REMOVAL RIGHT WRIST (Right)  All pins were removed intact  Procedure note patient was seen in preop and cleared for surgery surgical site confirmed marked as right wrist taken to surgery for general anesthesia right hand prepped and draped sterilely  Timeout was performed  One of the pins was removed as it came through the skin with manipulation of the skin and the other 2 pins required incision.  Each pin was removed intact.  Each wound was irrigated with saline  X-rays confirmed all 3 pins were removed  Was irrigated and closed with 3-0 nylon for to the incisions the other incision was left open  Terrell dressing applied   SURGEON:  Surgeon(s) and Role:    * Nikkia Devoss E, MD - Primary  PHYSICIAN ASSISTANT:   ASSISTANTS: none   ANESTHESIA:   general  EBL:  10 mL   BLOOD ADMINISTERED:none  DRAINS: none   LOCAL MEDICATIONS USED:  MARCAINE     SPECIMEN:  No Specimen  DISPOSITION OF SPECIMEN:  N/A  COUNTS:  YES  TOURNIQUET:  * Missing tourniquet times found for documented tourniquets in log: [id number] *  DICTATION: .Dragon Dictation  PLAN OF CARE: Discharge to home after PACU  PATIENT DISPOSITION:  PACU - hemodynamically stable.   Delay start of Pharmacological VTE agent (>24hrs) due to surgical blood loss or risk of bleeding: not applicable  

## 2020-05-29 NOTE — Anesthesia Preprocedure Evaluation (Addendum)
Anesthesia Evaluation  Patient identified by MRN, date of birth, ID band Patient awake    Reviewed: Allergy & Precautions, NPO status , Patient's Chart, lab work & pertinent test results  History of Anesthesia Complications (+) PONV and history of anesthetic complications  Airway Mallampati: II  TM Distance: <3 FB Neck ROM: Full    Dental  (+) Dental Advisory Given, Teeth Intact Crowns :   Pulmonary    Pulmonary exam normal breath sounds clear to auscultation       Cardiovascular Exercise Tolerance: Good Normal cardiovascular exam Rhythm:Regular Rate:Normal     Neuro/Psych  Headaches, negative psych ROS   GI/Hepatic negative GI ROS, Neg liver ROS,   Endo/Other  negative endocrine ROS  Renal/GU negative Renal ROS     Musculoskeletal  (+) Arthritis ,   Abdominal   Peds  Hematology negative hematology ROS (+)   Anesthesia Other Findings   Reproductive/Obstetrics                           Anesthesia Physical Anesthesia Plan  ASA: II  Anesthesia Plan: General   Post-op Pain Management:    Induction: Intravenous  PONV Risk Score and Plan: 4 or greater and Ondansetron, Dexamethasone and Midazolam  Airway Management Planned: LMA  Additional Equipment:   Intra-op Plan:   Post-operative Plan: Extubation in OR  Informed Consent: I have reviewed the patients History and Physical, chart, labs and discussed the procedure including the risks, benefits and alternatives for the proposed anesthesia with the patient or authorized representative who has indicated his/her understanding and acceptance.     Dental advisory given  Plan Discussed with: CRNA and Surgeon  Anesthesia Plan Comments:         Anesthesia Quick Evaluation

## 2020-05-30 ENCOUNTER — Encounter (HOSPITAL_COMMUNITY): Payer: Self-pay | Admitting: Orthopedic Surgery

## 2020-05-31 ENCOUNTER — Other Ambulatory Visit: Payer: Self-pay

## 2020-05-31 ENCOUNTER — Ambulatory Visit (INDEPENDENT_AMBULATORY_CARE_PROVIDER_SITE_OTHER): Payer: 59 | Admitting: Orthopedic Surgery

## 2020-05-31 ENCOUNTER — Encounter: Payer: Self-pay | Admitting: Orthopedic Surgery

## 2020-05-31 DIAGNOSIS — Z4889 Encounter for other specified surgical aftercare: Secondary | ICD-10-CM

## 2020-05-31 DIAGNOSIS — S52531D Colles' fracture of right radius, subsequent encounter for closed fracture with routine healing: Secondary | ICD-10-CM

## 2020-05-31 NOTE — Progress Notes (Signed)
Chief Complaint  Patient presents with  . Wrist Pain  . Wound Check    Pin removal on 05/29/20, dressing off, incision c&d, no real pain   Encounter Diagnoses  Name Primary?  . Closed Colles' fracture of right radius with routine healing, subsequent encounter 02/24/20 Yes  . Aftercare following surgery     WOUND CK  ALL 3 LOOK GOOD SHE HAS NO PAIN OR DRAINAGE  SHE CAN APPLY NEOSPORIN  FU FOR SUTURES OUT ON THE 16TH

## 2020-06-05 ENCOUNTER — Encounter (HOSPITAL_COMMUNITY): Payer: Self-pay | Admitting: Specialist

## 2020-06-05 ENCOUNTER — Other Ambulatory Visit: Payer: Self-pay

## 2020-06-05 ENCOUNTER — Ambulatory Visit (HOSPITAL_COMMUNITY): Payer: 59 | Attending: Orthopedic Surgery | Admitting: Specialist

## 2020-06-05 DIAGNOSIS — M25631 Stiffness of right wrist, not elsewhere classified: Secondary | ICD-10-CM | POA: Diagnosis not present

## 2020-06-05 DIAGNOSIS — R29898 Other symptoms and signs involving the musculoskeletal system: Secondary | ICD-10-CM | POA: Diagnosis not present

## 2020-06-05 DIAGNOSIS — M25641 Stiffness of right hand, not elsewhere classified: Secondary | ICD-10-CM | POA: Insufficient documentation

## 2020-06-05 DIAGNOSIS — M25531 Pain in right wrist: Secondary | ICD-10-CM | POA: Diagnosis not present

## 2020-06-05 NOTE — Therapy (Addendum)
[facility] [place], [place], [address] Phone: [phone]   Fax:  [phone]  Occupational Therapy Treatment  Patient Details  Name: Kylie Duffy MRN: [id number] Date of Birth: 1955-03-20 Referring Provider (OT): Dr. Arther Abbott   Encounter Date: 06/05/2020   OT End of Session - 06/05/20 2054    Visit Number 10    Number of Visits 20    Date for OT Re-Evaluation 06/29/20    Authorization Type UMR, $20 copa    Authorization Time Period no visit limit    OT Start Time 1610    OT Stop Time 1645    OT Time Calculation (min) 35 min    Activity Tolerance Patient tolerated treatment well    Behavior During Therapy [facility] for tasks assessed/performed           Past Medical History:  Diagnosis Date  . Burning with urination 11/01/2014  . Hematuria 07/28/2013  . History of UTI   . Hyperlipidemia   . Osteopenia   . PONV (postoperative nausea and vomiting)   . Vaginal dryness 12/03/2012    Past Surgical History:  Procedure Laterality Date  . ABDOMINAL HYSTERECTOMY    . BREAST BIOPSY Left   . CLOSED REDUCTION WRIST FRACTURE Right 02/24/2020   Procedure: CLOSED REDUCTION WRIST with pin placement;  Surgeon: Carole Civil, MD;  [facility]: [facility];  Service: Orthopedics;  Laterality: Right;  . COLONOSCOPY N/A 08/25/2016   Procedure: COLONOSCOPY;  Surgeon: Danie Binder, MD;  [facility];  Service: Endoscopy;  Laterality: N/A;  . DIAGNOSTIC LAPAROSCOPY    . HARDWARE REMOVAL Right 05/29/2020   Procedure: PIN REMOVAL RIGHT WRIST;  Surgeon: Carole Civil, MD;  Location: [facility];  Service: Orthopedics;  Laterality: Right;  . KNEE ARTHROSCOPY WITH MEDIAL MENISECTOMY  01/26/2012   Procedure: KNEE ARTHROSCOPY WITH MEDIAL MENISECTOMY;  Surgeon: Carole Civil, MD;  Location: [facility];  Service: Orthopedics;  Laterality: Left;  . POLYPECTOMY  08/25/2016   Procedure: POLYPECTOMY;  Surgeon: Danie Binder, MD;   [facility];  Service: Endoscopy;;  . TONSILLECTOMY      There were no vitals filed for this visit.   Subjective Assessment - 06/05/20 2053    Subjective  S:  I had the pins removed on 05/29/20, I will have the stitches removed on the 13th.    Currently in Pain? Yes    Pain Score 3     Pain Location Wrist    Pain Orientation Right    Pain Descriptors / Indicators Aching              [facility] OT Assessment - 06/05/20 0001      Assessment   Medical Diagnosis s/p right radius fx with percutaneous pinning    Referring Provider (OT) Dr. Arther Abbott    Onset Date/Surgical Date 02/13/20    Next MD Visit 06/11/20      Precautions   Precautions None      AROM   Right Forearm Pronation 90 Degrees   90   Right Forearm Supination 60 Degrees   53   Right Wrist Extension 30 Degrees   40   Right Wrist Flexion 52 Degrees   45     Strength   Right Forearm Pronation 4+/5    Right Forearm Supination 4/5    Right Wrist Flexion 4+/5    Right Wrist Extension 4+/5    Right Wrist Radial Deviation 4/5  Right Wrist Ulnar Deviation 4/5    Right Hand Grip (lbs) 20   18   Right Hand Lateral Pinch 12 lbs   10   Right Hand 3 Point Pinch 9 lbs   6   Left Hand Grip (lbs) 40    Left Hand Lateral Pinch 16 lbs    Left Hand 3 Point Pinch 12 lbs                    OT Treatments/Exercises (OP) - 06/05/20 0001      Exercises   Exercises Wrist;Hand;Theraputty      Wrist Exercises   Other wrist exercises forearm supination/pronation, wrist flexion/extension, P/ROM 5X, A/ROM, 10X    Other wrist exercises velcro board-supination/pronation 4x      Hand Exercises   Other Hand Exercises attempted gripper set at 20# which was too easy for patient, increased to 35# resistance and was able to pick up large beads (10) with max difficulty.      Manual Therapy   Manual Therapy Myofascial release    Manual therapy comments completed separately from therapeutic exercises     Myofascial Release myofascial release to dorsal hand, wrist, and forearm to decrease fascial restrictions and muscle tightness, decrease pain, and increase joint ROM      Fine Motor Coordination (Hand/Wrist)   Fine Motor Coordination Picking up coins;Grooved pegs    Picking up coins picked up pennies from table and placed in bank held vertical in front of patient simulating a vending machine x 15 times for improved coordination and wrist extension    Grooved pegs completed grooved peg board and was able to place all pegs with forearm resting on table.  patient able to remove 1 by 1 and translate to palm and then back to fingerips to place on table.                    OT Short Term Goals - 05/18/20 1147      OT SHORT TERM GOAL #1   Title Patient will be educated on and independent with HEP to improve mobility required for ADL completion using RUE.    Time 4    Period Weeks    Status On-going    Target Date 05/24/20      OT SHORT TERM GOAL #2   Title Pt will decrease pain in RUE to 3/10 or less to improve ability to complete cooking tasks with minimal compensatory strategies.    Time 4    Period Weeks    Status Achieved      OT SHORT TERM GOAL #3   Title Pt will increase right wrist A/ROM to [facility] to improve ability to perform dressing and bathing tasks.    Time 4    Period Weeks    Status On-going      OT SHORT TERM GOAL #4   Title Pt will increase right wrist strength to 4+/5 or greater to improve ability to perform lifting tasks at home and work.    Time 4    Period Weeks    Status On-going      OT SHORT TERM GOAL #5   Title Pt will increase right grip strength by 20# and pinch strength by 5# to improve ability to grasp and maintain hold on items/tools during functional use.    Time 4    Period Weeks    Status On-going  Plan - 06/05/20 2054    Clinical Impression Statement A:  Patient is s/p pin removal from right wrist pinning s/p  fracture.  She has made improvements in her a/rom, with the exception of extension.  Grip and pinch strength also improving, although not to PLOF.  Patient able to tolerate P/ROM with greater range this date.    Plan P:  attempt weighted forearm and wrist stretch, continue to improve grip and pinch strength to WNL for return to full use of her RUE as dominant.  Improve supination to [facility] in order to open doors with increased ease.           Patient will benefit from skilled therapeutic intervention in order to improve the following deficits and impairments:           Visit Diagnosis: Other symptoms and signs involving the musculoskeletal system  Stiffness of right wrist, not elsewhere classified  Pain in right wrist  Stiffness of right hand, not elsewhere classified    Problem List Patient Active Problem List   Diagnosis Date Noted  . Closed fracture distal radius and ulna, right, sequela   . Encounter for screening fecal occult blood testing 04/25/2020  . Encounter for well woman exam with routine gynecological exam 04/25/2020  . Fracture, Colles, right, closed 02/29/2020  . Closed fracture of right wrist   . Thrombocytosis 07/19/2019  . Well woman exam with routine gynecological exam 02/11/2018  . Screening for colorectal cancer 02/11/2018  . Pituitary abnormality ([place]) 12/26/2016  . Hyperlipidemia 06/30/2016  . Osteopenia 01/04/2014  . Migraines 01/04/2014  . Medial meniscus, posterior horn derangement 01/29/2012  . Chondromalacia of patella, left 01/29/2012    Vangie Bicker, [place], OTR/L [phone]  06/05/2020, 9:18 PM  [facility] [address] [place], [place], [address] Phone: [phone]   Fax:  [phone]  Name: Kylie Duffy MRN: [id number] Date of Birth: 11-Dec-1955

## 2020-06-06 ENCOUNTER — Ambulatory Visit (HOSPITAL_COMMUNITY): Payer: 59 | Admitting: Specialist

## 2020-06-06 DIAGNOSIS — R29898 Other symptoms and signs involving the musculoskeletal system: Secondary | ICD-10-CM

## 2020-06-06 DIAGNOSIS — M25631 Stiffness of right wrist, not elsewhere classified: Secondary | ICD-10-CM | POA: Diagnosis not present

## 2020-06-06 DIAGNOSIS — M25641 Stiffness of right hand, not elsewhere classified: Secondary | ICD-10-CM | POA: Diagnosis not present

## 2020-06-06 DIAGNOSIS — M25531 Pain in right wrist: Secondary | ICD-10-CM | POA: Diagnosis not present

## 2020-06-06 NOTE — Therapy (Signed)
Kylie Duffy [address] [place], [place], [address] Phone: [phone]   Fax:  [phone]  Occupational Therapy Treatment  Patient Details  Name: Kylie Duffy MRN: [id number] Date of Birth: 1955-03-03 Referring Provider (OT): Dr. Arther Abbott   Encounter Date: 06/06/2020    Past Medical History:  Diagnosis Date  . Burning with urination 11/01/2014  . Hematuria 07/28/2013  . History of UTI   . Hyperlipidemia   . Osteopenia   . PONV (postoperative nausea and vomiting)   . Vaginal dryness 12/03/2012    Past Surgical History:  Procedure Laterality Date  . ABDOMINAL HYSTERECTOMY    . BREAST BIOPSY Left   . CLOSED REDUCTION WRIST FRACTURE Right 02/24/2020   Procedure: CLOSED REDUCTION WRIST with pin placement;  Surgeon: Carole Civil, MD;  [facility];  Service: Orthopedics;  Laterality: Right;  . COLONOSCOPY N/A 08/25/2016   Procedure: COLONOSCOPY;  Surgeon: Danie Binder, MD;  [facility];  Service: Endoscopy;  Laterality: N/A;  . DIAGNOSTIC LAPAROSCOPY    . HARDWARE REMOVAL Right 05/29/2020   Procedure: PIN REMOVAL RIGHT WRIST;  Surgeon: Carole Civil, MD;  [facility];  Service: Orthopedics;  Laterality: Right;  . KNEE ARTHROSCOPY WITH MEDIAL MENISECTOMY  01/26/2012   Procedure: KNEE ARTHROSCOPY WITH MEDIAL MENISECTOMY;  Surgeon: Carole Civil, MD;  [facility];  Service: Orthopedics;  Laterality: Left;  . POLYPECTOMY  08/25/2016   Procedure: POLYPECTOMY;  Surgeon: Danie Binder, MD;  [facility];  Service: Endoscopy;;  . TONSILLECTOMY      There were no vitals filed for this visit.       [facility] OT Assessment - 06/06/20 0001      Assessment   Medical Diagnosis s/p right radius fx with percutaneous pinning    Referring Provider (OT) Dr. Arther Abbott      Precautions   Precautions None                    OT Treatments/Exercises (OP) - 06/06/20 0001       Exercises   Exercises Wrist;Hand;Theraputty      Weighted Stretch Over Towel Roll   Supination - Weighted Stretch 1 pound;60 seconds    Pronation - Weighted Stretch 1 pound;60 seconds    Wrist Flexion - Weighted Stretch 1 pound;60 seconds    Wrist Extension - Weighted Stretch 1 pound;60 seconds      Wrist Exercises   Other wrist exercises forearm supination/pronation, wrist flexion/extension, P/ROM 5X, A/ROM, 10X      Hand Exercises   Other Hand Exercises in hand manipulation with beads able to translate 10 beads from fingertips to palm and back to fingertips without difficulty.      Theraputty   Theraputty - Flatten with red putty in standing, completed sustained wrist pull x 5 with PVC    Theraputty - Roll red      Manual Therapy   Manual Therapy Myofascial release    Manual therapy comments completed separately from therapeutic exercises    Myofascial Release myofascial release to dorsal hand, wrist, and forearm to decrease fascial restrictions and muscle tightness, decrease pain, and increase joint ROM                    OT Short Term Goals - 05/18/20 1147      OT SHORT TERM GOAL #1   Title Patient will be educated on and independent with HEP  to improve mobility required for ADL completion using RUE.    Time 4    Period Weeks    Status On-going    Target Date 05/24/20      OT SHORT TERM GOAL #2   Title Pt will decrease pain in RUE to 3/10 or less to improve ability to complete cooking tasks with minimal compensatory strategies.    Time 4    Period Weeks    Status Achieved      OT SHORT TERM GOAL #3   Title Pt will increase right wrist A/ROM to [facility] to improve ability to perform dressing and bathing tasks.    Time 4    Period Weeks    Status On-going      OT SHORT TERM GOAL #4   Title Pt will increase right wrist strength to 4+/5 or greater to improve ability to perform lifting tasks at home and work.    Time 4    Period Weeks    Status On-going       OT SHORT TERM GOAL #5   Title Pt will increase right grip strength by 20# and pinch strength by 5# to improve ability to grasp and maintain hold on items/tools during functional use.    Time 4    Period Weeks    Status On-going                    Plan - 06/06/20 1637    Clinical Impression Statement A:  patient continues to demonstrate improvement in active supination this date.  added weighed sustained stretch for forearm and wrist, with extension and supination being most difficult.  all movements are much more fluid and natural with less guarding of her wrist movement.    Body Structure / Function / Physical Skills ADL;Endurance;UE functional use;Fascial restriction;Pain;ROM;IADL;Strength    Plan P:  attempt strengthening of wrist and forearm.  add wall wrist stretch or prayer stretch to HEP.           Patient will benefit from skilled therapeutic intervention in order to improve the following deficits and impairments:   Body Structure / Function / Physical Skills: ADL,Endurance,UE functional use,Fascial restriction,Pain,ROM,IADL,Strength       Visit Diagnosis: Other symptoms and signs involving the musculoskeletal system  Stiffness of right wrist, not elsewhere classified  Pain in right wrist    Problem List Patient Active Problem List   Diagnosis Date Noted  . Closed fracture distal radius and ulna, right, sequela   . Encounter for screening fecal occult blood testing 04/25/2020  . Encounter for well woman exam with routine gynecological exam 04/25/2020  . Fracture, Colles, right, closed 02/29/2020  . Closed fracture of right wrist   . Thrombocytosis 07/19/2019  . Well woman exam with routine gynecological exam 02/11/2018  . Screening for colorectal cancer 02/11/2018  . Pituitary abnormality ([place]) 12/26/2016  . Hyperlipidemia 06/30/2016  . Osteopenia 01/04/2014  . Migraines 01/04/2014  . Medial meniscus, posterior horn derangement 01/29/2012  .  Chondromalacia of patella, left 01/29/2012    Kylie Duffy, [place], OTR/L [phone]  06/06/2020, 4:43 PM  [facility] [address] [place], [place], [address] Phone: [phone]   Fax:  [phone]  Name: Kylie Duffy MRN: [id number] Date of Birth: 03-Oct-1955

## 2020-06-07 ENCOUNTER — Other Ambulatory Visit (HOSPITAL_COMMUNITY): Payer: 59

## 2020-06-11 ENCOUNTER — Other Ambulatory Visit: Payer: Self-pay

## 2020-06-11 ENCOUNTER — Encounter: Payer: Self-pay | Admitting: Orthopedic Surgery

## 2020-06-11 ENCOUNTER — Ambulatory Visit (INDEPENDENT_AMBULATORY_CARE_PROVIDER_SITE_OTHER): Payer: 59 | Admitting: Orthopedic Surgery

## 2020-06-11 DIAGNOSIS — S52531D Colles' fracture of right radius, subsequent encounter for closed fracture with routine healing: Secondary | ICD-10-CM

## 2020-06-11 DIAGNOSIS — Z4889 Encounter for other specified surgical aftercare: Secondary | ICD-10-CM

## 2020-06-11 NOTE — Progress Notes (Signed)
Postop status post removal of hardware right wrist  Initial surgery was February 24, 2020  Hardware removal was May 29, 2020   postsurgical wounds look to be intact without infection  Patient will continue therapy and see me in 2 months

## 2020-06-19 ENCOUNTER — Other Ambulatory Visit: Payer: Self-pay

## 2020-06-19 ENCOUNTER — Ambulatory Visit (HOSPITAL_COMMUNITY): Payer: 59 | Admitting: Occupational Therapy

## 2020-06-19 ENCOUNTER — Encounter (HOSPITAL_COMMUNITY): Payer: Self-pay | Admitting: Occupational Therapy

## 2020-06-19 DIAGNOSIS — R29898 Other symptoms and signs involving the musculoskeletal system: Secondary | ICD-10-CM | POA: Diagnosis not present

## 2020-06-19 DIAGNOSIS — M25641 Stiffness of right hand, not elsewhere classified: Secondary | ICD-10-CM | POA: Diagnosis not present

## 2020-06-19 DIAGNOSIS — M25531 Pain in right wrist: Secondary | ICD-10-CM | POA: Diagnosis not present

## 2020-06-19 DIAGNOSIS — M25631 Stiffness of right wrist, not elsewhere classified: Secondary | ICD-10-CM | POA: Diagnosis not present

## 2020-06-19 NOTE — Therapy (Signed)
[facility] [address] [place], [place], [address] Phone: [phone]   Fax:  [phone]  Occupational Therapy Treatment  Patient Details  Name: Kylie Duffy MRN: [id number] Date of Birth: 1955-11-28 Referring Provider (OT): Dr. Arther Abbott   Encounter Date: 06/19/2020   OT End of Session - 06/19/20 1642    Visit Number 12    Number of Visits 20    Date for OT Re-Evaluation 06/29/20    Authorization Type UMR, $20 copa    Authorization Time Period no visit limit    OT Start Time 1607    OT Stop Time 1645    OT Time Calculation (min) 38 min    Activity Tolerance Patient tolerated treatment well    Behavior During Therapy [facility] for tasks assessed/performed           Past Medical History:  Diagnosis Date  . Burning with urination 11/01/2014  . Hematuria 07/28/2013  . History of UTI   . Hyperlipidemia   . Osteopenia   . PONV (postoperative nausea and vomiting)   . Vaginal dryness 12/03/2012    Past Surgical History:  Procedure Laterality Date  . ABDOMINAL HYSTERECTOMY    . BREAST BIOPSY Left   . CLOSED REDUCTION WRIST FRACTURE Right 02/24/2020   Procedure: CLOSED REDUCTION WRIST with pin placement;  Surgeon: Carole Civil, MD;  Location: [facility];  Service: Orthopedics;  Laterality: Right;  . COLONOSCOPY N/A 08/25/2016   Procedure: COLONOSCOPY;  Surgeon: Danie Binder, MD;  [facility];  Service: Endoscopy;  Laterality: N/A;  . DIAGNOSTIC LAPAROSCOPY    . HARDWARE REMOVAL Right 05/29/2020   Procedure: PIN REMOVAL RIGHT WRIST;  Surgeon: Carole Civil, MD;  Location: [facility];  Service: Orthopedics;  Laterality: Right;  . KNEE ARTHROSCOPY WITH MEDIAL MENISECTOMY  01/26/2012   Procedure: KNEE ARTHROSCOPY WITH MEDIAL MENISECTOMY;  Surgeon: Carole Civil, MD;  Location: [facility];  Service: Orthopedics;  Laterality: Left;  . POLYPECTOMY  08/25/2016   Procedure: POLYPECTOMY;  Surgeon: Danie Binder, MD;   [facility];  Service: Endoscopy;;  . TONSILLECTOMY      There were no vitals filed for this visit.   Subjective Assessment - 06/19/20 1608    Subjective  S: it's feeling good.    Currently in Pain? No/denies              [facility] OT Assessment - 06/19/20 1608      Assessment   Medical Diagnosis s/p right radius fx with percutaneous pinning      Precautions   Precautions None                    OT Treatments/Exercises (OP) - 06/19/20 1608      Exercises   Exercises Wrist;Hand;Theraputty      Weighted Stretch Over Towel Roll   Supination - Weighted Stretch 2 pounds;60 seconds      Wrist Exercises   Wrist Flexion PROM;5 reps;Strengthening;15 reps    Bar Weights/Barbell (Wrist Flexion) 1 lb    Wrist Extension PROM;5 reps;Strengthening;15 reps    Bar Weights/Barbell (Wrist Extension) 1 lb    Wrist Radial Deviation PROM;5 reps;Strengthening;15 reps    Bar Weights/Barbell (Radial Deviation) 1 lb    Wrist Ulnar Deviation PROM;5 reps;Strengthening;15 reps    Bar Weights/Barbell (Ulnar Deviation) 1 lb    Other wrist exercises forearm supination/pronation, 1#, 15X    Other wrist exercises prayer stretch for wrist  extension: 2x20"      Additional Wrist Exercises   Hand Gripper with Large Beads all beads gripper at 35#, vertical    Hand Gripper with Medium Beads all beads gripper at 25#, vertical    Hand Gripper with Small Beads all beads gripper at 25#, vertical      Hand Exercises   Other Hand Exercises pt using pvc pipe to cut circles into red theraputty, working on wrist flexion and extension and sustained grip      Theraputty   Theraputty - Flatten red-standing    Theraputty - Roll red    Theraputty - Grip grip-supinated and pronated      Manual Therapy   Manual Therapy Myofascial release    Manual therapy comments completed separately from therapeutic exercises    Soft tissue mobilization myofascial release to dorsal hand, wrist, and forearm  to decrease fascial restrictions and muscle tightness, decrease pain, and increase joint ROM                    OT Short Term Goals - 05/18/20 1147      OT SHORT TERM GOAL #1   Title Patient will be educated on and independent with HEP to improve mobility required for ADL completion using RUE.    Time 4    Period Weeks    Status On-going    Target Date 05/24/20      OT SHORT TERM GOAL #2   Title Pt will decrease pain in RUE to 3/10 or less to improve ability to complete cooking tasks with minimal compensatory strategies.    Time 4    Period Weeks    Status Achieved      OT SHORT TERM GOAL #3   Title Pt will increase right wrist A/ROM to [facility] to improve ability to perform dressing and bathing tasks.    Time 4    Period Weeks    Status On-going      OT SHORT TERM GOAL #4   Title Pt will increase right wrist strength to 4+/5 or greater to improve ability to perform lifting tasks at home and work.    Time 4    Period Weeks    Status On-going      OT SHORT TERM GOAL #5   Title Pt will increase right grip strength by 20# and pinch strength by 5# to improve ability to grasp and maintain hold on items/tools during functional use.    Time 4    Period Weeks    Status On-going                    Plan - 06/19/20 1636    Clinical Impression Statement A: Continued with passive stretching, progressed to strengthening using 1# weight. Increased to 2# weight for sustained supination stretch. Resumed gripper task increasing resistance to 35# for large beads and 25# for medium and small beads. Added red theraputty task using pvc pipe to cut circles, working on flexion/extension, rest breaks provided as needed for fatigue. Added theraputty grip tasks and prayer stretch as well. Verbal cuing for form and technique.    Body Structure / Function / Physical Skills ADL;Endurance;UE functional use;Fascial restriction;Pain;ROM;IADL;Strength    Plan P: Continue with strengthening  progressing to 2# weight    OT Home Exercise Plan eval: wrist A/ROM, yellow theraputty for grip and pinch 4/6: Wrist stretches    Consulted and Agree with Plan of Care Patient  Patient will benefit from skilled therapeutic intervention in order to improve the following deficits and impairments:   Body Structure / Function / Physical Skills: ADL,Endurance,UE functional use,Fascial restriction,Pain,ROM,IADL,Strength       Visit Diagnosis: Other symptoms and signs involving the musculoskeletal system  Stiffness of right wrist, not elsewhere classified  Pain in right wrist  Stiffness of right hand, not elsewhere classified    Problem List Patient Active Problem List   Diagnosis Date Noted  . Closed fracture distal radius and ulna, right, sequela   . Encounter for screening fecal occult blood testing 04/25/2020  . Encounter for well woman exam with routine gynecological exam 04/25/2020  . Fracture, Colles, right, closed 02/29/2020  . Closed fracture of right wrist   . Thrombocytosis 07/19/2019  . Well woman exam with routine gynecological exam 02/11/2018  . Screening for colorectal cancer 02/11/2018  . Pituitary abnormality ([place]) 12/26/2016  . Hyperlipidemia 06/30/2016  . Osteopenia 01/04/2014  . Migraines 01/04/2014  . Medial meniscus, posterior horn derangement 01/29/2012  . Chondromalacia of patella, left 01/29/2012   Guadelupe Sabin, OTR/L  [phone] 06/19/2020, 4:45 PM  [place], [place], [address] Phone: [phone]   Fax:  [phone]  Name: Kylie Duffy MRN: [id number] Date of Birth: 1955/06/17

## 2020-06-21 ENCOUNTER — Other Ambulatory Visit: Payer: Self-pay

## 2020-06-21 ENCOUNTER — Encounter: Payer: Self-pay | Admitting: Adult Health

## 2020-06-21 ENCOUNTER — Ambulatory Visit (HOSPITAL_COMMUNITY): Payer: 59 | Admitting: Occupational Therapy

## 2020-06-21 ENCOUNTER — Other Ambulatory Visit (HOSPITAL_COMMUNITY): Payer: Self-pay

## 2020-06-21 ENCOUNTER — Encounter (HOSPITAL_COMMUNITY): Payer: Self-pay | Admitting: Occupational Therapy

## 2020-06-21 ENCOUNTER — Ambulatory Visit: Payer: 59 | Admitting: Adult Health

## 2020-06-21 VITALS — BP 133/81 | HR 74 | Temp 97.9°F | Ht 62.5 in | Wt 127.0 lb

## 2020-06-21 DIAGNOSIS — M25531 Pain in right wrist: Secondary | ICD-10-CM | POA: Diagnosis not present

## 2020-06-21 DIAGNOSIS — R29898 Other symptoms and signs involving the musculoskeletal system: Secondary | ICD-10-CM

## 2020-06-21 DIAGNOSIS — M25641 Stiffness of right hand, not elsewhere classified: Secondary | ICD-10-CM

## 2020-06-21 DIAGNOSIS — R3915 Urgency of urination: Secondary | ICD-10-CM

## 2020-06-21 DIAGNOSIS — N390 Urinary tract infection, site not specified: Secondary | ICD-10-CM | POA: Diagnosis not present

## 2020-06-21 DIAGNOSIS — R319 Hematuria, unspecified: Secondary | ICD-10-CM

## 2020-06-21 DIAGNOSIS — M25631 Stiffness of right wrist, not elsewhere classified: Secondary | ICD-10-CM

## 2020-06-21 LAB — POCT URINALYSIS DIPSTICK
Glucose, UA: NEGATIVE
Ketones, UA: NEGATIVE
Nitrite, UA: NEGATIVE
Protein, UA: NEGATIVE

## 2020-06-21 MED ORDER — CIPROFLOXACIN HCL 500 MG PO TABS
500.0000 mg | ORAL_TABLET | Freq: Two times a day (BID) | ORAL | 0 refills | Status: DC
  Filled 2020-06-21: qty 10, 5d supply, fill #0

## 2020-06-21 NOTE — Progress Notes (Signed)
  Subjective:     Patient ID: Kylie Duffy, female   DOB: Aug 27, 1955, 65 y.o.   MRN: [id number]  HPI [place] is a 65 year old white female,married, sp hysterectomy worked in for urinary frequency, pressure and nausea, started yesterday and urine was cloudy she says. She had keflex recently prior to having pins removed from wrist.  PCP is Dr Dennard Schaumann.  Review of Systems +urinry frequency +pressure +nausea Reviewed past medical,surgical, social and family history. Reviewed medications and allergies.     Objective:   Physical Exam   BP 133/81 (BP Location: Left Arm, Patient Position: Sitting, Cuff Size: Normal)   Pulse 74   Temp 97.9 F (36.6 C)   Ht 5' 2.5" (1.588 m)   Wt 127 lb (57.6 kg)   BMI 22.86 kg/m Urine dipstick 2+leuks and trace blood. Skin warm and dry.  Lungs: clear to ausculation bilaterally. Cardiovascular: regular rate and rhythm. No CVAT Fall risk is moderate  Upstream - 06/21/20 0936      Pregnancy Intention Screening   Does the patient want to become pregnant in the next year? N/A    Does the patient's partner want to become pregnant in the next year? N/A    Would the patient like to discuss contraceptive options today? N/A      Contraception Wrap Up   Current Method Female Sterilization   hyst   End Method Female Sterilization   hyst   Contraception Counseling Provided No             Assessment:     1. Urinary urgency - POCT Urinalysis Dipstick  2. Urinary tract infection with hematuria, site unspecified Will rx Cipro and send UA C&S  Meds ordered this encounter  Medications  . ciprofloxacin (CIPRO) 500 MG tablet    Sig: Take 1 tablet (500 mg total) by mouth 2 (two) times daily.    Dispense:  10 tablet    Refill:  0    Order Specific Question:   Supervising Provider    Answer:   Tania Ade H [[id number]]   - Urinalysis, Routine w reflex microscopic - Urine Culture    Plan:     Push fluids  Can use AZO if needed, has pyridium at home she  thinks Follow up prn

## 2020-06-21 NOTE — Patient Instructions (Signed)
Strengthening Exercises: *Complete exercises using _1___ pound weight, __15__times each, __2__times per day*  1) WRIST EXTENSION CURLS - TABLE  Hold a small free weight, rest your forearm on a table and bend your wrist up and down with your palm face down as shown.      2) WRIST FLEXION CURLS - TABLE  Hold a small free weight, rest your forearm on a table and bend your wrist up and down with your palm face up as shown.     3) FREE WEIGHT RADIAL/ULNAR DEVIATION - TABLE  Hold a small free weight, rest your forearm on a table and bend your wrist up and down with your palm facing towards the side as shown.     4) Pronation  Forearm supported on table with wrist in neutral position. Using a weight, roll wrist so that palm faces downward. Hold for 2 seconds and return to starting position.     5) Supination  Forearm supported on table with wrist in neutral position. Using a weight, roll wrist so that palm is now facing upward. Hold for 2 seconds and return to starting position.

## 2020-06-21 NOTE — Therapy (Signed)
[facility] [address] [place], [place], [address] Phone: [phone]   Fax:  [phone]  Occupational Therapy Treatment  Patient Details  Name: Kylie Duffy MRN: [id number] Date of Birth: [birth year] Referring Provider (OT): Dr. Arther Abbott   Encounter Date: 06/21/2020   OT End of Session - 06/21/20 0809    Visit Number 13    Number of Visits 20    Date for OT Re-Evaluation 06/29/20    Authorization Type UMR, $20 copa    Authorization Time Period no visit limit    OT Start Time 0739   pt arrived late   OT Stop Time 0811    OT Time Calculation (min) 32 min    Activity Tolerance Patient tolerated treatment well    Behavior During Therapy [facility] for tasks assessed/performed           Past Medical History:  Diagnosis Date  . Burning with urination 11/01/2014  . Hematuria 07/28/2013  . History of UTI   . Hyperlipidemia   . Osteopenia   . PONV (postoperative nausea and vomiting)   . Vaginal dryness 12/03/2012    Past Surgical History:  Procedure Laterality Date  . ABDOMINAL HYSTERECTOMY    . BREAST BIOPSY Left   . CLOSED REDUCTION WRIST FRACTURE Right 02/24/2020   Procedure: CLOSED REDUCTION WRIST with pin placement;  Surgeon: Carole Civil, MD;  Location: [facility];  Service: Orthopedics;  Laterality: Right;  . COLONOSCOPY N/A 08/25/2016   Procedure: COLONOSCOPY;  Surgeon: Danie Binder, MD;  Location[facility];  Service: Endoscopy;  Laterality: N/A;  . DIAGNOSTIC LAPAROSCOPY    . HARDWARE REMOVAL Right 05/29/2020   Procedure: PIN REMOVAL RIGHT WRIST;  Surgeon: Carole Civil, MD;  Location: [facility];  Service: Orthopedics;  Laterality: Right;  . KNEE ARTHROSCOPY WITH MEDIAL MENISECTOMY  01/26/2012   Procedure: KNEE ARTHROSCOPY WITH MEDIAL MENISECTOMY;  Surgeon: Carole Civil, MD;  Location: [facility];  Service: Orthopedics;  Laterality: Left;  . POLYPECTOMY  08/25/2016   Procedure: POLYPECTOMY;  Surgeon: Danie Binder, MD;  [facility];  Service: Endoscopy;;  . TONSILLECTOMY      There were no vitals filed for this visit.   Subjective Assessment - 06/21/20 0739    Subjective  S: It was sore after the last session.    Currently in Pain? No/denies              [facility] OT Assessment - 06/21/20 0739      Assessment   Medical Diagnosis s/p right radius fx with percutaneous pinning      Precautions   Precautions None                    OT Treatments/Exercises (OP) - 06/21/20 0746      Exercises   Exercises Wrist;Hand;Theraputty      Wrist Exercises   Wrist Flexion PROM;5 reps;Strengthening;10 reps    Bar Weights/Barbell (Wrist Flexion) 2 lbs    Wrist Extension PROM;5 reps;Strengthening;10 reps    Bar Weights/Barbell (Wrist Extension) 2 lbs    Wrist Radial Deviation PROM;5 reps;Strengthening;10 reps    Bar Weights/Barbell (Radial Deviation) 2 lbs    Wrist Ulnar Deviation PROM;5 reps;Strengthening;10 reps    Bar Weights/Barbell (Ulnar Deviation) 2 lbs    Other wrist exercises forearm supination/pronation, 2#, 15X; prayer stretch for wrist extension: 2x20", wrist flexion stretch with arm extended 2x20"    Other wrist exercises Pt working  on wrist extension and wrist flexion pulling squigz from tabletop, 10X each      Additional Wrist Exercises   Sponges Pt using red clothespin to stack high resistance sponges, completing 4 towers of 5 sponges, then using lateral pinch to remove and replace into bucket.    Hand Gripper with Large Beads all beads gripper at 35#, vertical    Hand Gripper with Medium Beads all beads gripper at 25#, vertical    Hand Gripper with Small Beads all beads gripper at 25#, vertical                  OT Education - 06/21/20 0749    Education Details wrist strengthening; upgraded putty to red    Person(s) Educated Patient    Methods Explanation;Demonstration;Handout    Comprehension Verbalized understanding;Returned demonstration             OT Short Term Goals - 05/18/20 1147      OT SHORT TERM GOAL #1   Title Patient will be educated on and independent with HEP to improve mobility required for ADL completion using RUE.    Time 4    Period Weeks    Status On-going    Target Date 05/24/20      OT SHORT TERM GOAL #2   Title Pt will decrease pain in RUE to 3/10 or less to improve ability to complete cooking tasks with minimal compensatory strategies.    Time 4    Period Weeks    Status Achieved      OT SHORT TERM GOAL #3   Title Pt will increase right wrist A/ROM to [facility] to improve ability to perform dressing and bathing tasks.    Time 4    Period Weeks    Status On-going      OT SHORT TERM GOAL #4   Title Pt will increase right wrist strength to 4+/5 or greater to improve ability to perform lifting tasks at home and work.    Time 4    Period Weeks    Status On-going      OT SHORT TERM GOAL #5   Title Pt will increase right grip strength by 20# and pinch strength by 5# to improve ability to grasp and maintain hold on items/tools during functional use.    Time 4    Period Weeks    Status On-going                    Plan - 06/21/20 0750    Clinical Impression Statement A: Pt reports soreness after previous session. Continued with passive stretching, wrist strengthening, and grip strengthening. Pt with improvement in activity tolerance when completing grip strengthening before wrist strengthening today. Added squigz task for wrist strengthening, pinch task with resistive clothespin. Verbal cuing for form and technique.    Body Structure / Function / Physical Skills ADL;Endurance;UE functional use;Fascial restriction;Pain;ROM;IADL;Strength    Plan P: Continue with strengthening, trial red theraband wrist strengthening, follow up on HEP    OT Home Exercise Plan eval: wrist A/ROM, yellow theraputty for grip and pinch 4/6: Wrist stretches; 5/26: wrist strengthening    Consulted and Agree with Plan  of Care Patient           Patient will benefit from skilled therapeutic intervention in order to improve the following deficits and impairments:   Body Structure / Function / Physical Skills: ADL,Endurance,UE functional use,Fascial restriction,Pain,ROM,IADL,Strength       Visit Diagnosis: Other symptoms and  signs involving the musculoskeletal system  Stiffness of right wrist, not elsewhere classified  Pain in right wrist  Stiffness of right hand, not elsewhere classified    Problem List Patient Active Problem List   Diagnosis Date Noted  . Closed fracture distal radius and ulna, right, sequela   . Encounter for screening fecal occult blood testing 04/25/2020  . Encounter for well woman exam with routine gynecological exam 04/25/2020  . Fracture, [place], right, closed 02/29/2020  . Closed fracture of right wrist   . Thrombocytosis 07/19/2019  . Well woman exam with routine gynecological exam 02/11/2018  . Screening for colorectal cancer 02/11/2018  . Pituitary abnormality ([place]) 12/26/2016  . Hyperlipidemia 06/30/2016  . Osteopenia 01/04/2014  . Migraines 01/04/2014  . Medial meniscus, posterior horn derangement 01/29/2012  . Chondromalacia of patella, left 01/29/2012   Guadelupe Sabin, OTR/L  [phone] 06/21/2020, 8:12 AM  [facility] [place], [place], [address] Phone: [phone]   Fax:  [phone]  Name: Kylie Duffy MRN: [id number] Date of Birth: 1955-03-09

## 2020-06-22 LAB — URINALYSIS, ROUTINE W REFLEX MICROSCOPIC
Bilirubin, UA: NEGATIVE
Glucose, UA: NEGATIVE
Ketones, UA: NEGATIVE
Nitrite, UA: NEGATIVE
Protein,UA: NEGATIVE
RBC, UA: NEGATIVE
Specific Gravity, UA: 1.011 (ref 1.005–1.030)
Urobilinogen, Ur: 0.2 mg/dL (ref 0.2–1.0)
pH, UA: 6.5 (ref 5.0–7.5)

## 2020-06-22 LAB — MICROSCOPIC EXAMINATION
Bacteria, UA: NONE SEEN
Casts: NONE SEEN /lpf
Epithelial Cells (non renal): NONE SEEN /hpf (ref 0–10)
RBC, Urine: NONE SEEN /hpf (ref 0–2)

## 2020-06-26 ENCOUNTER — Encounter (HOSPITAL_COMMUNITY): Payer: 59 | Admitting: Occupational Therapy

## 2020-06-27 ENCOUNTER — Other Ambulatory Visit: Payer: Self-pay

## 2020-06-27 ENCOUNTER — Encounter: Payer: Self-pay | Admitting: *Deleted

## 2020-06-27 ENCOUNTER — Encounter (HOSPITAL_COMMUNITY): Payer: Self-pay | Admitting: Occupational Therapy

## 2020-06-27 ENCOUNTER — Ambulatory Visit (HOSPITAL_COMMUNITY): Payer: 59 | Attending: Orthopedic Surgery | Admitting: Occupational Therapy

## 2020-06-27 DIAGNOSIS — M25641 Stiffness of right hand, not elsewhere classified: Secondary | ICD-10-CM | POA: Insufficient documentation

## 2020-06-27 DIAGNOSIS — M25631 Stiffness of right wrist, not elsewhere classified: Secondary | ICD-10-CM | POA: Diagnosis not present

## 2020-06-27 DIAGNOSIS — R29898 Other symptoms and signs involving the musculoskeletal system: Secondary | ICD-10-CM | POA: Insufficient documentation

## 2020-06-27 DIAGNOSIS — M25531 Pain in right wrist: Secondary | ICD-10-CM | POA: Diagnosis not present

## 2020-06-27 NOTE — Therapy (Signed)
[facility] [place], [place], [address] Phone: [phone]   Fax:  [phone]  Occupational Therapy Treatment  Patient Details  Name: Kylie Duffy MRN: [id number] Date of Birth: 04/17/1955 Referring Provider (OT): Dr. Arther Abbott   Encounter Date: 06/27/2020   OT End of Session - 06/27/20 1424    Visit Number 14    Number of Visits 20    Date for OT Re-Evaluation 06/29/20    Authorization Type UMR, $20 copa    Authorization Time Period no visit limit    OT Start Time 1346    OT Stop Time 1425    OT Time Calculation (min) 39 min    Activity Tolerance Patient tolerated treatment well    Behavior During Therapy [facility] for tasks assessed/performed           Past Medical History:  Diagnosis Date  . Burning with urination 11/01/2014  . Hematuria 07/28/2013  . History of UTI   . Hyperlipidemia   . Osteopenia   . PONV (postoperative nausea and vomiting)   . Vaginal dryness 12/03/2012    Past Surgical History:  Procedure Laterality Date  . ABDOMINAL HYSTERECTOMY    . BREAST BIOPSY Left   . CLOSED REDUCTION WRIST FRACTURE Right 02/24/2020   Procedure: CLOSED REDUCTION WRIST with pin placement;  Surgeon: Carole Civil, MD;  Location: [facility];  Service: Orthopedics;  Laterality: Right;  . COLONOSCOPY N/A 08/25/2016   Procedure: COLONOSCOPY;  Surgeon: Danie Binder, MD;  [facility];  Service: Endoscopy;  Laterality: N/A;  . DIAGNOSTIC LAPAROSCOPY    . HARDWARE REMOVAL Right 05/29/2020   Procedure: PIN REMOVAL RIGHT WRIST;  Surgeon: Carole Civil, MD;  Location: [facility];  Service: Orthopedics;  Laterality: Right;  . KNEE ARTHROSCOPY WITH MEDIAL MENISECTOMY  01/26/2012   Procedure: KNEE ARTHROSCOPY WITH MEDIAL MENISECTOMY;  Surgeon: Carole Civil, MD;  Location: [facility];  Service: Orthopedics;  Laterality: Left;  . POLYPECTOMY  08/25/2016   Procedure: POLYPECTOMY;  Surgeon: Danie Binder, MD;   [facility];  Service: Endoscopy;;  . TONSILLECTOMY      There were no vitals filed for this visit.   Subjective Assessment - 06/27/20 1347    Subjective  S: It's a little stiff sometimes.    Currently in Pain? No/denies              [facility] OT Assessment - 06/27/20 1347      Assessment   Medical Diagnosis s/p right radius fx with percutaneous pinning      Precautions   Precautions None                    OT Treatments/Exercises (OP) - 06/27/20 1350      Exercises   Exercises Wrist;Hand;Theraputty      Wrist Exercises   Wrist Flexion PROM;5 reps;Strengthening;Theraband;10 reps    Theraband Level (Wrist Flexion) Level 2 (Red)    Bar Weights/Barbell (Wrist Flexion) 2 lbs    Wrist Extension PROM;5 reps;Strengthening;Theraband;10 reps    Theraband Level (Wrist Extension) Level 2 (Red)    Bar Weights/Barbell (Wrist Extension) 2 lbs    Wrist Radial Deviation PROM;5 reps;Strengthening;Theraband;10 reps    Theraband Level (Radial Deviation) Level 2 (Red)    Bar Weights/Barbell (Radial Deviation) 2 lbs    Wrist Ulnar Deviation PROM;5 reps;Strengthening;10 reps    Bar Weights/Barbell (Ulnar Deviation) 2 lbs    Other wrist exercises forearm  supination/pronation, 2#, 10X; red theraband 10X    Other wrist exercises pt working on stacking connect 4 chips, keeping forearm on the table and utilizing wrist extension.      Additional Wrist Exercises   Hand Gripper with Large Beads all beads gripper at 35#, vertical    Hand Gripper with Medium Beads all beads gripper at 25#, vertical    Hand Gripper with Small Beads all beads gripper at 25#, vertical      Fine Motor Coordination (Hand/Wrist)   Fine Motor Coordination Manipulating coins    Manipulating coins Pt holding coins in palm working on in-hand manipulation and wrist ROM to bring to fingertips and place into slotted container.                    OT Short Term Goals - 05/18/20 1147      OT  SHORT TERM GOAL #1   Title Patient will be educated on and independent with HEP to improve mobility required for ADL completion using RUE.    Time 4    Period Weeks    Status On-going    Target Date 05/24/20      OT SHORT TERM GOAL #2   Title Pt will decrease pain in RUE to 3/10 or less to improve ability to complete cooking tasks with minimal compensatory strategies.    Time 4    Period Weeks    Status Achieved      OT SHORT TERM GOAL #3   Title Pt will increase right wrist A/ROM to [facility] to improve ability to perform dressing and bathing tasks.    Time 4    Period Weeks    Status On-going      OT SHORT TERM GOAL #4   Title Pt will increase right wrist strength to 4+/5 or greater to improve ability to perform lifting tasks at home and work.    Time 4    Period Weeks    Status On-going      OT SHORT TERM GOAL #5   Title Pt will increase right grip strength by 20# and pinch strength by 5# to improve ability to grasp and maintain hold on items/tools during functional use.    Time 4    Period Weeks    Status On-going                    Plan - 06/27/20 1414    Clinical Impression Statement A: Pt reports stiffness in the mornings and after extended use. Continued with wrist passive stretching, strengthening with 2# weights, and added red theraband today. Continued with grip strengthening and pinch tasks while incorporating wrist ROM. Verbal cuing for form and technique, no pain reported during exercises.    Body Structure / Function / Physical Skills ADL;Endurance;UE functional use;Fascial restriction;Pain;ROM;IADL;Strength    Plan P: Reassessment, update HEP    OT Home Exercise Plan eval: wrist A/ROM, yellow theraputty for grip and pinch 4/6: Wrist stretches; 5/26: wrist strengthening    Consulted and Agree with Plan of Care Patient           Patient will benefit from skilled therapeutic intervention in order to improve the following deficits and impairments:   Body  Structure / Function / Physical Skills: ADL,Endurance,UE functional use,Fascial restriction,Pain,ROM,IADL,Strength       Visit Diagnosis: Other symptoms and signs involving the musculoskeletal system  Stiffness of right wrist, not elsewhere classified  Pain in right wrist  Stiffness of right hand,  not elsewhere classified    Problem List Patient Active Problem List   Diagnosis Date Noted  . Urinary tract infection with hematuria 06/21/2020  . Urinary urgency 06/21/2020  . Closed fracture distal radius and ulna, right, sequela   . Encounter for screening fecal occult blood testing 04/25/2020  . Encounter for well woman exam with routine gynecological exam 04/25/2020  . Fracture, [place], right, closed 02/29/2020  . Closed fracture of right wrist   . Thrombocytosis 07/19/2019  . Well woman exam with routine gynecological exam 02/11/2018  . Screening for colorectal cancer 02/11/2018  . Pituitary abnormality ([place]) 12/26/2016  . Hyperlipidemia 06/30/2016  . Osteopenia 01/04/2014  . Migraines 01/04/2014  . Medial meniscus, posterior horn derangement 01/29/2012  . Chondromalacia of patella, left 01/29/2012   Guadelupe Sabin, OTR/L  [phone] 06/27/2020, 2:26 PM  [place], [place], [address] Phone: [phone]   Fax:  [phone]  Name: Kylie Duffy MRN: [id number] Date of Birth: 19-Mar-1955

## 2020-06-28 ENCOUNTER — Encounter (HOSPITAL_COMMUNITY): Payer: Self-pay | Admitting: Occupational Therapy

## 2020-06-28 ENCOUNTER — Ambulatory Visit (HOSPITAL_COMMUNITY): Payer: 59 | Admitting: Occupational Therapy

## 2020-06-28 DIAGNOSIS — R29898 Other symptoms and signs involving the musculoskeletal system: Secondary | ICD-10-CM

## 2020-06-28 DIAGNOSIS — M25631 Stiffness of right wrist, not elsewhere classified: Secondary | ICD-10-CM

## 2020-06-28 DIAGNOSIS — M25641 Stiffness of right hand, not elsewhere classified: Secondary | ICD-10-CM | POA: Diagnosis not present

## 2020-06-28 DIAGNOSIS — M25531 Pain in right wrist: Secondary | ICD-10-CM

## 2020-06-28 LAB — URINE CULTURE

## 2020-06-28 NOTE — Patient Instructions (Signed)
  1) Wrist extension: -Fix the band firmly under your foot and hold the other end in your affected hand/arm.  -Place your elbow over your knee and let your affected wrist hang towards the floor, palm down.  -Pull your wrist up towards the ceiling against the resistance of the band.     2) Forearm supination -Fix band firmly under foot, hold other end in affected hand/arm.  -Place elbow on knee, with affected hand/arm palm up.  -Turn your forearm to a palm up position against the resistance of the band   3) Pronation -Fix band firmly under foot, hold other end in affected hand/arm.  -Place elbow on knee, with affected hand/arm palm down.  -Turn your palm down towards the floor against the resistance of the band     4) Wrist Flexion -Fix the band firmly under your foot and hold the other end in your affected hand/arm.  -Place your elbow over your knee and let your affected wrist point towards the ceiling, palm up.  -Pull your palm up towards the ceiling against the resistance of the band.

## 2020-06-28 NOTE — Therapy (Signed)
[place], [place], [address] Phone: [phone]   Fax:  [phone]  Occupational Therapy Reassessment, Treatment, Discharge Summary  Patient Details  Name: Kylie Duffy MRN: [id number] Date of Birth: 1955/05/14 Referring Provider (OT): Dr. Arther Abbott      [facility] OT Assessment - 06/28/20 1656      Assessment   Medical Diagnosis s/p right radius fx with percutaneous pinning      Precautions   Precautions None      Observation/Other Assessments   Quick DASH  18.18   31.82 previous     AROM   AROM Assessment Site Forearm;Wrist    Right/Left Forearm Right    Right Forearm Pronation 90 Degrees   same as previous   Right Forearm Supination 70 Degrees   60 previous   Right/Left Wrist Right    Right Wrist Extension 60 Degrees   30 previous   Right Wrist Flexion 58 Degrees   52 previous   Right Wrist Radial Deviation 26 Degrees   24 previous   Right Wrist Ulnar Deviation 30 Degrees   same as previous     PROM   PROM Assessment Site Forearm;Wrist    Right/Left Forearm Right    Right/Left Wrist Right      Strength   Right Forearm Pronation 5/5   4+/5 prevoius   Right Forearm Supination 4/5   same as previous   Right Wrist Flexion 5/5   4+/5 previous   Right Wrist Extension 5/5   4+/5 previous   Right Wrist Radial Deviation 4+/5   4/5 previous   Right Wrist Ulnar Deviation 4+/5   4/5 previous   Right Hand Gross Grasp Functional    Right Hand Grip (lbs) 30   20 previous   Right Hand Lateral Pinch 12 lbs   same as previous   Right Hand 3 Point Pinch 10 lbs   9            Encounter Date: 06/28/2020   OT End of Session - 06/28/20 1719    Visit Number 15    Number of Visits 20    Date for OT Re-Evaluation 06/29/20    Authorization Type [employer], $20 copa    Authorization Time Period no visit limit    OT Start Time 1655   pt arrived late   OT Stop Time 1725    OT Time Calculation (min) 30 min     Activity Tolerance Patient tolerated treatment well    Behavior During Therapy WFL for tasks assessed/performed           Past Medical History:  Diagnosis Date  . Burning with urination 11/01/2014  . Hematuria 07/28/2013  . History of UTI   . Hyperlipidemia   . Osteopenia   . PONV (postoperative nausea and vomiting)   . Vaginal dryness 12/03/2012    Past Surgical History:  Procedure Laterality Date  . ABDOMINAL HYSTERECTOMY    . BREAST BIOPSY Left   . CLOSED REDUCTION WRIST FRACTURE Right 02/24/2020   Procedure: CLOSED REDUCTION WRIST with pin placement;  Surgeon: Carole Civil, MD;  Location: [facility];  Service: Orthopedics;  Laterality: Right;  . COLONOSCOPY N/A 08/25/2016   Procedure: COLONOSCOPY;  Surgeon: Danie Binder, MD;  [facility]: [facility];  Service: Endoscopy;  Laterality: N/A;  . DIAGNOSTIC LAPAROSCOPY    . HARDWARE REMOVAL Right 05/29/2020   Procedure: PIN REMOVAL RIGHT WRIST;  Surgeon: Carole Civil, MD;  Location: [facility];  Service: Orthopedics;  Laterality: Right;  . KNEE ARTHROSCOPY WITH MEDIAL MENISECTOMY  01/26/2012   Procedure: KNEE ARTHROSCOPY WITH MEDIAL MENISECTOMY;  Surgeon: Carole Civil, MD;  Location: [facility];  Service: Orthopedics;  Laterality: Left;  . POLYPECTOMY  08/25/2016   Procedure: POLYPECTOMY;  Surgeon: Danie Binder, MD;  [facility]: [facility];  Service: Endoscopy;;  . TONSILLECTOMY      There were no vitals filed for this visit.   Subjective Assessment - 06/28/20 1656    Subjective  S: I was a little sore yesterday.    Currently in Pain? No/denies                [name] - 06/28/20 1705    Open a tight or new jar Moderate difficulty    Do heavy household chores (wash walls, wash floors) Moderate difficulty    Carry a shopping bag or briefcase No difficulty    Wash your back Mild difficulty    Use a knife to cut food No difficulty    Recreational activities in which you take some force or impact through your  arm, shoulder, or hand (golf, hammering, tennis) Moderate difficulty    During the past week, to what extent has your arm, shoulder or hand problem interfered with your normal social activities with family, friends, neighbors, or groups? Not at all    During the past week, to what extent has your arm, shoulder or hand problem limited your work or other regular daily activities Not at all    Arm, shoulder, or hand pain. Mild    Tingling (pins and needles) in your arm, shoulder, or hand None    Difficulty Sleeping No difficulty    DASH Score 18.18 %                OT Treatments/Exercises (OP) - 06/28/20 1709      Exercises   Exercises Wrist;Hand;Theraputty      Wrist Exercises   Wrist Flexion Theraband;10 reps    Theraband Level (Wrist Flexion) Level 2 (Red)    Wrist Extension Theraband;10 reps    Theraband Level (Wrist Extension) Level 2 (Red)    Other wrist exercises red theraband: forearm supination/pronation, 10X                    OT Short Term Goals - 06/28/20 1707      OT SHORT TERM GOAL #1   Title Patient will be educated on and independent with HEP to improve mobility required for ADL completion using RUE.    Time 4    Period Weeks    Status Achieved    Target Date 05/24/20      OT SHORT TERM GOAL #2   Title Pt will decrease pain in RUE to 3/10 or less to improve ability to complete cooking tasks with minimal compensatory strategies.    Time 4    Period Weeks    Status Achieved      OT SHORT TERM GOAL #3   Title Pt will increase right wrist A/ROM to [facility] to improve ability to perform dressing and bathing tasks.    Time 4    Period Weeks    Status Achieved      OT SHORT TERM GOAL #4   Title Pt will increase right wrist strength to 4+/5 or greater to improve ability to perform lifting tasks at home and work.    Time 4    Period  Weeks    Status Partially Met      OT SHORT TERM GOAL #5   Title Pt will increase right grip strength by 20# and pinch  strength by 5# to improve ability to grasp and maintain hold on items/tools during functional use.    Time 4    Period Weeks    Status Achieved                    Plan - 06/28/20 1716    Clinical Impression Statement A: Reassessment completed this session, pt has made great progress with her ROM and strength of the right wrist, has met 4/5 goals with remaining goal partially met. Pt has A/ROM and strength WFL, is able to complete most ADLs and work tasks without difficulty. Pt does continue to have fatigue with extended use, has difficulty with using a knife to cut dense meat. Pt completing red theraband strengthening exercises and HEP has been updated for theraband strengthening. Pt is agreeable to discharge with HEP.    Body Structure / Function / Physical Skills ADL;Endurance;UE functional use;Fascial restriction;Pain;ROM;IADL;Strength    Plan P: Discharge pt    OT Home Exercise Plan eval: wrist A/ROM, yellow theraputty for grip and pinch 4/6: Wrist stretches; 5/26: wrist strengthening; 6/2: red theraband strengthening    Consulted and Agree with Plan of Care Patient           Patient will benefit from skilled therapeutic intervention in order to improve the following deficits and impairments:   Body Structure / Function / Physical Skills: ADL,Endurance,UE functional use,Fascial restriction,Pain,ROM,IADL,Strength       Visit Diagnosis: Other symptoms and signs involving the musculoskeletal system  Stiffness of right wrist, not elsewhere classified  Pain in right wrist  Stiffness of right hand, not elsewhere classified    Problem List Patient Active Problem List   Diagnosis Date Noted  . Urinary tract infection with hematuria 06/21/2020  . Urinary urgency 06/21/2020  . Closed fracture distal radius and ulna, right, sequela   . Encounter for screening fecal occult blood testing 04/25/2020  . Encounter for well woman exam with routine gynecological exam 04/25/2020   . Fracture, [place], right, closed 02/29/2020  . Closed fracture of right wrist   . Thrombocytosis 07/19/2019  . Well woman exam with routine gynecological exam 02/11/2018  . Screening for colorectal cancer 02/11/2018  . Pituitary abnormality ([place]) 12/26/2016  . Hyperlipidemia 06/30/2016  . Osteopenia 01/04/2014  . Migraines 01/04/2014  . Medial meniscus, posterior horn derangement 01/29/2012  . Chondromalacia of patella, left 01/29/2012   Guadelupe Sabin, OTR/L  [id number] 06/28/2020, 5:25 PM  [place], [place], [address] Phone: [phone]   Fax:  [phone]  Name: LIRA STEPHEN MRN: [id number] Date of Birth: [birth year]   OCCUPATIONAL THERAPY DISCHARGE SUMMARY  Visits from Start of Care: 15  Current functional level related to goals / functional outcomes: See above. Pt has met 4/5 goals, remaining goal partially met. Pt is using RUE as dominant during ADLs and work tasks. Is ready to discharge with HEP.   Remaining deficits: Decreased activity tolerance, slightly decreased strength   Education / Equipment: HEP for wrist strengthening Plan: Patient agrees to discharge.  Patient goals were met. Patient is being discharged due to meeting the stated rehab goals.  ?????

## 2020-07-13 ENCOUNTER — Other Ambulatory Visit: Payer: 59

## 2020-07-13 ENCOUNTER — Other Ambulatory Visit: Payer: Self-pay

## 2020-07-13 ENCOUNTER — Encounter: Payer: Self-pay | Admitting: Family Medicine

## 2020-07-13 DIAGNOSIS — E559 Vitamin D deficiency, unspecified: Secondary | ICD-10-CM | POA: Diagnosis not present

## 2020-07-13 DIAGNOSIS — E785 Hyperlipidemia, unspecified: Secondary | ICD-10-CM

## 2020-07-13 DIAGNOSIS — M8589 Other specified disorders of bone density and structure, multiple sites: Secondary | ICD-10-CM | POA: Diagnosis not present

## 2020-07-13 DIAGNOSIS — Z13220 Encounter for screening for lipoid disorders: Secondary | ICD-10-CM | POA: Diagnosis not present

## 2020-07-13 DIAGNOSIS — Z136 Encounter for screening for cardiovascular disorders: Secondary | ICD-10-CM

## 2020-07-14 LAB — CBC WITH DIFFERENTIAL/PLATELET
Absolute Monocytes: 381 cells/uL (ref 200–950)
Basophils Absolute: 81 cells/uL (ref 0–200)
Basophils Relative: 1 %
Eosinophils Absolute: 122 cells/uL (ref 15–500)
Eosinophils Relative: 1.5 %
HCT: 41.8 % (ref 35.0–45.0)
Hemoglobin: 14.1 g/dL (ref 11.7–15.5)
Lymphs Abs: 2219 cells/uL (ref 850–3900)
MCH: 30.2 pg (ref 27.0–33.0)
MCHC: 33.7 g/dL (ref 32.0–36.0)
MCV: 89.5 fL (ref 80.0–100.0)
MPV: 10.2 fL (ref 7.5–12.5)
Monocytes Relative: 4.7 %
Neutro Abs: 5297 cells/uL (ref 1500–7800)
Neutrophils Relative %: 65.4 %
Platelets: 557 10*3/uL — ABNORMAL HIGH (ref 140–400)
RBC: 4.67 10*6/uL (ref 3.80–5.10)
RDW: 13.6 % (ref 11.0–15.0)
Total Lymphocyte: 27.4 %
WBC: 8.1 10*3/uL (ref 3.8–10.8)

## 2020-07-14 LAB — LIPID PANEL
Cholesterol: 220 mg/dL — ABNORMAL HIGH (ref <200)
HDL: 63 mg/dL (ref >=50)
LDL Cholesterol (Calc): 141 mg/dL (calc) — ABNORMAL HIGH
Non-HDL Cholesterol (Calc): 157 mg/dL (calc) — ABNORMAL HIGH (ref <130)
Total CHOL/HDL Ratio: 3.5 (calc) (ref <5.0)
Triglycerides: 66 mg/dL (ref <150)

## 2020-07-14 LAB — COMPLETE METABOLIC PANEL WITH GFR
AG Ratio: 1.8 (calc) (ref 1.0–2.5)
ALT: 15 U/L (ref 6–29)
AST: 18 U/L (ref 10–35)
Albumin: 4.4 g/dL (ref 3.6–5.1)
Alkaline phosphatase (APISO): 65 U/L (ref 37–153)
BUN: 20 mg/dL (ref 7–25)
CO2: 27 mmol/L (ref 20–32)
Calcium: 9.3 mg/dL (ref 8.6–10.4)
Chloride: 102 mmol/L (ref 98–110)
Creat: 0.81 mg/dL (ref 0.50–0.99)
GFR, Est African American: 89 mL/min/{1.73_m2} (ref >=60)
GFR, Est Non African American: 77 mL/min/{1.73_m2} (ref >=60)
Globulin: 2.4 g/dL (calc) (ref 1.9–3.7)
Glucose, Bld: 82 mg/dL (ref 65–99)
Potassium: 4.9 mmol/L (ref 3.5–5.3)
Sodium: 138 mmol/L (ref 135–146)
Total Bilirubin: 0.5 mg/dL (ref 0.2–1.2)
Total Protein: 6.8 g/dL (ref 6.1–8.1)

## 2020-07-14 LAB — VITAMIN D 25 HYDROXY (VIT D DEFICIENCY, FRACTURES): Vit D, 25-Hydroxy: 44 ng/mL (ref 30–100)

## 2020-07-20 ENCOUNTER — Encounter: Payer: Self-pay | Admitting: Family Medicine

## 2020-07-20 ENCOUNTER — Ambulatory Visit (INDEPENDENT_AMBULATORY_CARE_PROVIDER_SITE_OTHER): Payer: 59 | Admitting: Family Medicine

## 2020-07-20 ENCOUNTER — Other Ambulatory Visit: Payer: Self-pay

## 2020-07-20 VITALS — BP 110/64 | HR 96 | Temp 97.9°F | Resp 14 | Ht 62.5 in | Wt 125.0 lb

## 2020-07-20 DIAGNOSIS — M8589 Other specified disorders of bone density and structure, multiple sites: Secondary | ICD-10-CM

## 2020-07-20 DIAGNOSIS — Z0001 Encounter for general adult medical examination with abnormal findings: Secondary | ICD-10-CM | POA: Diagnosis not present

## 2020-07-20 DIAGNOSIS — Z0000 Encounter for general adult medical examination without abnormal findings: Secondary | ICD-10-CM

## 2020-07-20 DIAGNOSIS — E785 Hyperlipidemia, unspecified: Secondary | ICD-10-CM | POA: Diagnosis not present

## 2020-07-20 NOTE — Progress Notes (Signed)
Subjective:    Patient ID: Kylie Duffy, female    DOB: 10/11/1955, 65 y.o.   MRN: [id number]  HPI Patient is a 65 year old Caucasian female who presents today for physical exam.  She is due for the shingles vaccine.  She is also due for a booster on her COVID shot.  We discussed this.  Her mammogram is up-to-date.  Her colonoscopy is due next year.  She is due for a bone density test that she does have borderline osteoporosis.  The remainder of her preventative care is up-to-date.  Past Medical History:  Diagnosis Date   Burning with urination 11/01/2014   Hematuria 07/28/2013   History of UTI    Hyperlipidemia    Osteopenia    PONV (postoperative nausea and vomiting)    Vaginal dryness 12/03/2012   Past Surgical History:  Procedure Laterality Date   ABDOMINAL HYSTERECTOMY     BREAST BIOPSY Left    CLOSED REDUCTION WRIST FRACTURE Right 02/24/2020   Procedure: CLOSED REDUCTION WRIST with pin placement;  Surgeon: Carole Civil, MD;  Location: [facility];  Service: Orthopedics;  Laterality: Right;   COLONOSCOPY N/A 08/25/2016   Procedure: COLONOSCOPY;  Surgeon: Danie Binder, MD;  [facility];  Service: Endoscopy;  Laterality: N/A;   DIAGNOSTIC LAPAROSCOPY     HARDWARE REMOVAL Right 05/29/2020   Procedure: PIN REMOVAL RIGHT WRIST;  Surgeon: Carole Civil, MD;  Location: [facility];  Service: Orthopedics;  Laterality: Right;   KNEE ARTHROSCOPY WITH MEDIAL MENISECTOMY  01/26/2012   Procedure: KNEE ARTHROSCOPY WITH MEDIAL MENISECTOMY;  Surgeon: Carole Civil, MD;  Location: [facility];  Service: Orthopedics;  Laterality: Left;   POLYPECTOMY  08/25/2016   Procedure: POLYPECTOMY;  Surgeon: Danie Binder, MD;  [facility];  Service: Endoscopy;;   TONSILLECTOMY     Current Outpatient Medications on File Prior to Visit  Medication Sig Dispense Refill   Calcium Carbonate-Vitamin D (CALCIUM 600 + D PO) Take 3 tablets by mouth daily. Takes 3 chewables  daily Calcifood     ciprofloxacin (CIPRO) 500 MG tablet Take 1 tablet (500 mg total) by mouth 2 (two) times daily. 10 tablet 0   ibuprofen (ADVIL,MOTRIN) 200 MG tablet Take 400 mg by mouth as needed for headache or moderate pain.     Multiple Vitamin (MULITIVITAMIN WITH MINERALS) TABS Take 1 tablet by mouth daily.     OVER THE COUNTER MEDICATION Take 1 each by mouth daily. [place]     OVER THE COUNTER MEDICATION Take 2 each by mouth daily. [place]ol[place]lex     SUMAtriptan (IMITREX) 100 MG tablet Take 1 tablet (100 mg total) by mouth every 2 (two) hours as needed for migraine or headache. May repeat in 2 hours if needed. 10 tablet 1   Vitamin D, Cholecalciferol, 25 MCG (1000 UT) TABS Take 1,000 Units by mouth 2 (two) times daily.      No current facility-administered medications on file prior to visit.   No Known Allergies Social History   Socioeconomic History   Marital status: Married    Spouse name: Not on file   Number of children: Not on file   Years of education: Not on file   Highest education level: Not on file  Occupational History   Not on file  Tobacco Use   Smoking status: Never   Smokeless tobacco: Never  Vaping Use   Vaping Use: Never used  Substance and Sexual Activity   Alcohol use: Not  Currently   Drug use: No   Sexual activity: Yes    Birth control/protection: Surgical    Comment: hyst  Other Topics Concern   Not on file  Social History Narrative   Not on file   Social Determinants of Health   Financial Resource Strain: Low Risk    Difficulty of Paying Living Expenses: Not very hard  Food Insecurity: No Food Insecurity   Worried About Running Out of Food in the Last Year: Never true   Ran Out of Food in the Last Year: Never true  Transportation Needs: No Transportation Needs   Lack of Transportation (Medical): No   Lack of Transportation (Non-Medical): No  Physical Activity: Insufficiently Active   Days of Exercise per Week: 6 days   Minutes of Exercise per  Session: 20 min  Stress: No Stress Concern Present   Feeling of Stress : Not at all  Social Connections: Moderately Integrated   Frequency of Communication with Friends and Family: More than three times a week   Frequency of Social Gatherings with Friends and Family: Once a week   Attends Religious Services: More than 4 times per year   Active Member of [employer] or Organizations: No   Attends Music therapist: Never   Marital Status: Married  Human resources officer Violence: Not At Risk   Fear of Current or Ex-Partner: No   Emotionally Abused: No   Physically Abused: No   Sexually Abused: No   Family History  Problem Relation Age of Onset   Hypertension Mother    Hyperlipidemia Mother    Thyroid disease Mother    Kidney disease Mother    Heart disease Father    Hyperlipidemia Father    Cancer Maternal Aunt    Cancer Maternal Uncle    Cancer Paternal Aunt    Cancer Paternal Uncle    Diabetes Maternal Grandmother    Cancer Paternal Grandmother        pancreatic   Hyperlipidemia Brother        borderline   Cancer Paternal Uncle    Alzheimer's disease Paternal Aunt    Colon cancer Neg Hx    Colon polyps Neg Hx    Breast cancer Neg Hx       Review of Systems  All other systems reviewed and are negative.     Objective:   Physical Exam Vitals reviewed.  Constitutional:      General: She is not in acute distress.    Appearance: Normal appearance. She is normal weight. She is not ill-appearing, toxic-appearing or diaphoretic.  HENT:     Head: Normocephalic and atraumatic.     Right Ear: Tympanic membrane, ear canal and external ear normal. There is no impacted cerumen.     Left Ear: Tympanic membrane, ear canal and external ear normal. There is no impacted cerumen.     Nose: Nose normal. No congestion or rhinorrhea.     Mouth/Throat:     Mouth: Mucous membranes are moist.     Pharynx: Oropharynx is clear. No oropharyngeal exudate or posterior oropharyngeal  erythema.  Eyes:     Extraocular Movements: Extraocular movements intact.     Conjunctiva/sclera: Conjunctivae normal.     Pupils: Pupils are equal, round, and reactive to light.  Neck:     Vascular: No carotid bruit.  Cardiovascular:     Rate and Rhythm: Normal rate and regular rhythm.     Pulses: Normal pulses.     Heart sounds: Normal heart  sounds. No murmur heard.   No friction rub. No gallop.  Pulmonary:     Effort: Pulmonary effort is normal. No respiratory distress.     Breath sounds: Normal breath sounds. No stridor. No wheezing, rhonchi or rales.  Abdominal:     General: Abdomen is flat. Bowel sounds are normal. There is no distension.     Palpations: Abdomen is soft. There is no mass.     Tenderness: There is no abdominal tenderness. There is no guarding or rebound.  Musculoskeletal:        General: No swelling or tenderness. Normal range of motion.     Cervical back: Normal range of motion. No rigidity or tenderness.     Right lower leg: No edema.     Left lower leg: No edema.  Lymphadenopathy:     Cervical: No cervical adenopathy.  Skin:    General: Skin is warm.     Coloration: Skin is not jaundiced or pale.     Findings: No bruising, erythema, lesion or rash.  Neurological:     General: No focal deficit present.     Mental Status: She is alert and oriented to person, place, and time. Mental status is at baseline.     Cranial Nerves: No cranial nerve deficit.     Sensory: No sensory deficit.     Motor: No weakness.     Coordination: Coordination normal.     Gait: Gait normal.     Deep Tendon Reflexes: Reflexes normal.  Psychiatric:        Mood and Affect: Mood normal.        Behavior: Behavior normal.        Thought Content: Thought content normal.        Judgment: Judgment normal.         Assessment & Plan:   Routine general medical examination at a health care facility  Hyperlipidemia, unspecified hyperlipidemia type  Osteopenia of multiple sites  - Plan: DG Bone Density Physical exam today is normal.  Patient is currently taking calcium and vitamin D.  Blood pressures well controlled.  I calculated her 10-year risk of cardiovascular disease to be around 3.7% with her current cholesterol.  Therefore she does not require statin.  Mammogram is up-to-date.  Colonoscopy is due next year.  I will schedule her for a bone density test.  Recommended a booster on her COVID shot.  Recommended the shingles vaccine.

## 2020-08-07 ENCOUNTER — Emergency Department (HOSPITAL_COMMUNITY)
Admission: EM | Admit: 2020-08-07 | Discharge: 2020-08-07 | Disposition: A | Discharge status: Home / Self Care | Payer: 59 | Attending: Emergency Medicine | Admitting: Emergency Medicine

## 2020-08-07 ENCOUNTER — Encounter (HOSPITAL_COMMUNITY): Payer: Self-pay | Admitting: Emergency Medicine

## 2020-08-07 ENCOUNTER — Emergency Department (HOSPITAL_COMMUNITY): Payer: 59

## 2020-08-07 DIAGNOSIS — Y9389 Activity, other specified: Secondary | ICD-10-CM | POA: Insufficient documentation

## 2020-08-07 DIAGNOSIS — S59911A Unspecified injury of right forearm, initial encounter: Secondary | ICD-10-CM | POA: Diagnosis present

## 2020-08-07 DIAGNOSIS — Y92410 Unspecified street and highway as the place of occurrence of the external cause: Secondary | ICD-10-CM | POA: Diagnosis not present

## 2020-08-07 DIAGNOSIS — M25539 Pain in unspecified wrist: Secondary | ICD-10-CM | POA: Diagnosis not present

## 2020-08-07 DIAGNOSIS — M25531 Pain in right wrist: Secondary | ICD-10-CM | POA: Diagnosis not present

## 2020-08-07 DIAGNOSIS — S50811A Abrasion of right forearm, initial encounter: Secondary | ICD-10-CM | POA: Diagnosis not present

## 2020-08-07 DIAGNOSIS — R52 Pain, unspecified: Secondary | ICD-10-CM | POA: Diagnosis not present

## 2020-08-07 DIAGNOSIS — S5011XA Contusion of right forearm, initial encounter: Secondary | ICD-10-CM | POA: Diagnosis not present

## 2020-08-07 MED ORDER — BACITRACIN-NEOMYCIN-POLYMYXIN 400-5-5000 EX OINT
TOPICAL_OINTMENT | Freq: Once | CUTANEOUS | Status: AC
  Administered 2020-08-07: 1 via TOPICAL
  Filled 2020-08-07: qty 1
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
## 2020-08-10 ENCOUNTER — Other Ambulatory Visit: Payer: Self-pay | Admitting: *Deleted

## 2020-08-10 DIAGNOSIS — M8589 Other specified disorders of bone density and structure, multiple sites: Secondary | ICD-10-CM

## 2020-08-13 ENCOUNTER — Other Ambulatory Visit: Payer: Self-pay

## 2020-08-13 ENCOUNTER — Encounter: Payer: Self-pay | Admitting: Orthopedic Surgery

## 2020-08-13 ENCOUNTER — Ambulatory Visit (INDEPENDENT_AMBULATORY_CARE_PROVIDER_SITE_OTHER): Payer: 59 | Admitting: Orthopedic Surgery

## 2020-08-13 DIAGNOSIS — S52531D Colles' fracture of right radius, subsequent encounter for closed fracture with routine healing: Secondary | ICD-10-CM
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
## 2020-09-07 ENCOUNTER — Ambulatory Visit: Payer: 59 | Admitting: Family Medicine

## 2020-09-07 ENCOUNTER — Encounter: Payer: Self-pay | Admitting: Family Medicine

## 2020-09-07 ENCOUNTER — Other Ambulatory Visit: Payer: Self-pay

## 2020-09-07 DIAGNOSIS — M542 Cervicalgia: Secondary | ICD-10-CM | POA: Diagnosis not present
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
## 2020-09-14 ENCOUNTER — Ambulatory Visit (HOSPITAL_COMMUNITY)
Admission: RE | Admit: 2020-09-14 | Discharge: 2020-09-14 | Disposition: A | Discharge status: Home / Self Care | Payer: 59 | Source: Ambulatory Visit | Attending: Family Medicine | Admitting: Family Medicine

## 2020-09-14 ENCOUNTER — Other Ambulatory Visit: Payer: Self-pay

## 2020-09-14 DIAGNOSIS — M8589 Other specified disorders of bone density and structure, multiple sites: Secondary | ICD-10-CM | POA: Insufficient documentation

## 2020-09-14 DIAGNOSIS — Z780 Asymptomatic menopausal state: Secondary | ICD-10-CM | POA: Diagnosis not present

## 2020-09-19 ENCOUNTER — Encounter: Payer: Self-pay | Admitting: Family Medicine

## 2020-10-04 ENCOUNTER — Other Ambulatory Visit (HOSPITAL_COMMUNITY): Payer: Self-pay

## 2020-10-04 ENCOUNTER — Encounter: Payer: Self-pay | Admitting: Family Medicine

## 2020-10-04 DIAGNOSIS — H25813 Combined forms of age-related cataract, bilateral: Secondary | ICD-10-CM | POA: Diagnosis not present

## 2020-10-04 DIAGNOSIS — H52203 Unspecified astigmatism, bilateral: Secondary | ICD-10-CM | POA: Diagnosis not present

## 2020-10-04 DIAGNOSIS — H5213 Myopia, bilateral: Secondary | ICD-10-CM | POA: Diagnosis not present

## 2020-10-04 DIAGNOSIS — H524 Presbyopia: Secondary | ICD-10-CM | POA: Diagnosis not present

## 2020-10-04 MED ORDER — SUMATRIPTAN SUCCINATE 100 MG PO TABS
100.0000 mg | ORAL_TABLET | ORAL | 1 refills | Status: DC | PRN
  Filled 2020-10-04: qty 9, 30d supply, fill #0
  Filled 2021-03-28: qty 9, 30d supply, fill #1

## 2021-03-28 ENCOUNTER — Other Ambulatory Visit (HOSPITAL_COMMUNITY): Payer: Self-pay

## 2021-04-03 ENCOUNTER — Other Ambulatory Visit: Payer: Self-pay | Admitting: Adult Health

## 2021-04-03 DIAGNOSIS — Z1231 Encounter for screening mammogram for malignant neoplasm of breast: Secondary | ICD-10-CM

## 2021-04-23 ENCOUNTER — Encounter (INDEPENDENT_AMBULATORY_CARE_PROVIDER_SITE_OTHER): Payer: 59 | Admitting: Ophthalmology

## 2021-04-23 ENCOUNTER — Encounter (INDEPENDENT_AMBULATORY_CARE_PROVIDER_SITE_OTHER): Payer: Self-pay

## 2021-04-23 ENCOUNTER — Encounter (INDEPENDENT_AMBULATORY_CARE_PROVIDER_SITE_OTHER): Payer: Self-pay | Admitting: Ophthalmology

## 2021-04-23 ENCOUNTER — Other Ambulatory Visit: Payer: Self-pay

## 2021-04-23 ENCOUNTER — Ambulatory Visit (INDEPENDENT_AMBULATORY_CARE_PROVIDER_SITE_OTHER): Payer: 59 | Admitting: Ophthalmology

## 2021-04-23 DIAGNOSIS — H35342 Macular cyst, hole, or pseudohole, left eye: Secondary | ICD-10-CM | POA: Diagnosis not present

## 2021-04-23 DIAGNOSIS — H35341 Macular cyst, hole, or pseudohole, right eye: Secondary | ICD-10-CM

## 2021-04-23 DIAGNOSIS — H43811 Vitreous degeneration, right eye: Secondary | ICD-10-CM | POA: Insufficient documentation

## 2021-04-23 DIAGNOSIS — H43813 Vitreous degeneration, bilateral: Secondary | ICD-10-CM

## 2021-04-23 DIAGNOSIS — H2511 Age-related nuclear cataract, right eye: Secondary | ICD-10-CM | POA: Insufficient documentation

## 2021-04-23 HISTORY — DX: Age-related nuclear cataract, right eye: H25.11

## 2021-04-23 NOTE — Assessment & Plan Note (Addendum)
Recent onset is full-thickness macular hole in the left eye dominant patient. ? ?With advanced cataract and significant medial opacity by clinical examination and by OCT, will need cataract surgery with intraocular lens implantation first followed 1 week thereafter by vitrectomy membrane peel to repair the macular hole. ? ?Patient to return to see Dr. Gershon Crane this week for planning of cataract surgery left eye first ? ?The nature of macular hole was discussed with the patient as well as possibility of second eye involvement at a later date.  The risks of no treatment were reviewed as well, as the possibility of surgical repair and the risks with that modality.  The requirement for postoperative face downward in a reading position was discussed.  The usual period of 3-5 days, positioning while awake, was disclosed.  The patient must not sleep on back.  The patient may sleep on either side after surgery  for approximately  2 weeks.  The alternative surgical repair with vitrectomy and using silicone oil was discussed.   Typical results with oil for this use to repair macular hole are not as good as gas utilization.   With oil use, the need for a second surgery reviewed.   If the patient's operative eye is [place], cataract surgery is very often recommended by referring to cataract surgeon, prior to Vitrectomy. This will allow a clear view of the macular hole  for the repair,  and best visual acuity results ultimately for the patient.  All the patient's questions were answered.  An informational note was given. ?

## 2021-04-23 NOTE — Assessment & Plan Note (Signed)
Will await cataract surgery in the right eye until the healing process for the left eye is completed ?

## 2021-04-23 NOTE — Assessment & Plan Note (Signed)
Spontaneous closure of macular hole in the past associated with PVD as noted by prefoveal operculum seen on OCT OD.  May limit ultimate acuity recovery post cataract surgery in the right eye nonetheless cataract surgery should be performed in the future so as to maximize visual potential ?

## 2021-04-23 NOTE — Progress Notes (Signed)
? ? ?04/23/2021 ? ?  ? ?CHIEF COMPLAINT ?Patient presents for  ?Chief Complaint  ?Patient presents with  ? Retina Evaluation  ? ? ? ? ?HISTORY OF PRESENT ILLNESS: ?Kylie Duffy is a 66 y.o. female who presents to the clinic today for:  ? ?HPI   ? ? Retina Evaluation   ? ?      ? Laterality: left eye  ? Associated Symptoms: Distortion.  Negative for Flashes, Floaters, Blind Spot, Pain, Redness, Photophobia, Glare, Trauma, Scalp Tenderness, Jaw Claudication, Shoulder/Hip pain, Fever, Weight Loss and Fatigue  ? ?  ?  ? ? Comments   ?NP- Mac hole OS. ?Pt states noticing immediate changes while ON epic. Pt then set up an appnt to be seen by Dr. Gershon Crane who also referred pt to Retina specialist. She noticed she had to strain and noticed a distortion. Pt stated that she would see a big black circle in the middle of her vision but it was very faint. Pt had her left eye dilated earlier today with Dr. Gershon Crane. ? ?  ?  ?Last edited by Silvestre Moment on 04/23/2021  3:35 PM.  ?  ? ? ?Referring physician: ?Susy Frizzle, MD ?[address] [place],  [place] [address] ? ?HISTORICAL INFORMATION:  ? ?Selected notes from the [facility] ?  ?   ? ?CURRENT MEDICATIONS: ?No current outpatient medications on file. (Ophthalmic Drugs)  ? ?No current facility-administered medications for this visit. (Ophthalmic Drugs)  ? ?Current Outpatient Medications (Other)  ?Medication Sig  ? Calcium Carbonate-Vitamin D (CALCIUM 600 + D PO) Take 3 tablets by mouth daily. Takes 3 chewables daily ?Calcifood  ? ibuprofen (ADVIL,MOTRIN) 200 MG tablet Take 400 mg by mouth as needed for headache or moderate pain.  ? Multiple Vitamin (MULITIVITAMIN WITH MINERALS) TABS Take 1 tablet by mouth daily.  ? Omega-3 Fatty Acids (FISH OIL) 1200 MG CAPS Take by mouth.  ? OVER THE COUNTER MEDICATION Take 1 each by mouth daily. Cyruta  ? OVER THE COUNTER MEDICATION Take 2 each by mouth daily. Cholaplex  ? SUMAtriptan (IMITREX) 100 MG tablet Take 1 tablet (100 mg  total) by mouth every 2 (two) hours as needed for migraine or headache. May repeat in 2 hours if needed.  ? Vitamin D, Cholecalciferol, 25 MCG (1000 UT) TABS Take 1,000 Units by mouth 2 (two) times daily.   ? ?No current facility-administered medications for this visit. (Other)  ? ? ? ? ?REVIEW OF SYSTEMS: ?ROS   ?Negative for: Constitutional, Gastrointestinal, Neurological, Skin, Genitourinary, Musculoskeletal, HENT, Endocrine, Cardiovascular, Eyes, Respiratory, Psychiatric, Allergic/Imm, Heme/Lymph ?Last edited by Silvestre Moment on 04/23/2021  3:35 PM.  ?  ? ? ? ?ALLERGIES ?No Known Allergies ? ?PAST MEDICAL HISTORY ?Past Medical History:  ?Diagnosis Date  ? Burning with urination 11/01/2014  ? Hematuria 07/28/2013  ? History of UTI   ? Hyperlipidemia   ? Osteopenia   ? PONV (postoperative nausea and vomiting)   ? Vaginal dryness 12/03/2012  ? ?Past Surgical History:  ?Procedure Laterality Date  ? ABDOMINAL HYSTERECTOMY    ? BREAST BIOPSY Left   ? CLOSED REDUCTION WRIST FRACTURE Right 02/24/2020  ? Procedure: CLOSED REDUCTION WRIST with pin placement;  Surgeon: Carole Civil, MD;  [facility]: [facility];  Service: Orthopedics;  Laterality: Right;  ? COLONOSCOPY N/A 08/25/2016  ? Procedure: COLONOSCOPY;  Surgeon: Danie Binder, MD;  [facility];  Service: Endoscopy;  Laterality: N/A;  ? DIAGNOSTIC LAPAROSCOPY    ?  HARDWARE REMOVAL Right 05/29/2020  ? Procedure: PIN REMOVAL RIGHT WRIST;  Surgeon: Carole Civil, MD;  Location: [facility];  Service: Orthopedics;  Laterality: Right;  ? KNEE ARTHROSCOPY WITH MEDIAL MENISECTOMY  01/26/2012  ? Procedure: KNEE ARTHROSCOPY WITH MEDIAL MENISECTOMY;  Surgeon: Carole Civil, MD;  Location: [facility];  Service: Orthopedics;  Laterality: Left;  ? POLYPECTOMY  08/25/2016  ? Procedure: POLYPECTOMY;  Surgeon: Danie Binder, MD;  [facility];  Service: Endoscopy;;  ? TONSILLECTOMY    ? ? ?FAMILY HISTORY ?Family History  ?Problem Relation Age of Onset  ? Hypertension  Mother   ? Hyperlipidemia Mother   ? Thyroid disease Mother   ? Kidney disease Mother   ? Heart disease Father   ? Hyperlipidemia Father   ? Cancer Maternal Aunt   ? Cancer Maternal Uncle   ? Cancer Paternal Aunt   ? Cancer Paternal Uncle   ? Diabetes Maternal Grandmother   ? Cancer Paternal Grandmother   ?     pancreatic  ? Hyperlipidemia Brother   ?     borderline  ? Cancer Paternal Uncle   ? Alzheimer's disease Paternal Aunt   ? Colon cancer Neg Hx   ? Colon polyps Neg Hx   ? Breast cancer Neg Hx   ? ? ?SOCIAL HISTORY ?Social History  ? ?Tobacco Use  ? Smoking status: Never  ? Smokeless tobacco: Never  ?Vaping Use  ? Vaping Use: Never used  ?Substance Use Topics  ? Alcohol use: Not Currently  ? Drug use: No  ? ?  ? ?  ? ?OPHTHALMIC EXAM: ? ?Base Eye Exam   ? ? Visual Acuity (ETDRS)   ? ?   Right Left  ? Dist [place] 20/40 20/80  ? Dist ph [place] NI NI  ? ?  ?  ? ? Tonometry (Tonopen, 3:44 PM)   ? ?   Right Left  ? Pressure 17 18  ? ?  ?  ? ? Pupils   ? ?   Pupils Dark Light Shape React APD  ? Right PERRL 4 3 Round Brisk None  ? Left PERRL 6 6 Irregular Minimal None  ? ?  ?  ? ? Visual Fields   ? ?   Left Right  ?  Full Full  ? ?  ?  ? ? Extraocular Movement   ? ?   Right Left  ?  Full Full  ? ?  ?  ? ? Neuro/Psych   ? ? Oriented x3: Yes  ? ?  ?  ? ?  ? ?Slit Lamp and Fundus Exam   ? ? Slit Lamp Exam   ? ?   Right Left  ? Lens 2+ Nuclear sclerosis 3+ Nuclear sclerosis  ? ?  ?  ? ? Fundus Exam   ? ?   Right Left  ? Posterior Vitreous Posterior vitreous detachment Posterior vitreous detachment  ? Disc Normal Normal  ? C/D Ratio 0.3 0.3  ? Macula Normal Macular hole, pos Watzke sign  ? Vessels Normal Normal  ? Periphery Normal Normal  ? ?  ?  ? ?  ? ? ?IMAGING AND PROCEDURES  ?Imaging and Procedures for 04/23/21 ? ?OCT, Retina - OU - Both Eyes   ? ?   ?Right Eye ?Quality was good. Scan locations included subfoveal. Central Foveal Thickness: 286.  ? ?Left Eye ?Quality was good. Scan locations included subfoveal. Central Foveal  Thickness: 409. Findings include cystoid  macular edema, macular hole.  ? ?Notes ?OD incidental PVD, with with apparent foveal operculum on the posterior hyaloid face concomitant with an attenuated foveal region OD Suggest a spontaneous macular hole developing in the right eye in the past with near spontaneous closure.  Break in the outer [place] on OCT is confirmatory of this finding but nonetheless with preserved good acuity except for the cataract ? ?OS with full-thickness macular hole perifoveal CME ? ?  ? ? ?  ?  ? ?  ?ASSESSMENT/PLAN: ? ?Macular hole of left eye ?Recent onset is full-thickness macular hole in the left eye dominant patient. ? ?With advanced cataract and significant medial opacity by clinical examination and by OCT, will need cataract surgery with intraocular lens implantation first followed 1 week thereafter by vitrectomy membrane peel to repair the macular hole. ? ?Patient to return to see Dr. Gershon Crane this week for planning of cataract surgery left eye first ? ?The nature of macular hole was discussed with the patient as well as possibility of second eye involvement at a later date.  The risks of no treatment were reviewed as well, as the possibility of surgical repair and the risks with that modality.  The requirement for postoperative face downward in a reading position was discussed.  The usual period of 3-5 days, positioning while awake, was disclosed.  The patient must not sleep on back.  The patient may sleep on either side after surgery  for approximately  2 weeks.  The alternative surgical repair with vitrectomy and using silicone oil was discussed.   Typical results with oil for this use to repair macular hole are not as good as gas utilization.   With oil use, the need for a second surgery reviewed.   If the patient's operative eye is [place], cataract surgery is very often recommended by referring to cataract surgeon, prior to Vitrectomy. This will allow a clear view of the macular hole   for the repair,  and best visual acuity results ultimately for the patient.  All the patient's questions were answered.  An informational note was given. ? ?Nuclear sclerotic cataract of right eye ?Will

## 2021-04-25 ENCOUNTER — Other Ambulatory Visit (HOSPITAL_COMMUNITY): Payer: Self-pay

## 2021-04-25 DIAGNOSIS — H25813 Combined forms of age-related cataract, bilateral: Secondary | ICD-10-CM | POA: Diagnosis not present

## 2021-04-25 MED ORDER — KETOROLAC TROMETHAMINE 0.5 % OP SOLN
1.0000 [drp] | OPHTHALMIC | 1 refills | Status: DC
  Filled 2021-04-25: qty 5, 16d supply, fill #0
  Filled 2021-05-27: qty 5, 16d supply, fill #1

## 2021-04-25 MED ORDER — OFLOXACIN 0.3 % OP SOLN
1.0000 [drp] | OPHTHALMIC | 1 refills | Status: DC
  Filled 2021-04-25: qty 5, 16d supply, fill #0
  Filled 2021-05-27: qty 5, 16d supply, fill #1

## 2021-04-25 MED ORDER — PREDNISOLONE ACETATE 1 % OP SUSP
1.0000 [drp] | OPHTHALMIC | 1 refills | Status: DC
  Filled 2021-04-25: qty 5, 16d supply, fill #0
  Filled 2021-05-27: qty 5, 16d supply, fill #1

## 2021-04-29 ENCOUNTER — Ambulatory Visit: Payer: 59 | Admitting: Adult Health

## 2021-05-01 DIAGNOSIS — H2512 Age-related nuclear cataract, left eye: Secondary | ICD-10-CM | POA: Diagnosis not present

## 2021-05-06 ENCOUNTER — Ambulatory Visit (INDEPENDENT_AMBULATORY_CARE_PROVIDER_SITE_OTHER): Payer: 59 | Admitting: Ophthalmology

## 2021-05-06 DIAGNOSIS — Z961 Presence of intraocular lens: Secondary | ICD-10-CM | POA: Diagnosis not present

## 2021-05-06 DIAGNOSIS — H35342 Macular cyst, hole, or pseudohole, left eye: Secondary | ICD-10-CM

## 2021-05-06 NOTE — Assessment & Plan Note (Signed)
OS, visual axis now clear to allow for macular hole and its subsequent closure via vitrectomy membrane peel ? ?Patient to continue on current topical medications ?

## 2021-05-06 NOTE — Assessment & Plan Note (Signed)
Preoperative review of expectations of postoperative positioning with the patient.  Reading or prayer position for 3 days postoperatively and sleeping on either side for the first 2 weeks ? ?Patient in the meantime to continue on topical medications left eye as prescribed initially by Dr. Rutherford Guys for the cataract surgery recovery ?

## 2021-05-06 NOTE — Progress Notes (Signed)
? ? ?05/06/2021 ? ?  ? ?CHIEF COMPLAINT ?Patient presents for  ?Chief Complaint  ?Patient presents with  ? Macular Hole  ? ? ? ? ?HISTORY OF PRESENT ILLNESS: ?Kylie Duffy is a 66 y.o. female who presents to the clinic today for:  ? ?HPI   ?Fu OS OCT S/P pre op OS SX 05/08/21. ?Pt had cataract surgery on the left eye on Wednesday April 5th performed by Dr. Gershon Crane. ?Pt stated no vision changes. ?Pt denies new onset symptoms. ? ? ? ?Last edited by Silvestre Moment on 05/06/2021 10:39 AM.  ?  ? ? ?Referring physician: ?Susy Frizzle, MD ?[address] [place],  [place] [address] ? ?HISTORICAL INFORMATION:  ? ?Selected notes from the [facility] ?  ?   ? ?CURRENT MEDICATIONS: ?Current Outpatient Medications (Ophthalmic Drugs)  ?Medication Sig  ? ketorolac (ACULAR) 0.5 % ophthalmic solution One drop in operative eye(s) 3 times a day starting 2 days before surgery and continuing 3 weeks after surgery.  ? ofloxacin (OCUFLOX) 0.3 % ophthalmic solution Instill 1 drop 3 times a day in operative eye(s) starting 2 days prior to surgery and after surgery for 3 weeks.  ? prednisoLONE acetate (PRED FORTE) 1 % ophthalmic suspension One drop in operative eye(s) 3 times a day starting 2 days before surgery and continuing after surgery for 3 weeks  ? ?No current facility-administered medications for this visit. (Ophthalmic Drugs)  ? ?Current Outpatient Medications (Other)  ?Medication Sig  ? Calcium Carbonate-Vitamin D (CALCIUM 600 + D PO) Take 3 tablets by mouth daily. Takes 3 chewables daily ?Calcifood  ? ibuprofen (ADVIL,MOTRIN) 200 MG tablet Take 400 mg by mouth as needed for headache or moderate pain.  ? Multiple Vitamin (MULITIVITAMIN WITH MINERALS) TABS Take 1 tablet by mouth daily.  ? Omega-3 Fatty Acids (FISH OIL) 1200 MG CAPS Take by mouth.  ? OVER THE COUNTER MEDICATION Take 1 each by mouth daily. [place]  ? OVER THE COUNTER MEDICATION Take 2 each by mouth daily. Cholaplex  ? SUMAtriptan (IMITREX) 100 MG tablet Take 1  tablet (100 mg total) by mouth every 2 (two) hours as needed for migraine or headache. May repeat in 2 hours if needed.  ? Vitamin D, Cholecalciferol, 25 MCG (1000 UT) TABS Take 1,000 Units by mouth 2 (two) times daily.   ? ?No current facility-administered medications for this visit. (Other)  ? ? ? ? ?REVIEW OF SYSTEMS: ?ROS   ?Negative for: Constitutional, Gastrointestinal, Neurological, Skin, Genitourinary, Musculoskeletal, HENT, Endocrine, Cardiovascular, Eyes, Respiratory, Psychiatric, Allergic/Imm, Heme/Lymph ?Last edited by Silvestre Moment on 05/06/2021 10:38 AM.  ?  ? ? ? ?ALLERGIES ?No Known Allergies ? ?PAST MEDICAL HISTORY ?Past Medical History:  ?Diagnosis Date  ? Burning with urination 11/01/2014  ? Hematuria 07/28/2013  ? History of UTI   ? Hyperlipidemia   ? Osteopenia   ? PONV (postoperative nausea and vomiting)   ? Vaginal dryness 12/03/2012  ? ?Past Surgical History:  ?Procedure Laterality Date  ? ABDOMINAL HYSTERECTOMY    ? BREAST BIOPSY Left   ? CLOSED REDUCTION WRIST FRACTURE Right 02/24/2020  ? Procedure: CLOSED REDUCTION WRIST with pin placement;  Surgeon: Carole Civil, MD;  Location: [facility];  Service: Orthopedics;  Laterality: Right;  ? COLONOSCOPY N/A 08/25/2016  ? Procedure: COLONOSCOPY;  Surgeon: Danie Binder, MD;  [facility];  Service: Endoscopy;  Laterality: N/A;  ? DIAGNOSTIC LAPAROSCOPY    ? HARDWARE REMOVAL Right 05/29/2020  ?  Procedure: PIN REMOVAL RIGHT WRIST;  Surgeon: Carole Civil, MD;  [facility]: [facility];  Service: Orthopedics;  Laterality: Right;  ? KNEE ARTHROSCOPY WITH MEDIAL MENISECTOMY  01/26/2012  ? Procedure: KNEE ARTHROSCOPY WITH MEDIAL MENISECTOMY;  Surgeon: Carole Civil, MD;  [facility];  Service: Orthopedics;  Laterality: Left;  ? POLYPECTOMY  08/25/2016  ? Procedure: POLYPECTOMY;  Surgeon: Danie Binder, MD;  [facility];  Service: Endoscopy;;  ? TONSILLECTOMY    ? ? ?FAMILY HISTORY ?Family History  ?Problem Relation Age of Onset  ?  Hypertension Mother   ? Hyperlipidemia Mother   ? Thyroid disease Mother   ? Kidney disease Mother   ? Heart disease Father   ? Hyperlipidemia Father   ? Cancer Maternal Aunt   ? Cancer Maternal Uncle   ? Cancer Paternal Aunt   ? Cancer Paternal Uncle   ? Diabetes Maternal Grandmother   ? Cancer Paternal Grandmother   ?     pancreatic  ? Hyperlipidemia Brother   ?     borderline  ? Cancer Paternal Uncle   ? Alzheimer's disease Paternal Aunt   ? Colon cancer Neg Hx   ? Colon polyps Neg Hx   ? Breast cancer Neg Hx   ? ? ?SOCIAL HISTORY ?Social History  ? ?Tobacco Use  ? Smoking status: Never  ? Smokeless tobacco: Never  ?Vaping Use  ? Vaping Use: Never used  ?Substance Use Topics  ? Alcohol use: Not Currently  ? Drug use: No  ? ?  ? ?  ? ?OPHTHALMIC EXAM: ? ?Base Eye Exam   ? ? Visual Acuity (ETDRS)   ? ?   Right Left  ? Dist [place]  20/60 -1  ? Dist [place] 20/40 -1   ? Dist ph [place]  NI  ? Dist ph cc NI   ? ? Correction: Glasses  ? ?  ?  ? ? Tonometry (Tonopen, 10:47 AM)   ? ?   Right Left  ? Pressure 18 20  ? ?  ?  ? ? Pupils   ? ?   Pupils APD  ? Right PERRL None  ? Left PERRL   ? ?  ?  ? ? Visual Fields   ? ?   Left Right  ?  Full Full  ? ?  ?  ? ? Extraocular Movement   ? ?   Right Left  ?  Full Full  ? ?  ?  ? ? Neuro/Psych   ? ? Oriented x3: Yes  ? ?  ?  ? ? Dilation   ? ? Left eye: 2.5% Phenylephrine, 1.0% Mydriacyl @ 10:47 AM  ? ?  ?  ? ?  ? ?Slit Lamp and Fundus Exam   ? ? External Exam   ? ?   Right Left  ? External Normal Normal  ? ?  ?  ? ? Slit Lamp Exam   ? ?   Right Left  ? Lids/Lashes Normal Normal  ? Conjunctiva/Sclera White and quiet White and quiet  ? Cornea Clear Clear  ? Anterior Chamber Deep and quiet Deep and quiet  ? [place] Round and reactive Round and reactive  ? Lens 2+ Nuclear sclerosis Centered posterior chamber intraocular lens  ? Anterior Vitreous Normal Normal  ? ?  ?  ? ? Fundus Exam   ? ?   Right Left  ? Posterior Vitreous Posterior vitreous detachment Posterior vitreous detachment  ? Disc  Normal  Normal  ? C/D Ratio 0.3 0.3  ? Macula Normal Macular hole, pos Watzke sign  ? Vessels Normal Normal  ? Periphery Normal Normal  ? ?  ?  ? ?  ? ? ?IMAGING AND PROCEDURES  ?Imaging and Procedures for 05/06/21 ? ?OCT, Retina - OU - Both Eyes   ? ?   ?Right Eye ?Quality was good. Scan locations included subfoveal. Central Foveal Thickness: 275. Progression has been stable. Findings include abnormal foveal contour.  ? ?Left Eye ?Quality was good. Scan locations included subfoveal. Central Foveal Thickness: 430. Progression has been stable. Findings include cystoid macular edema, macular hole, abnormal foveal contour.  ? ?Notes ?OD incidental PVD, with with apparent foveal operculum on the posterior hyaloid face concomitant with an attenuated foveal region OD Suggest a spontaneous macular hole developing in the right eye in the past with near spontaneous closure.  Break in the outer [place] on OCT is confirmatory of this finding but nonetheless with preserved good acuity except for the cataract ? ?OS with full-thickness macular hole perifoveal CME ? ?  ? ? ?  ?  ? ?  ?ASSESSMENT/PLAN: ? ?Pseudophakia of left eye ?OS, visual axis now clear to allow for macular hole and its subsequent closure via vitrectomy membrane peel ? ?Patient to continue on current topical medications ? ?Macular hole of left eye ?Preoperative review of expectations of postoperative positioning with the patient.  Reading or prayer position for 3 days postoperatively and sleeping on either side for the first 2 weeks ? ?Patient in the meantime to continue on topical medications left eye as prescribed initially by Dr. Rutherford Guys for the cataract surgery recovery  ? ?  ICD-10-CM   ?1. Macular hole of left eye  H35.342 OCT, Retina - OU - Both Eyes  ?  ?2. Pseudophakia of left eye  Z96.1   ?  ? ? ?1. ? ?2. ? ?3. ? ?Ophthalmic Meds Ordered this visit:  ?No orders of the defined types were placed in this encounter. ? ? ?  ? ?Return ,, [facility],  for As scheduled, vitrectomy membrane [phone] gas injection for macular hole, OS. ? ?Patient Instructions  ?Patient to continue on topical medications as instructed by Dr. Rutherford Guys postop cata

## 2021-05-06 NOTE — Patient Instructions (Addendum)
Patient to continue on topical medications as instructed by Dr. Rutherford Guys postop cataract surgery and preoperative for vitrectomy membrane peel left eye ? ?Patient instructed to be n.p.o., no eating or drinking and after midnight Tuesday, 05/07/2021 except for normal morning medications with small sips of water ?

## 2021-05-08 ENCOUNTER — Encounter (AMBULATORY_SURGERY_CENTER): Payer: 59 | Admitting: Ophthalmology

## 2021-05-08 ENCOUNTER — Ambulatory Visit: Payer: 59

## 2021-05-08 DIAGNOSIS — H35342 Macular cyst, hole, or pseudohole, left eye: Secondary | ICD-10-CM | POA: Diagnosis not present

## 2021-05-09 ENCOUNTER — Encounter (INDEPENDENT_AMBULATORY_CARE_PROVIDER_SITE_OTHER): Payer: 59 | Admitting: Ophthalmology

## 2021-05-09 ENCOUNTER — Encounter (INDEPENDENT_AMBULATORY_CARE_PROVIDER_SITE_OTHER): Payer: Self-pay | Admitting: Ophthalmology

## 2021-05-09 ENCOUNTER — Ambulatory Visit (INDEPENDENT_AMBULATORY_CARE_PROVIDER_SITE_OTHER): Payer: 59 | Admitting: Ophthalmology

## 2021-05-09 DIAGNOSIS — H35342 Macular cyst, hole, or pseudohole, left eye: Secondary | ICD-10-CM

## 2021-05-09 NOTE — Patient Instructions (Signed)
Macular hole surgery was explained with the need for a gas injection. The patient is aware of proper face down position (like looking downward naturally while  reading a book in a lap) for 3-5 days. Patient was advised to not lay on their back while sleeping or resting, usually for 2 weeks. Do not travel to places of elevation while gas bubble is in the eye, usually for 2 weeks  ? ?Ofloxacin  4 times daily to the operative eye ? ?Prednisolone acetate 1 drop to the operative eye 4 times daily ? ?Ketorolac  1 drop to operative eye 4 times daily ? ?Patient instructed not to refill the medications and use them for maximum of 3 weeks. ? ?Patient instructed do not rub the eye.  Patient has the option to use the patch at night.  ?

## 2021-05-09 NOTE — Progress Notes (Signed)
? ? ?05/09/2021 ? ?  ? ?CHIEF COMPLAINT ?Patient presents for  ?Chief Complaint  ?Patient presents with  ? Post-op Follow-up  ? ? ? ? ?HISTORY OF PRESENT ILLNESS: ?Kylie Duffy is a 66 y.o. female who presents to the clinic today for:  ? ?HPI   ? ? Post-op Follow-up   ? ?      ? Laterality: left eye  ? Discomfort: tearing and discharge.  Negative for pain, itching, foreign body sensation and floaters  ? Vision: is worse and is blurred at distance  ? ?  ?  ? ? Comments   ?1 day PO OS, Vitrectomy, membrane peel, gas injection, left eye, sx 05/08/2021. ?Pt states "everything is blurred, it is a little runny. No pain but ache to the touch." ?Pt is currently using eye drops from Dr. Kellie Moor office post cataract sx. ?Prednisolone TID OS ?Ketorolac TID OS ?Ofloxacin TID OS ?  ? ?  ?  ?Last edited by Laurin Coder on 05/09/2021  8:24 AM.  ?  ? ? ?Referring physician: ?Rutherford Guys, MD ?[address] ?[place],  [place] [address] ? ?HISTORICAL INFORMATION:  ? ?Selected notes from the [place] ?  ?   ? ?CURRENT MEDICATIONS: ?Current Outpatient Medications (Ophthalmic Drugs)  ?Medication Sig  ? ketorolac (ACULAR) 0.5 % ophthalmic solution One drop in operative eye(s) 3 times a day starting 2 days before surgery and continuing 3 weeks after surgery.  ? ofloxacin (OCUFLOX) 0.3 % ophthalmic solution Instill 1 drop 3 times a day in operative eye(s) starting 2 days prior to surgery and after surgery for 3 weeks.  ? prednisoLONE acetate (PRED FORTE) 1 % ophthalmic suspension One drop in operative eye(s) 3 times a day starting 2 days before surgery and continuing after surgery for 3 weeks  ? ?No current facility-administered medications for this visit. (Ophthalmic Drugs)  ? ?Current Outpatient Medications (Other)  ?Medication Sig  ? Calcium Carbonate-Vitamin D (CALCIUM 600 + D PO) Take 3 tablets by mouth daily. Takes 3 chewables daily ?Calcifood  ? ibuprofen (ADVIL,MOTRIN) 200 MG tablet Take 400 mg by mouth as needed  for headache or moderate pain.  ? Multiple Vitamin (MULITIVITAMIN WITH MINERALS) TABS Take 1 tablet by mouth daily.  ? Omega-3 Fatty Acids (FISH OIL) 1200 MG CAPS Take by mouth.  ? OVER THE COUNTER MEDICATION Take 1 each by mouth daily. Cyruta  ? OVER THE COUNTER MEDICATION Take 2 each by mouth daily. Cholaplex  ? SUMAtriptan (IMITREX) 100 MG tablet Take 1 tablet (100 mg total) by mouth every 2 (two) hours as needed for migraine or headache. May repeat in 2 hours if needed.  ? Vitamin D, Cholecalciferol, 25 MCG (1000 UT) TABS Take 1,000 Units by mouth 2 (two) times daily.   ? ?No current facility-administered medications for this visit. (Other)  ? ? ? ? ?REVIEW OF SYSTEMS: ?ROS   ?Negative for: Constitutional, Gastrointestinal, Neurological, Skin, Genitourinary, Musculoskeletal, HENT, Endocrine, Cardiovascular, Eyes, Respiratory, Psychiatric, Allergic/Imm, Heme/Lymph ?Last edited by Hurman Horn, MD on 05/09/2021  8:59 AM.  ?  ? ? ? ?ALLERGIES ?No Known Allergies ? ?PAST MEDICAL HISTORY ?Past Medical History:  ?Diagnosis Date  ? Burning with urination 11/01/2014  ? Hematuria 07/28/2013  ? History of UTI   ? Hyperlipidemia   ? Osteopenia   ? PONV (postoperative nausea and vomiting)   ? Vaginal dryness 12/03/2012  ? ?Past Surgical History:  ?Procedure Laterality Date  ? ABDOMINAL HYSTERECTOMY    ?  BREAST BIOPSY Left   ? CLOSED REDUCTION WRIST FRACTURE Right 02/24/2020  ? Procedure: CLOSED REDUCTION WRIST with pin placement;  Surgeon: Carole Civil, MD;  [facility]: [facility];  Service: Orthopedics;  Laterality: Right;  ? COLONOSCOPY N/A 08/25/2016  ? Procedure: COLONOSCOPY;  Surgeon: Danie Binder, MD;  [facility];  Service: Endoscopy;  Laterality: N/A;  ? DIAGNOSTIC LAPAROSCOPY    ? HARDWARE REMOVAL Right 05/29/2020  ? Procedure: PIN REMOVAL RIGHT WRIST;  Surgeon: Carole Civil, MD;  Location: [facility];  Service: Orthopedics;  Laterality: Right;  ? KNEE ARTHROSCOPY WITH MEDIAL MENISECTOMY  01/26/2012  ?  Procedure: KNEE ARTHROSCOPY WITH MEDIAL MENISECTOMY;  Surgeon: Carole Civil, MD;  Location: [facility];  Service: Orthopedics;  Laterality: Left;  ? POLYPECTOMY  08/25/2016  ? Procedure: POLYPECTOMY;  Surgeon: Danie Binder, MD;  [facility];  Service: Endoscopy;;  ? TONSILLECTOMY    ? ? ?FAMILY HISTORY ?Family History  ?Problem Relation Age of Onset  ? Hypertension Mother   ? Hyperlipidemia Mother   ? Thyroid disease Mother   ? Kidney disease Mother   ? Heart disease Father   ? Hyperlipidemia Father   ? Cancer Maternal Aunt   ? Cancer Maternal Uncle   ? Cancer Paternal Aunt   ? Cancer Paternal Uncle   ? Diabetes Maternal Grandmother   ? Cancer Paternal Grandmother   ?     pancreatic  ? Hyperlipidemia Brother   ?     borderline  ? Cancer Paternal Uncle   ? Alzheimer's disease Paternal Aunt   ? Colon cancer Neg Hx   ? Colon polyps Neg Hx   ? Breast cancer Neg Hx   ? ? ?SOCIAL HISTORY ?Social History  ? ?Tobacco Use  ? Smoking status: Never  ? Smokeless tobacco: Never  ?Vaping Use  ? Vaping Use: Never used  ?Substance Use Topics  ? Alcohol use: Not Currently  ? Drug use: No  ? ?  ? ?  ? ?OPHTHALMIC EXAM: ? ?Base Eye Exam   ? ? Visual Acuity (ETDRS)   ? ?   Right Left  ? Dist cc 20/40 -1 CF at 3'  ? Dist ph cc NI NI  ? ? Correction: Glasses  ? ?  ?  ? ? Tonometry (Tonopen, 8:22 AM)   ? ?   Right Left  ? Pressure 11 Unable  ? ? Unable to assess: Yes  ? ?  ?  ? ? Pupils   ? ?   Dark Light APD  ? Right 6 5 None  ? Left Pharma Dilated  None  ? ?  ?  ? ? Visual Fields (Counting fingers)   ? ?   Left Right  ?   Full  ? Restrictions Partial outer superior temporal, inferior temporal, inferior nasal deficiencies   ? ?  ?  ? ? Extraocular Movement   ? ?   Right Left  ?  Full Full  ? ?  ?  ? ? Neuro/Psych   ? ? Oriented x3: Yes  ? Mood/Affect: Normal  ? ?  ?  ? ? Dilation   ? ? Left eye: 1.0% Mydriacyl, 2.5% Phenylephrine @ 8:21 AM  ? ?  ?  ? ?  ? ?Slit Lamp and Fundus Exam   ? ? External Exam   ? ?   Right Left   ? External Normal Normal  ? ?  ?  ? ? Slit  Lamp Exam   ? ?   Right Left  ? Lids/Lashes Normal Normal  ? Conjunctiva/Sclera White and quiet White and quiet  ? Cornea Clear Clear  ? Anterior Chamber Deep and quiet Deep and quiet  ? Iris Round and reactive Round and reactive  ? Lens 2+ Nuclear sclerosis Centered posterior chamber intraocular lens  ? Anterior Vitreous Normal Normal  ? ?  ?  ? ? Fundus Exam   ? ?   Right Left  ? Posterior Vitreous  Clear, avitric, 85% gas  ? Disc  Normal  ? C/D Ratio  0.3  ? Macula  Through the gas hole does appear closed  ? Vessels  Normal  ? Periphery  Normal  ? ?  ?  ? ?  ? ? ?IMAGING AND PROCEDURES  ?Imaging and Procedures for 05/09/21 ? ? ? ?  ?  ? ?  ?ASSESSMENT/PLAN: ? ?Macular hole of left eye ?Postop day #1 status post vitrectomy membrane peel, ILM, injection SF6, looks great, ? ?Upon her,supine positioning hole does appear closed with 20 diopter exam ?  ? ?  ICD-10-CM   ?1. Macular hole of left eye  H35.342   ?  ? ? ?1.  Postop day #1 status post vitrectomy, membrane peel, gas injection looks great.  Patient doing very well with postoperative positioning including last night looking downward. ? ?2.  I have encouraged the patient to get good sleep the sleep on either side at night and resume the reading position throughout the day for the next 3 to 5 days ? ?3. ? ?Ophthalmic Meds Ordered this visit:  ?No orders of the defined types were placed in this encounter. ? ? ?  ? ?Return in about 1 week (around 05/16/2021) for dilate, OS, OCT. ? ?Patient Instructions  ?Macular hole surgery was explained with the need for a gas injection. The patient is aware of proper face down position (like looking downward naturally while  reading a book in a lap) for 3-5 days. Patient was advised to not lay on their back while sleeping or resting, usually for 2 weeks. Do not travel to places of elevation while gas bubble is in the eye, usually for 2 weeks  ? ?Ofloxacin  4 times daily to the operative  eye ? ?Prednisolone acetate 1 drop to the operative eye 4 times daily ? ?Ketorolac  1 drop to operative eye 4 times daily ? ?Patient instructed not to refill the medications and use them for maximum of

## 2021-05-09 NOTE — Assessment & Plan Note (Signed)
Postop day #1 status post vitrectomy membrane peel, ILM, injection SF6, looks great, ? ?Upon her,supine positioning hole does appear closed with 20 diopter exam ?

## 2021-05-14 ENCOUNTER — Encounter (INDEPENDENT_AMBULATORY_CARE_PROVIDER_SITE_OTHER): Payer: Self-pay | Admitting: Ophthalmology

## 2021-05-14 ENCOUNTER — Ambulatory Visit (INDEPENDENT_AMBULATORY_CARE_PROVIDER_SITE_OTHER): Payer: 59 | Admitting: Ophthalmology

## 2021-05-14 DIAGNOSIS — H35342 Macular cyst, hole, or pseudohole, left eye: Secondary | ICD-10-CM | POA: Diagnosis not present

## 2021-05-14 NOTE — Progress Notes (Signed)
? ? ?05/14/2021 ? ?  ? ?CHIEF COMPLAINT ?Patient presents for  ?Chief Complaint  ?Patient presents with  ? Post-op Follow-up  ? ? ? ? ?HISTORY OF PRESENT ILLNESS: ?Kylie Duffy is a 66 y.o. female who presents to the clinic today for:  ? ?HPI   ? ? Post-op Follow-up   ? ?      ? Laterality: left eye  ? Discomfort: Negative for pain, itching, foreign body sensation, tearing and discharge  ? ?  ?  ? ? Comments   ?1 week dilate OS, OCT. Post OP Sx 05/08/2021. (Moved up 1 day per patient request). ?Patient is using Prednisolone, Ketorolac, Ofloxacin all QID OS. ?"I am noticing a semi circle with a dark rim outside of it, all of a sudden it changed. I noticed it when I got up around 4 AM this morning, and it is more noticeable as the morning has gone on. I slept more hours more than I usually do, was not sure if that made a difference. I have been trying to stay in the sitting/ reading position. I have been wearing the eye patch at night. I can see light above it and through it." ? ?  ?  ?Last edited by Laurin Coder on 05/14/2021  9:52 AM.  ?  ? ? ?Referring physician: ?Susy Frizzle, MD ?[id number] [place],  [place] [address] ? ?HISTORICAL INFORMATION:  ? ?Selected notes from the [place] [facility] ?  ?   ? ?CURRENT MEDICATIONS: ?Current Outpatient Medications (Ophthalmic Drugs)  ?Medication Sig  ? ketorolac (ACULAR) 0.5 % ophthalmic solution One drop in operative eye(s) 3 times a day starting 2 days before surgery and continuing 3 weeks after surgery.  ? ofloxacin (OCUFLOX) 0.3 % ophthalmic solution Instill 1 drop 3 times a day in operative eye(s) starting 2 days prior to surgery and after surgery for 3 weeks.  ? prednisoLONE acetate (PRED FORTE) 1 % ophthalmic suspension One drop in operative eye(s) 3 times a day starting 2 days before surgery and continuing after surgery for 3 weeks  ? ?No current facility-administered medications for this visit. (Ophthalmic Drugs)  ? ?Current Outpatient Medications  (Other)  ?Medication Sig  ? Calcium Carbonate-Vitamin D (CALCIUM 600 + D PO) Take 3 tablets by mouth daily. Takes 3 chewables daily ?Calcifood  ? ibuprofen (ADVIL,MOTRIN) 200 MG tablet Take 400 mg by mouth as needed for headache or moderate pain.  ? Multiple Vitamin (MULITIVITAMIN WITH MINERALS) TABS Take 1 tablet by mouth daily.  ? Omega-3 Fatty Acids (FISH OIL) 1200 MG CAPS Take by mouth.  ? OVER THE COUNTER MEDICATION Take 1 each by mouth daily. Cyruta  ? OVER THE COUNTER MEDICATION Take 2 each by mouth daily. Cholaplex  ? SUMAtriptan (IMITREX) 100 MG tablet Take 1 tablet (100 mg total) by mouth every 2 (two) hours as needed for migraine or headache. May repeat in 2 hours if needed.  ? Vitamin D, Cholecalciferol, 25 MCG (1000 UT) TABS Take 1,000 Units by mouth 2 (two) times daily.   ? ?No current facility-administered medications for this visit. (Other)  ? ? ? ? ?REVIEW OF SYSTEMS: ? ? ? ?ALLERGIES ?No Known Allergies ? ?PAST MEDICAL HISTORY ?Past Medical History:  ?Diagnosis Date  ? Burning with urination 11/01/2014  ? Hematuria 07/28/2013  ? History of UTI   ? Hyperlipidemia   ? Osteopenia   ? PONV (postoperative nausea and vomiting)   ? Vaginal dryness 12/03/2012  ? ?  Past Surgical History:  ?Procedure Laterality Date  ? ABDOMINAL HYSTERECTOMY    ? BREAST BIOPSY Left   ? CLOSED REDUCTION WRIST FRACTURE Right 02/24/2020  ? Procedure: CLOSED REDUCTION WRIST with pin placement;  Surgeon: Carole Civil, MD;  Location: [facility];  Service: Orthopedics;  Laterality: Right;  ? COLONOSCOPY N/A 08/25/2016  ? Procedure: COLONOSCOPY;  Surgeon: Danie Binder, MD;  [facility];  Service: Endoscopy;  Laterality: N/A;  ? DIAGNOSTIC LAPAROSCOPY    ? HARDWARE REMOVAL Right 05/29/2020  ? Procedure: PIN REMOVAL RIGHT WRIST;  Surgeon: Carole Civil, MD;  Location: [facility];  Service: Orthopedics;  Laterality: Right;  ? KNEE ARTHROSCOPY WITH MEDIAL MENISECTOMY  01/26/2012  ? Procedure: KNEE ARTHROSCOPY WITH MEDIAL  MENISECTOMY;  Surgeon: Carole Civil, MD;  [facility];  Service: Orthopedics;  Laterality: Left;  ? POLYPECTOMY  08/25/2016  ? Procedure: POLYPECTOMY;  Surgeon: Danie Binder, MD;  [facility];  Service: Endoscopy;;  ? TONSILLECTOMY    ? ? ?FAMILY HISTORY ?Family History  ?Problem Relation Age of Onset  ? Hypertension Mother   ? Hyperlipidemia Mother   ? Thyroid disease Mother   ? Kidney disease Mother   ? Heart disease Father   ? Hyperlipidemia Father   ? Cancer Maternal Aunt   ? Cancer Maternal Uncle   ? Cancer Paternal Aunt   ? Cancer Paternal Uncle   ? Diabetes Maternal Grandmother   ? Cancer Paternal Grandmother   ?     pancreatic  ? Hyperlipidemia Brother   ?     borderline  ? Cancer Paternal Uncle   ? Alzheimer's disease Paternal Aunt   ? Colon cancer Neg Hx   ? Colon polyps Neg Hx   ? Breast cancer Neg Hx   ? ? ?SOCIAL HISTORY ?Social History  ? ?Tobacco Use  ? Smoking status: Never  ? Smokeless tobacco: Never  ?Vaping Use  ? Vaping Use: Never used  ?Substance Use Topics  ? Alcohol use: Not Currently  ? Drug use: No  ? ?  ? ?  ? ?OPHTHALMIC EXAM: ? ?Base Eye Exam   ? ? Visual Acuity (ETDRS)   ? ?   Right Left  ? Dist cc  20/400  ? Dist ph cc  NI  ? ?  ?  ? ? Tonometry (Tonopen, 9:53 AM)   ? ?   Right Left  ? Pressure 14 17  ? ?  ?  ? ? Pupils   ? ?   Pupils Dark Light APD  ? Right PERRL 6 5 None  ? Left PERRL 6 5 None  ? ?  ?  ? ? Extraocular Movement   ? ?   Right Left  ?  Full Full  ? ?  ?  ? ? Neuro/Psych   ? ? Oriented x3: Yes  ? Mood/Affect: Normal  ? ?  ?  ? ? Dilation   ? ? Left eye: 1.0% Mydriacyl, 2.5% Phenylephrine @ 9:53 AM  ? ?  ?  ? ?  ? ?Slit Lamp and Fundus Exam   ? ? External Exam   ? ?   Right Left  ? External Normal Normal  ? ?  ?  ? ? Slit Lamp Exam   ? ?   Right Left  ? Lids/Lashes Normal Normal  ? Conjunctiva/Sclera White and quiet White and quiet  ? Cornea Clear Clear  ? Anterior Chamber Deep and quiet Deep  and quiet  ? Iris Round and reactive Round and reactive   ? Lens 2+ Nuclear sclerosis Centered posterior chamber intraocular lens  ? Anterior Vitreous Normal Normal  ? ?  ?  ? ? Fundus Exam   ? ?   Right Left  ? Posterior Vitreous  Clear, avitric, 55% gas  ? Disc  Normal  ? C/D Ratio  0.3  ? Macula  Through the gas , hole closed with 20 d exam supine  ? Vessels  Normal  ? Periphery  Normal  ? ?  ?  ? ?  ? ? ?IMAGING AND PROCEDURES  ?Imaging and Procedures for 05/14/21 ? ?OCT, Retina - OU - Both Eyes   ? ?   ?Right Eye ?Quality was good. Scan locations included subfoveal. Central Foveal Thickness: 275. Progression has been stable. Findings include abnormal foveal contour.  ? ?Left Eye ?Quality was good. Scan locations included subfoveal. Central Foveal Thickness: 430. Progression has been stable. Findings include cystoid macular edema, macular hole, abnormal foveal contour.  ? ?Notes ?OD incidental PVD, with with apparent foveal operculum on the posterior hyaloid face concomitant with an attenuated foveal region OD Suggest a spontaneous macular hole developing in the right eye in the past with near spontaneous closure.  Break in the outer [place] on OCT is confirmatory of this finding but nonetheless with preserved good acuity except for the cataract ? ?OS view through the gas bubble ? ?  ? ? ?  ?  ? ?  ?ASSESSMENT/PLAN: ? ?Macular hole of left eye ?OS looks great, hole closed via examination.  Patient is able to look face down and able to see a small rock on the  ring on her left hand thus the macula is clearly functioning and I reassured the patient that the gas bubble splitting the center of the vision is triggering her concerns. ? ?  ? ?  ICD-10-CM   ?1. Macular hole of left eye  H35.342 OCT, Retina - OU - Both Eyes  ?  ? ? ?1.  OS looks great macular hole closed.  Gas bubble splitting the center of the vision left eye triggering concerns.  This will improve dramatically over the next 48 hours the gas bubble uncovers the underlying macular region ? ?2. ? ?3. ? ?Ophthalmic  Meds Ordered this visit:  ?No orders of the defined types were placed in this encounter. ? ? ?  ? ?Return in about 1 week (around 05/21/2021) for OS, dilate, POST OP. ? ?Patient Instructions  ?Macular hole su

## 2021-05-14 NOTE — Assessment & Plan Note (Signed)
OS looks great, hole closed via examination.  Patient is able to look face down and able to see a small rock on the  ring on her left hand thus the macula is clearly functioning and I reassured the patient that the gas bubble splitting the center of the vision is triggering her concerns. ? ? ?

## 2021-05-14 NOTE — Patient Instructions (Addendum)
Macular hole surgery was explained with the need for a gas injection. The patient is aware of proper face down position (like looking downward naturally while  reading a book in a lap) for 3-5 days. Patient was advised to not lay on their back while sleeping or resting, usually for 2 weeks. Do not travel to places of elevation while gas bubble is in the eye, usually for 2 weeks  ? ?There is no longer longer requires facedown positioning do not sleep or rest flat on her back for the next 1 week. ? ?Patient to resume unlimited activity, may resume all activity in 4 days with the exception of travel to elevated places such as mountains or sky scrapers for another 8 days ? ?To complete her current medications with no refills to the left eye for 2 weeks whichever comes first ?

## 2021-05-15 ENCOUNTER — Encounter (INDEPENDENT_AMBULATORY_CARE_PROVIDER_SITE_OTHER): Payer: 59 | Admitting: Ophthalmology

## 2021-05-16 ENCOUNTER — Encounter (INDEPENDENT_AMBULATORY_CARE_PROVIDER_SITE_OTHER): Payer: Self-pay | Admitting: Ophthalmology

## 2021-05-21 ENCOUNTER — Ambulatory Visit (INDEPENDENT_AMBULATORY_CARE_PROVIDER_SITE_OTHER): Payer: 59 | Admitting: Ophthalmology

## 2021-05-21 ENCOUNTER — Encounter (INDEPENDENT_AMBULATORY_CARE_PROVIDER_SITE_OTHER): Payer: Self-pay | Admitting: Ophthalmology

## 2021-05-21 DIAGNOSIS — H35342 Macular cyst, hole, or pseudohole, left eye: Secondary | ICD-10-CM | POA: Diagnosis not present

## 2021-05-21 DIAGNOSIS — H35341 Macular cyst, hole, or pseudohole, right eye: Secondary | ICD-10-CM

## 2021-05-21 DIAGNOSIS — H2511 Age-related nuclear cataract, right eye: Secondary | ICD-10-CM

## 2021-05-21 NOTE — Assessment & Plan Note (Signed)
OS vastly improved anatomically and now visually.  Pinhole acuity of 20/30. ? ?This is likely continue to improve over time. ? ?Patient requested to complete medical topical drops left eye over the next 7 days. ?

## 2021-05-21 NOTE — Assessment & Plan Note (Signed)
Thinning of the foveal region of the right eye Noted initial examination in this office 04-23-2021 is unchanged and not likely to change.  This type of attenuation is consistent with a prior spontaneous relief of a small prefoveal portion of the fovea leading to a "lamellar" macular hole.  In the absence of full-thickness macular hole observation alone is most appropriate ?

## 2021-05-21 NOTE — Patient Instructions (Signed)
Patient to complete topical medications in the left eye over the next 7 days.  Patient is not to refill the medications.  Patient is to discard any unused medications at the end of 7 days ?

## 2021-05-21 NOTE — Assessment & Plan Note (Signed)
Follow-up Dr. Gershon Crane for valuation should cataract surgery in the right eye be done to "balance" the 2 eyes refractive leading so as to prevent anisokeinia or anisometropia ?

## 2021-05-21 NOTE — Progress Notes (Signed)
? ? ?05/21/2021 ? ?  ? ?CHIEF COMPLAINT ?Patient presents for  ?Chief Complaint  ?Patient presents with  ? Post-op Follow-up  ? ? ? ? ?HISTORY OF PRESENT ILLNESS: ?Kylie Duffy is a 66 y.o. female who presents to the clinic today for:  ? ?HPI   ?Now some 2 weeks post vitrectomy membrane peel for macular hole closure with gas injection OS, improving acuity and ?Last edited by Hurman Horn, MD on 05/21/2021 10:34 AM.  ?  ? ? ?Referring physician: ?Rutherford Guys, MD ?[address] ?[place],  [place] [address] ? ?HISTORICAL INFORMATION:  ? ?Selected notes from the [place] ?  ?   ? ?CURRENT MEDICATIONS: ?Current Outpatient Medications (Ophthalmic Drugs)  ?Medication Sig  ? ketorolac (ACULAR) 0.5 % ophthalmic solution One drop in operative eye(s) 3 times a day starting 2 days before surgery and continuing 3 weeks after surgery.  ? ofloxacin (OCUFLOX) 0.3 % ophthalmic solution Instill 1 drop 3 times a day in operative eye(s) starting 2 days prior to surgery and after surgery for 3 weeks.  ? prednisoLONE acetate (PRED FORTE) 1 % ophthalmic suspension One drop in operative eye(s) 3 times a day starting 2 days before surgery and continuing after surgery for 3 weeks  ? ?No current facility-administered medications for this visit. (Ophthalmic Drugs)  ? ?Current Outpatient Medications (Other)  ?Medication Sig  ? Calcium Carbonate-Vitamin D (CALCIUM 600 + D PO) Take 3 tablets by mouth daily. Takes 3 chewables daily ?Calcifood  ? ibuprofen (ADVIL,MOTRIN) 200 MG tablet Take 400 mg by mouth as needed for headache or moderate pain.  ? Multiple Vitamin (MULITIVITAMIN WITH MINERALS) TABS Take 1 tablet by mouth daily.  ? Omega-3 Fatty Acids (FISH OIL) 1200 MG CAPS Take by mouth.  ? OVER THE COUNTER MEDICATION Take 1 each by mouth daily. Cyruta  ? OVER THE COUNTER MEDICATION Take 2 each by mouth daily. Cholaplex  ? SUMAtriptan (IMITREX) 100 MG tablet Take 1 tablet (100 mg total) by mouth every 2 (two) hours as needed for  migraine or headache. May repeat in 2 hours if needed.  ? Vitamin D, Cholecalciferol, 25 MCG (1000 UT) TABS Take 1,000 Units by mouth 2 (two) times daily.   ? ?No current facility-administered medications for this visit. (Other)  ? ? ? ? ?REVIEW OF SYSTEMS: ?ROS   ?Negative for: Constitutional, Gastrointestinal, Neurological, Skin, Genitourinary, Musculoskeletal, HENT, Endocrine, Cardiovascular, Eyes, Respiratory, Psychiatric, Allergic/Imm, Heme/Lymph ?Last edited by Hurman Horn, MD on 05/21/2021 10:06 AM.  ?  ? ? ? ?ALLERGIES ?No Known Allergies ? ?PAST MEDICAL HISTORY ?Past Medical History:  ?Diagnosis Date  ? Burning with urination 11/01/2014  ? Hematuria 07/28/2013  ? History of UTI   ? Hyperlipidemia   ? Osteopenia   ? PONV (postoperative nausea and vomiting)   ? Vaginal dryness 12/03/2012  ? ?Past Surgical History:  ?Procedure Laterality Date  ? ABDOMINAL HYSTERECTOMY    ? BREAST BIOPSY Left   ? CLOSED REDUCTION WRIST FRACTURE Right 02/24/2020  ? Procedure: CLOSED REDUCTION WRIST with pin placement;  Surgeon: Carole Civil, MD;  Location: [facility];  Service: Orthopedics;  Laterality: Right;  ? COLONOSCOPY N/A 08/25/2016  ? Procedure: COLONOSCOPY;  Surgeon: Danie Binder, MD;  [facility];  Service: Endoscopy;  Laterality: N/A;  ? DIAGNOSTIC LAPAROSCOPY    ? HARDWARE REMOVAL Right 05/29/2020  ? Procedure: PIN REMOVAL RIGHT WRIST;  Surgeon: Carole Civil, MD;  Location: [facility];  Service: Orthopedics;  Laterality: Right;  ? KNEE ARTHROSCOPY WITH MEDIAL MENISECTOMY  01/26/2012  ? Procedure: KNEE ARTHROSCOPY WITH MEDIAL MENISECTOMY;  Surgeon: Carole Civil, MD;  Location: [facility];  Service: Orthopedics;  Laterality: Left;  ? POLYPECTOMY  08/25/2016  ? Procedure: POLYPECTOMY;  Surgeon: Danie Binder, MD;  [facility];  Service: Endoscopy;;  ? TONSILLECTOMY    ? ? ?FAMILY HISTORY ?Family History  ?Problem Relation Age of Onset  ? Hypertension Mother   ? Hyperlipidemia Mother   ?  Thyroid disease Mother   ? Kidney disease Mother   ? Heart disease Father   ? Hyperlipidemia Father   ? Cancer Maternal Aunt   ? Cancer Maternal Uncle   ? Cancer Paternal Aunt   ? Cancer Paternal Uncle   ? Diabetes Maternal Grandmother   ? Cancer Paternal Grandmother   ?     pancreatic  ? Hyperlipidemia Brother   ?     borderline  ? Cancer Paternal Uncle   ? Alzheimer's disease Paternal Aunt   ? Colon cancer Neg Hx   ? Colon polyps Neg Hx   ? Breast cancer Neg Hx   ? ? ?SOCIAL HISTORY ?Social History  ? ?Tobacco Use  ? Smoking status: Never  ? Smokeless tobacco: Never  ?Vaping Use  ? Vaping Use: Never used  ?Substance Use Topics  ? Alcohol use: Not Currently  ? Drug use: No  ? ?  ? ?  ? ?OPHTHALMIC EXAM: ? ?Base Eye Exam   ? ? Visual Acuity (ETDRS)   ? ?   Right Left  ? Dist [place]  20/60 +2  ? Dist cc 20/40   ? Dist ph [place]  20/40  ? Dist ph cc 20/30 -3   ? ?  ?  ? ? Tonometry (Tonopen, 10:09 AM)   ? ?   Right Left  ? Pressure 18 12  ? ?  ?  ? ? Pupils   ? ?   Pupils APD  ? Right PERRL None  ? Left PERRL None  ? ?  ?  ? ? Visual Fields   ? ?   Left Right  ?  Full Full  ? ?  ?  ? ? Extraocular Movement   ? ?   Right Left  ?  Full, Ortho Full, Ortho  ? ?  ?  ? ? Neuro/Psych   ? ? Oriented x3: Yes  ? Mood/Affect: Normal  ? ?  ?  ? ? Dilation   ? ? Left eye: 1.0% Mydriacyl, 2.5% Phenylephrine @ 10:06 AM  ? ?  ?  ? ?  ? ?Slit Lamp and Fundus Exam   ? ? External Exam   ? ?   Right Left  ? External Normal Normal  ? ?  ?  ? ? Slit Lamp Exam   ? ?   Right Left  ? Lids/Lashes Normal Normal  ? Conjunctiva/Sclera White and quiet White and quiet  ? Cornea Clear Clear  ? Anterior Chamber Deep and quiet Deep and quiet  ? Iris Round and reactive Round and reactive  ? Lens 2+ Nuclear sclerosis Centered posterior chamber intraocular lens  ? Anterior Vitreous Normal Normal  ? ?  ?  ? ? Fundus Exam   ? ?   Right Left  ? Posterior Vitreous  Clear, avitric, 15% gas  ? Disc  Normal  ? C/D Ratio  0.3  ? Macula  Macular hole closed.  ? Vessels  Normal  ? Periphery  Normal  ? ?  ?  ? ?  ? ? ?IMAGING AND PROCEDURES  ?Imaging and Procedures for 05/21/21 ? ?OCT, Retina - OU - Both Eyes   ? ?   ?Right Eye ?Quality was good. Scan locations included subfoveal. Central Foveal Thickness: 279. Progression has been stable. Findings include abnormal foveal contour.  ? ?Left Eye ?Quality was good. Scan locations included subfoveal. Central Foveal Thickness: 352. Progression has been stable. Findings include cystoid macular edema, macular hole, abnormal foveal contour.  ? ?Notes ?OD incidental PVD, with with apparent foveal operculum on the posterior hyaloid face concomitant with an attenuated foveal region OD Suggest a spontaneous macular hole developing in the right eye in the past with near spontaneous closure.  Break in the outer ELM on OCT is confirmatory of this finding but nonetheless with preserved good acuity except for the cataract ? ?OS, macular hole now posed after vitrectomy membrane peel gas injection.  Minor subretinal fluid collection centrally and much less CME. ? ? ? ?  ? ? ?  ?  ? ?  ?ASSESSMENT/PLAN: ? ?Macular hole of left eye ?OS vastly improved anatomically and now visually.  Pinhole acuity of 20/30. ? ?This is likely continue to improve over time. ? ?Patient requested to complete medical topical drops left eye over the next 7 days. ? ?Nuclear sclerotic cataract of right eye ?Follow-up Dr. Gershon Crane for valuation should cataract surgery in the right eye be done to "balance" the 2 eyes refractive leading so as to prevent anisokeinia or anisometropia ? ?Lamellar macular hole of right eye ?Thinning of the foveal region of the right eye Noted initial examination in this office 04-23-2021 is unchanged and not likely to change.  This type of attenuation is consistent with a prior spontaneous relief of a small prefoveal portion of the fovea leading to a "lamellar" macular hole.  In the absence of full-thickness macular hole observation alone is most  appropriate  ? ?  ICD-10-CM   ?1. Macular hole of left eye  H35.342 OCT, Retina - OU - Both Eyes  ?  ?2. Nuclear sclerotic cataract of right eye  H25.11   ?  ?3. Lamellar macular hole of right eye  H35.341   ?  ?

## 2021-05-27 ENCOUNTER — Other Ambulatory Visit (HOSPITAL_COMMUNITY): Payer: Self-pay

## 2021-05-27 DIAGNOSIS — H25811 Combined forms of age-related cataract, right eye: Secondary | ICD-10-CM | POA: Diagnosis not present

## 2021-05-28 ENCOUNTER — Encounter (INDEPENDENT_AMBULATORY_CARE_PROVIDER_SITE_OTHER): Payer: Self-pay | Admitting: Ophthalmology

## 2021-06-03 ENCOUNTER — Encounter (INDEPENDENT_AMBULATORY_CARE_PROVIDER_SITE_OTHER): Payer: Self-pay

## 2021-06-04 ENCOUNTER — Ambulatory Visit (INDEPENDENT_AMBULATORY_CARE_PROVIDER_SITE_OTHER): Payer: 59 | Admitting: Adult Health

## 2021-06-04 ENCOUNTER — Encounter: Payer: Self-pay | Admitting: Adult Health

## 2021-06-04 VITALS — BP 131/84 | HR 72 | Ht 62.5 in | Wt 131.0 lb

## 2021-06-04 DIAGNOSIS — Z1211 Encounter for screening for malignant neoplasm of colon: Secondary | ICD-10-CM | POA: Diagnosis not present

## 2021-06-04 DIAGNOSIS — Z01419 Encounter for gynecological examination (general) (routine) without abnormal findings: Secondary | ICD-10-CM

## 2021-06-04 DIAGNOSIS — Z90710 Acquired absence of both cervix and uterus: Secondary | ICD-10-CM

## 2021-06-04 LAB — HEMOCCULT GUIAC POC 1CARD (OFFICE): Fecal Occult Blood, POC: NEGATIVE

## 2021-06-04 NOTE — Progress Notes (Signed)
Patient ID: Kylie Duffy, female   DOB: 05-02-1955, 66 y.o.   MRN: [id number] ?History of Present Illness: ?Kylie Duffy is a 66 year old white female, married, sp hysterectomy in for a well woman gyn exam. She recently had cataract removed on left and had a macular hole that had to be repaired, and has right eye cataract surgery tomorrow.  ?She is still working, and cares for 44 year old mom. ?PCP is Dr Dennard Schaumann  ? ? ?Current Medications, Allergies, Past Medical History, Past Surgical History, Family History and Social History were reviewed in [employer] record.   ? ? ?Review of Systems: ?Patient denies any headaches, hearing loss, fatigue, blurred vision, shortness of breath, chest pain, abdominal pain, problems with bowel movements, urination, or intercourse. No joint pain or mood swings.  ? ? ? ?Physical Exam:BP 131/84 (BP Location: Left Arm, Patient Position: Sitting, Cuff Size: Normal)   Pulse 72   Ht 5' 2.5" (1.588 m)   Wt 131 lb (59.4 kg)   BMI 23.58 kg/m?   ?General:  Well developed, well nourished, no acute distress ?Skin:  Warm and dry ?Neck:  Midline trachea, normal thyroid, good ROM, no lymphadenopathy,no carotid bruits heard ?Lungs; Clear to auscultation bilaterally ?Breast:  No dominant palpable mass, retraction, or nipple discharge ?Cardiovascular: Regular rate and rhythm ?Abdomen:  Soft, non tender, no hepatosplenomegaly ?Pelvic:  External genitalia is normal in appearance, no lesions.  The vagina is pale with loss of rugae. Urethra has no lesions or masses. The cervix and uterus are absent.  No adnexal masses or tenderness noted.Bladder is non tender, no masses felt. ?Rectal: Good sphincter tone, no polyps, or hemorrhoids felt.  Hemoccult negative. ?Extremities/musculoskeletal:  No swelling,  has spider veins noted, no clubbing or cyanosis ?Psych:  No mood changes, alert and cooperative,seems happy ?AA is 0 ?Fall risk is low ? ?  06/04/2021  ? 11:38 AM 07/20/2020  ? 10:27 AM  04/25/2020  ?  1:50 PM  ?Depression screen PHQ 2/9  ?Decreased Interest 0 0 0  ?Down, Depressed, Hopeless 0 0 0  ?PHQ - 2 Score 0 0 0  ?Altered sleeping 0  0  ?Tired, decreased energy 0  0  ?Change in appetite 0  0  ?Feeling bad or failure about yourself  0  0  ?Trouble concentrating 0  0  ?Moving slowly or fidgety/restless 0  0  ?Suicidal thoughts 0  0  ?PHQ-9 Score 0  0  ?  ? ?  06/04/2021  ? 11:39 AM 04/25/2020  ?  1:51 PM  ?GAD 7 : Generalized Anxiety Score  ?Nervous, Anxious, on Edge 0 0  ?Control/stop worrying 0 0  ?Worry too much - different things 0 0  ?Trouble relaxing 0 0  ?Restless 0 0  ?Easily annoyed or irritable 0 0  ?Afraid - awful might happen 0 0  ?Total GAD 7 Score 0 0  ?Examination chaperoned by Celene Squibb LPN ? ?   ?Impression and Plan: ?1. Encounter for well woman exam with routine gynecological exam ?Physical in 1 year ?Labs with PCP ?Mammograms yearly has one this Friday ?Colonoscopy per GI  ? ?2. Encounter for screening fecal occult blood testing ?Hemoccult negative  ? ?3. S/P hysterectomy ? ? ? ? ?  ?  ?

## 2021-06-05 DIAGNOSIS — H2511 Age-related nuclear cataract, right eye: Secondary | ICD-10-CM | POA: Diagnosis not present

## 2021-06-05 DIAGNOSIS — H25811 Combined forms of age-related cataract, right eye: Secondary | ICD-10-CM | POA: Diagnosis not present

## 2021-06-07 ENCOUNTER — Ambulatory Visit
Admission: RE | Admit: 2021-06-07 | Discharge: 2021-06-07 | Disposition: A | Discharge status: Home / Self Care | Payer: 59 | Source: Ambulatory Visit | Attending: Adult Health | Admitting: Adult Health

## 2021-06-07 DIAGNOSIS — Z1231 Encounter for screening mammogram for malignant neoplasm of breast: Secondary | ICD-10-CM

## 2021-07-15 ENCOUNTER — Encounter (INDEPENDENT_AMBULATORY_CARE_PROVIDER_SITE_OTHER): Payer: Self-pay | Admitting: Ophthalmology

## 2021-07-15 ENCOUNTER — Ambulatory Visit (INDEPENDENT_AMBULATORY_CARE_PROVIDER_SITE_OTHER): Payer: 59 | Admitting: Ophthalmology

## 2021-07-15 DIAGNOSIS — H35341 Macular cyst, hole, or pseudohole, right eye: Secondary | ICD-10-CM | POA: Diagnosis not present

## 2021-07-15 DIAGNOSIS — H43811 Vitreous degeneration, right eye: Secondary | ICD-10-CM | POA: Diagnosis not present

## 2021-07-15 DIAGNOSIS — H35342 Macular cyst, hole, or pseudohole, left eye: Secondary | ICD-10-CM

## 2021-07-15 DIAGNOSIS — Z961 Presence of intraocular lens: Secondary | ICD-10-CM

## 2021-07-15 NOTE — Assessment & Plan Note (Signed)
Again appearance with thin attenuated foveal region of a possible previous spontaneous closure of a macular hole associated with a current PVD which is evident.  Minor epiretinal membrane or ILM tangential traction is possible however we will continue to monitor and follow this region closely as currently the OD is Watzke negative.

## 2021-07-15 NOTE — Progress Notes (Signed)
07/15/2021     CHIEF COMPLAINT Patient presents for  Chief Complaint  Patient presents with   Eye Problem      HISTORY OF PRESENT ILLNESS: Kylie Duffy is a 66 y.o. female who presents to the clinic today for:   HPI   WIP- Possible macular hole in OD per PT. Pt stated, "I noticed a changed in my post-op appnt for my cataract surgery. The same distortion, affecting my central vision and it looks blurry. I visited Dr. Gershon Crane and one of the techs assumed maybe it was a refractive error. It did not improve. I noticed this symptom from my left eye and it started about 2 weeks after my cataract surgery" Pt stated vision is left eye is improving remarkably.  Pt denies FOL but sees occasional floaters.    Last edited by Silvestre Moment on 07/15/2021  3:36 PM.      Referring physician: Rutherford Guys, [place],  [place] [address]  HISTORICAL INFORMATION:   Selected notes from the MEDICAL RECORD NUMBER       CURRENT MEDICATIONS: Current Outpatient Medications (Ophthalmic Drugs)  Medication Sig   ketorolac (ACULAR) 0.5 % ophthalmic solution One drop in operative eye(s) 3 times a day starting 2 days before surgery and continuing 3 weeks after surgery.   No current facility-administered medications for this visit. (Ophthalmic Drugs)   Current Outpatient Medications (Other)  Medication Sig   Calcium Carbonate-Vitamin D (CALCIUM 600 + D PO) Take 3 tablets by mouth daily. Takes 3 chewables daily Calcifood   ibuprofen (ADVIL,MOTRIN) 200 MG tablet Take 400 mg by mouth as needed for headache or moderate pain.   Multiple Vitamin (MULITIVITAMIN WITH MINERALS) TABS Take 1 tablet by mouth daily.   Omega-3 Fatty Acids (FISH OIL) 1200 MG CAPS Take by mouth.   OVER THE COUNTER MEDICATION Take 1 each by mouth daily. Cyruta   OVER THE COUNTER MEDICATION Take 2 each by mouth daily. Cholaplex   SUMAtriptan (IMITREX) 100 MG tablet Take 1 tablet (100 mg total) by mouth every 2 (two)  hours as needed for migraine or headache. May repeat in 2 hours if needed.   Vitamin D, Cholecalciferol, 25 MCG (1000 UT) TABS Take 1,000 Units by mouth 2 (two) times daily.    No current facility-administered medications for this visit. (Other)      REVIEW OF SYSTEMS: ROS   Negative for: Constitutional, Gastrointestinal, Neurological, Skin, Genitourinary, Musculoskeletal, HENT, Endocrine, Cardiovascular, Eyes, Respiratory, Psychiatric, Allergic/Imm, Heme/Lymph Last edited by Silvestre Moment on 07/15/2021  3:35 PM.       ALLERGIES No Known Allergies  PAST MEDICAL HISTORY 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 might limit the vision to less than 20/20   Osteopenia    PONV (postoperative nausea and vomiting)    Vaginal dryness 12/03/2012   Past Surgical History:  Procedure Laterality Date   ABDOMINAL HYSTERECTOMY     BREAST BIOPSY Left    BREAST EXCISIONAL BIOPSY Right    age 2s benign   CLOSED REDUCTION WRIST FRACTURE Right 02/24/2020   Procedure: CLOSED REDUCTION WRIST with pin placement;  Surgeon: Carole Civil, MD;  Location: [facility];  Service: Orthopedics;  Laterality: Right;   COLONOSCOPY N/A 08/25/2016   Procedure: COLONOSCOPY;  Surgeon: Danie Binder, MD;  [facility];  Service: Endoscopy;  Laterality: N/A;   DIAGNOSTIC LAPAROSCOPY     HARDWARE REMOVAL Right 05/29/2020   Procedure: PIN REMOVAL RIGHT WRIST;  Surgeon: Carole Civil, MD;  Location: [facility];  Service: Orthopedics;  Laterality: Right;   KNEE ARTHROSCOPY WITH MEDIAL MENISECTOMY  01/26/2012   Procedure: KNEE ARTHROSCOPY WITH MEDIAL MENISECTOMY;  Surgeon: Carole Civil, MD;  Location: [facility];  Service: Orthopedics;  Laterality: Left;   POLYPECTOMY  08/25/2016   Procedure: POLYPECTOMY;  Surgeon: Danie Binder, MD;  [facility];  Service: Endoscopy;;   TONSILLECTOMY      FAMILY HISTORY Family History  Problem Relation Age of Onset   Hypertension Mother    Hyperlipidemia Mother    Thyroid disease Mother    Kidney disease Mother    Heart disease Father    Hyperlipidemia Father    Cancer Maternal Aunt    Cancer Maternal Uncle    Cancer Paternal Aunt    Cancer Paternal Uncle    Diabetes Maternal Grandmother    Cancer Paternal Grandmother        pancreatic   Hyperlipidemia Brother        borderline   Cancer Paternal Uncle    Alzheimer's disease Paternal Aunt    Colon cancer Neg Hx    Colon polyps Neg Hx    Breast cancer Neg Hx     SOCIAL HISTORY Social History   Tobacco Use   Smoking status: Never   Smokeless tobacco: Never  Vaping Use   Vaping Use: Never used  Substance Use Topics   Alcohol use: Not Currently   Drug use: No         OPHTHALMIC EXAM:  Base Eye Exam     Visual Acuity (ETDRS)       Right Left   Dist [place] 20/70 20/40   Dist ph [place] 20/40 -1 20/30 -2  Pt stated vision there is a blurry spot in the center in OD.        Tonometry (Tonopen, 3:43 PM)       Right Left   Pressure 12 10         Pupils       Pupils APD   Right PERRL None   Left PERRL None         Visual Fields       Left Right    Full          Extraocular Movement       Right Left    Full Full         Neuro/Psych     Oriented x3: Yes   Mood/Affect: Normal         Dilation     Right eye: 2.5% Phenylephrine, 1.0% Mydriacyl @ 3:43 PM           Slit Lamp and Fundus Exam     External Exam       Right Left   External Normal Normal         Slit Lamp Exam       Right Left   Lids/Lashes Normal Normal   Conjunctiva/Sclera White and quiet White and quiet   Cornea Clear Clear   Anterior Chamber Deep and quiet  Deep and quiet   Iris Round and reactive Round and reactive  Lens 2+ Nuclear sclerosis Centered posterior chamber intraocular lens   Anterior Vitreous Normal Normal         Fundus Exam       Right Left   Posterior Vitreous Posterior vitreous detachment Clear, avitric   Disc Normal Normal   C/D Ratio 0.3 0.3   Macula No macular hole, clinically lamellar thinning region but no full-thickness break negative Watzke sign Macular hole closed.   Vessels Normal Normal   Periphery Normal Normal            IMAGING AND PROCEDURES  Imaging and Procedures for 07/15/21  OCT, Retina - OU - Both Eyes       Right Eye Quality was good. Scan locations included subfoveal. Central Foveal Thickness: 278. Progression has been stable. Findings include abnormal foveal contour.   Left Eye Quality was good. Scan locations included subfoveal. Central Foveal Thickness: 359. Progression has been stable. Findings include abnormal foveal contour, cystoid macular edema, macular hole.   Notes OD incidental PVD, with with apparent foveal operculum on the posterior hyaloid face concomitant with an attenuated foveal region OD Suggest a spontaneous macular hole developing in the right eye in the past with near spontaneous closure.  Break in the outer ELM on OCT is confirmatory of this finding but nonetheless with preserved good acuity except for the cataract, with no break, no full thickness hole OD  OS, macular hole now posed after vitrectomy membrane peel gas injection.  With no intraretinal fluid no CME remaining OS.       Color Fundus Photography Optos - OU - Both Eyes       Right Eye Progression has been stable. Macula : normal observations. Vessels : normal observations. Periphery : normal observations.   Left Eye Progression has been stable. Disc findings include normal observations. Macula : normal observations. Vessels : normal observations. Periphery : normal observations.    Notes Lamellar macular hole OD but no full-thickness findings             ASSESSMENT/PLAN:  Lamellar macular hole of right eye Again appearance with thin attenuated foveal region of a possible previous spontaneous closure of a macular hole associated with a current PVD which is evident.  Minor epiretinal membrane or ILM tangential traction is possible however we will continue to monitor and follow this region closely as currently the OD is Watzke negative.  Macular hole of left eye OS looks great some 2 months post recent vitrectomy membrane peel gas injection for macular hole full-thickness hole  Posterior vitreous detachment of right eye Stable OD  Pseudophakia of right eye OD looks great, lamellar macular hole was seen prior to cataract surgery.  Gratefully the cataract is now otherwise showed a macular hole develop in the right eye.  The clinical and OCT findings are most compatible with a previous spontaneous closure of a macular hole with recent PVD.  With good acuity, recommend observe OD with pinhole acuity of 20/40.     ICD-10-CM   1. Lamellar macular hole of right eye  H35.341     2. Posterior vitreous detachment of right eye  H43.811     3. Macular hole of left eye  H35.342 OCT, Retina - OU - Both Eyes    Color Fundus Photography Optos - OU - Both Eyes    4. Pseudophakia of right eye  Z96.1       1.  OS looks great 2 months post macular hole repair with great acuity recovery near  normal macular thickness  2.  OD likely a previous partial macular hole with spontaneous closure with attenuation remnant accounting for the acuity post cataract surgery.  No active maculopathy no active ERM no active tangential traction.  No change over time.  I thus with good acuity I recommend observe OD these findings reviewed with the patient  3.  Ophthalmic Meds Ordered this visit:  No orders of the defined types were placed in this encounter.      Return in about 7  weeks (around 09/02/2021) for DILATE OU, OCT.  There are no Patient Instructions on file for this visit.   Explained the diagnoses, plan, and follow up with the patient and they expressed understanding.  Patient expressed understanding of the importance of proper follow up care.   Clent Demark Allesandra Huebsch M.D. Diseases & Surgery of the Retina and Vitreous [facility] [place] 07/15/21     Abbreviations: M myopia (nearsighted); A astigmatism; H hyperopia (farsighted); P presbyopia; Mrx spectacle prescription;  CTL contact lenses; OD right eye; OS left eye; OU both eyes  XT exotropia; ET esotropia; PEK punctate epithelial keratitis; PEE punctate epithelial erosions; DES dry eye syndrome; MGD meibomian gland dysfunction; ATs artificial tears; PFAT's preservative free artificial tears; [place] nuclear sclerotic cataract; PSC posterior subcapsular cataract; ERM epi-retinal membrane; PVD posterior vitreous detachment; RD retinal detachment; DM diabetes mellitus; DR diabetic retinopathy; NPDR non-proliferative diabetic retinopathy; PDR proliferative diabetic retinopathy; CSME clinically significant macular edema; DME diabetic macular edema; dbh dot blot hemorrhages; CWS cotton wool spot; POAG primary open angle glaucoma; C/D cup-to-disc ratio; HVF humphrey visual field; GVF goldmann visual field; OCT optical coherence tomography; IOP intraocular pressure; BRVO Branch retinal vein occlusion; CRVO central retinal vein occlusion; CRAO central retinal artery occlusion; BRAO branch retinal artery occlusion; RT retinal tear; SB scleral buckle; PPV pars plana vitrectomy; VH Vitreous hemorrhage; PRP panretinal laser photocoagulation; IVK intravitreal kenalog; VMT vitreomacular traction; MH Macular hole;  NVD neovascularization of the disc; NVE neovascularization elsewhere; AREDS age related eye disease study; ARMD age related macular degeneration; POAG primary open angle glaucoma; EBMD epithelial/anterior basement membrane  dystrophy; ACIOL anterior chamber intraocular lens; IOL intraocular lens; PCIOL posterior chamber intraocular lens; Phaco/IOL phacoemulsification with intraocular lens placement; [place] photorefractive keratectomy; LASIK laser assisted in situ keratomileusis; HTN hypertension; DM diabetes mellitus; COPD chronic obstructive pulmonary disease

## 2021-07-15 NOTE — Assessment & Plan Note (Signed)
OS looks great some 2 months post recent vitrectomy membrane peel gas injection for macular hole full-thickness hole

## 2021-07-15 NOTE — Assessment & Plan Note (Signed)
OD looks great, lamellar macular hole was seen prior to cataract surgery.  Gratefully the cataract is now otherwise showed a macular hole develop in the right eye.  The clinical and OCT findings are most compatible with a previous spontaneous closure of a macular hole with recent PVD.  With good acuity, recommend observe OD with pinhole acuity of 20/40.

## 2021-07-15 NOTE — Assessment & Plan Note (Signed)
Stable OD °

## 2021-07-17 ENCOUNTER — Encounter (INDEPENDENT_AMBULATORY_CARE_PROVIDER_SITE_OTHER): Payer: 59 | Admitting: Ophthalmology

## 2021-07-18 ENCOUNTER — Other Ambulatory Visit: Payer: 59

## 2021-07-18 DIAGNOSIS — E785 Hyperlipidemia, unspecified: Secondary | ICD-10-CM

## 2021-07-19 ENCOUNTER — Encounter: Payer: Self-pay | Admitting: *Deleted

## 2021-07-19 ENCOUNTER — Other Ambulatory Visit: Payer: Self-pay

## 2021-07-19 LAB — LIPID PANEL
Cholesterol: 242 mg/dL — ABNORMAL HIGH (ref <200)
HDL: 64 mg/dL (ref >=50)
LDL Cholesterol (Calc): 160 mg/dL (calc) — ABNORMAL HIGH
Non-HDL Cholesterol (Calc): 178 mg/dL (calc) — ABNORMAL HIGH (ref <130)
Total CHOL/HDL Ratio: 3.8 (calc) (ref <5.0)
Triglycerides: 74 mg/dL (ref <150)

## 2021-07-19 LAB — CBC WITH DIFFERENTIAL/PLATELET
Absolute Monocytes: 398 cells/uL (ref 200–950)
Basophils Absolute: 98 cells/uL (ref 0–200)
Basophils Relative: 1.3 %
Eosinophils Absolute: 128 cells/uL (ref 15–500)
Eosinophils Relative: 1.7 %
HCT: 41.3 % (ref 35.0–45.0)
Hemoglobin: 14 g/dL (ref 11.7–15.5)
Lymphs Abs: 1905 cells/uL (ref 850–3900)
MCH: 29.7 pg (ref 27.0–33.0)
MCHC: 33.9 g/dL (ref 32.0–36.0)
MCV: 87.7 fL (ref 80.0–100.0)
MPV: 10.4 fL (ref 7.5–12.5)
Monocytes Relative: 5.3 %
Neutro Abs: 4973 cells/uL (ref 1500–7800)
Neutrophils Relative %: 66.3 %
Platelets: 546 10*3/uL — ABNORMAL HIGH (ref 140–400)
RBC: 4.71 10*6/uL (ref 3.80–5.10)
RDW: 13.4 % (ref 11.0–15.0)
Total Lymphocyte: 25.4 %
WBC: 7.5 10*3/uL (ref 3.8–10.8)

## 2021-07-19 LAB — COMPREHENSIVE METABOLIC PANEL
AG Ratio: 1.8 (calc) (ref 1.0–2.5)
ALT: 15 U/L (ref 6–29)
AST: 17 U/L (ref 10–35)
Albumin: 4.2 g/dL (ref 3.6–5.1)
Alkaline phosphatase (APISO): 65 U/L (ref 37–153)
BUN: 20 mg/dL (ref 7–25)
CO2: 25 mmol/L (ref 20–32)
Calcium: 9 mg/dL (ref 8.6–10.4)
Chloride: 104 mmol/L (ref 98–110)
Creat: 0.82 mg/dL (ref 0.50–1.05)
Globulin: 2.3 g/dL (calc) (ref 1.9–3.7)
Glucose, Bld: 88 mg/dL (ref 65–99)
Potassium: 4.9 mmol/L (ref 3.5–5.3)
Sodium: 140 mmol/L (ref 135–146)
Total Bilirubin: 0.4 mg/dL (ref 0.2–1.2)
Total Protein: 6.5 g/dL (ref 6.1–8.1)

## 2021-07-26 ENCOUNTER — Encounter: Payer: Self-pay | Admitting: Family Medicine

## 2021-07-26 ENCOUNTER — Ambulatory Visit (INDEPENDENT_AMBULATORY_CARE_PROVIDER_SITE_OTHER): Payer: 59 | Admitting: Family Medicine

## 2021-07-26 VITALS — BP 100/72 | HR 77 | Temp 97.9°F | Ht 62.0 in | Wt 129.0 lb

## 2021-07-26 DIAGNOSIS — Z90710 Acquired absence of both cervix and uterus: Secondary | ICD-10-CM

## 2021-07-26 DIAGNOSIS — Z0000 Encounter for general adult medical examination without abnormal findings: Secondary | ICD-10-CM | POA: Diagnosis not present

## 2021-07-26 DIAGNOSIS — E785 Hyperlipidemia, unspecified: Secondary | ICD-10-CM

## 2021-07-26 NOTE — Progress Notes (Signed)
Subjective:    Patient ID: Kylie Duffy, female    DOB: Apr 23, 1955, 66 y.o.   MRN: [id number]  HPI Patient is a very pleasant 66 year old Caucasian female here today for complete physical exam.  Patient had a mammogram in May of this year.  Last colonoscopy for the patient was in 2018.  They did find a tubular adenoma.  Her gastroenterologist recommended a repeat colonoscopy in 5 to 10 years.  Therefore the earliest time she will be due this year.  Patient had a bone density test last year and her T score was -2.4.  She is borderline for osteoporosis. Lab on 07/18/2021  Component Date Value Ref Range Status   WBC 07/18/2021 7.5  3.8 - 10.8 Thousand/uL Final   RBC 07/18/2021 4.71  3.80 - 5.10 Million/uL Final   Hemoglobin 07/18/2021 14.0  11.7 - 15.5 g/dL Final   HCT 07/18/2021 41.3  35.0 - 45.0 % Final   MCV 07/18/2021 87.7  80.0 - 100.0 fL Final   MCH 07/18/2021 29.7  27.0 - 33.0 pg Final   MCHC 07/18/2021 33.9  32.0 - 36.0 g/dL Final   RDW 07/18/2021 13.4  11.0 - 15.0 % Final   Platelets 07/18/2021 546 (H)  140 - 400 Thousand/uL Final   MPV 07/18/2021 10.4  7.5 - 12.5 fL Final   Neutro Abs 07/18/2021 4,973  1,500 - 7,800 cells/uL Final   Lymphs Abs 07/18/2021 1,905  850 - 3,900 cells/uL Final   Absolute Monocytes 07/18/2021 398  200 - 950 cells/uL Final   Eosinophils Absolute 07/18/2021 128  15 - 500 cells/uL Final   Basophils Absolute 07/18/2021 98  0 - 200 cells/uL Final   Neutrophils Relative % 07/18/2021 66.3  % Final   Total Lymphocyte 07/18/2021 25.4  % Final   Monocytes Relative 07/18/2021 5.3  % Final   Eosinophils Relative 07/18/2021 1.7  % Final   Basophils Relative 07/18/2021 1.3  % Final   Glucose, Bld 07/18/2021 88  65 - 99 mg/dL Final   Comment: .            Fasting reference interval .    BUN 07/18/2021 20  7 - 25 mg/dL Final   Creat 07/18/2021 0.82  0.50 - 1.05 mg/dL Final   BUN/Creatinine Ratio 27/03/5007 NOT APPLICABLE  6 - 22 (calc) Final   Sodium  07/18/2021 140  135 - 146 mmol/L Final   Potassium 07/18/2021 4.9  3.5 - 5.3 mmol/L Final   Chloride 07/18/2021 104  98 - 110 mmol/L Final   CO2 07/18/2021 25  20 - 32 mmol/L Final   Calcium 07/18/2021 9.0  8.6 - 10.4 mg/dL Final   Total Protein 07/18/2021 6.5  6.1 - 8.1 g/dL Final   Albumin 07/18/2021 4.2  3.6 - 5.1 g/dL Final   Globulin 07/18/2021 2.3  1.9 - 3.7 g/dL (calc) Final   AG Ratio 07/18/2021 1.8  1.0 - 2.5 (calc) Final   Total Bilirubin 07/18/2021 0.4  0.2 - 1.2 mg/dL Final   Alkaline phosphatase (APISO) 07/18/2021 65  37 - 153 U/L Final   AST 07/18/2021 17  10 - 35 U/L Final   ALT 07/18/2021 15  6 - 29 U/L Final   Cholesterol 07/18/2021 242 (H)  <200 mg/dL Final   HDL 07/18/2021 64  > OR = 50 mg/dL Final   Triglycerides 07/18/2021 74  <150 mg/dL Final   LDL Cholesterol (Calc) 07/18/2021 160 (H)  mg/dL (calc) Final   Comment: Reference range: <  100 . Desirable range <100 mg/dL for primary prevention;   <70 mg/dL for patients with CHD or diabetic patients  with > or = 2 CHD risk factors. Marland Kitchen LDL-C is now calculated using the Martin-Hopkins  calculation, which is a validated novel method providing  better accuracy than the Friedewald equation in the  estimation of LDL-C.  Cresenciano Genre et al. Annamaria Helling. 5284;132(44): 2061-2068  (http://education.[facility].com/faq/FAQ164)    Total CHOL/HDL Ratio 07/18/2021 3.8  <5.0 (calc) Final   Non-HDL Cholesterol (Calc) 07/18/2021 178 (H)  <130 mg/dL (calc) Final   Comment: For patients with diabetes plus 1 major ASCVD risk  factor, treating to a non-HDL-C goal of <100 mg/dL  (LDL-C of <70 mg/dL) is considered a therapeutic  option.     Past Medical History:  Diagnosis Date   Burning with urination 11/01/2014   Hematuria 07/28/2013   History of UTI    Hyperlipidemia    Nuclear sclerotic cataract of right eye 04/23/2021   Dense cataract, some contribution to the poor vision in the right eye, although ultimately the previous  spontaneous macular hole and closure in the right eye might limit the vision to less than 20/20   Osteopenia    PONV (postoperative nausea and vomiting)    Vaginal dryness 12/03/2012   Past Surgical History:  Procedure Laterality Date   ABDOMINAL HYSTERECTOMY     BREAST BIOPSY Left    BREAST EXCISIONAL BIOPSY Right    age 48s benign   CLOSED REDUCTION WRIST FRACTURE Right 02/24/2020   Procedure: CLOSED REDUCTION WRIST with pin placement;  Surgeon: Carole Civil, MD;  Location: [facility];  Service: Orthopedics;  Laterality: Right;   COLONOSCOPY N/A 08/25/2016   Procedure: COLONOSCOPY;  Surgeon: Danie Binder, MD;  Location: [facility];  Service: Endoscopy;  Laterality: N/A;   DIAGNOSTIC LAPAROSCOPY     HARDWARE REMOVAL Right 05/29/2020   Procedure: PIN REMOVAL RIGHT WRIST;  Surgeon: Carole Civil, MD;  Location: [facility];  Service: Orthopedics;  Laterality: Right;   KNEE ARTHROSCOPY WITH MEDIAL MENISECTOMY  01/26/2012   Procedure: KNEE ARTHROSCOPY WITH MEDIAL MENISECTOMY;  Surgeon: Carole Civil, MD;  Location: [facility];  Service: Orthopedics;  Laterality: Left;   POLYPECTOMY  08/25/2016   Procedure: POLYPECTOMY;  Surgeon: Danie Binder, MD;  Location: [facility];  Service: Endoscopy;;   TONSILLECTOMY     Current Outpatient Medications on File Prior to Visit  Medication Sig Dispense Refill   Calcium Carbonate-Vitamin D (CALCIUM 600 + D PO) Take 3 tablets by mouth daily. Takes 3 chewables daily Calcifood     ibuprofen (ADVIL,MOTRIN) 200 MG tablet Take 400 mg by mouth as needed for headache or moderate pain.     ketorolac (ACULAR) 0.5 % ophthalmic solution One drop in operative eye(s) 3 times a day starting 2 days before surgery and continuing 3 weeks after surgery. 5 mL 1   Multiple Vitamin (MULITIVITAMIN WITH MINERALS) TABS Take 1 tablet by mouth daily.     Omega-3 Fatty Acids (FISH OIL) 1200 MG CAPS Take by mouth.     OVER THE COUNTER MEDICATION Take 1 each by mouth  daily. Cyruta     OVER THE COUNTER MEDICATION Take 2 each by mouth daily. Cholaplex     SUMAtriptan (IMITREX) 100 MG tablet Take 1 tablet (100 mg total) by mouth every 2 (two) hours as needed for migraine or headache. May repeat in 2 hours if needed. 10 tablet 1   Vitamin D, Cholecalciferol, 25  MCG (1000 UT) TABS Take 1,000 Units by mouth 2 (two) times daily.      No current facility-administered medications on file prior to visit.   No Known Allergies Social History   Socioeconomic History   Marital status: Married    Spouse name: Not on file   Number of children: Not on file   Years of education: Not on file   Highest education level: Not on file  Occupational History   Not on file  Tobacco Use   Smoking status: Never   Smokeless tobacco: Never  Vaping Use   Vaping Use: Never used  Substance and Sexual Activity   Alcohol use: Not Currently   Drug use: No   Sexual activity: Yes    Birth control/protection: Surgical, Post-menopausal    Comment: hyst  Other Topics Concern   Not on file  Social History Narrative   Not on file   Social Determinants of Health   Financial Resource Strain: Low Risk  (06/04/2021)   Overall Financial Resource Strain (CARDIA)    Difficulty of Paying Living Expenses: Not very hard  Food Insecurity: No Food Insecurity (06/04/2021)   Hunger Vital Sign    Worried About Running Out of Food in the Last Year: Never true    Ran Out of Food in the Last Year: Never true  Transportation Needs: No Transportation Needs (06/04/2021)   PRAPARE - Hydrologist (Medical): No    Lack of Transportation (Non-Medical): No  Physical Activity: Sufficiently Active (06/04/2021)   Exercise Vital Sign    Days of Exercise per Week: 5 days    Minutes of Exercise per Session: 30 min  Stress: No Stress Concern Present (06/04/2021)   [place]    Feeling of Stress : Not at all  Social  Connections: Moderately Integrated (06/04/2021)   Social Connection and Isolation Panel [NHANES]    Frequency of Communication with Friends and Family: More than three times a week    Frequency of Social Gatherings with Friends and Family: Once a week    Attends Religious Services: More than 4 times per year    Active Member of [employer] or Organizations: No    Attends Archivist Meetings: Never    Marital Status: Married  Human resources officer Violence: Not At Risk (06/04/2021)   Humiliation, Afraid, Rape, and Kick questionnaire    Fear of Current or Ex-Partner: No    Emotionally Abused: No    Physically Abused: No    Sexually Abused: No   Family History  Problem Relation Age of Onset   Hypertension Mother    Hyperlipidemia Mother    Thyroid disease Mother    Kidney disease Mother    Heart disease Father    Hyperlipidemia Father    Cancer Maternal Aunt    Cancer Maternal Uncle    Cancer Paternal Aunt    Cancer Paternal Uncle    Diabetes Maternal Grandmother    Cancer Paternal Grandmother        pancreatic   Hyperlipidemia Brother        borderline   Cancer Paternal Uncle    Alzheimer's disease Paternal Aunt    Colon cancer Neg Hx    Colon polyps Neg Hx    Breast cancer Neg Hx       Review of Systems  All other systems reviewed and are negative.      Objective:   Physical Exam Vitals  reviewed.  Constitutional:      General: She is not in acute distress.    Appearance: Normal appearance. She is normal weight. She is not ill-appearing, toxic-appearing or diaphoretic.  HENT:     Head: Normocephalic and atraumatic.     Right Ear: Tympanic membrane, ear canal and external ear normal. There is no impacted cerumen.     Left Ear: Tympanic membrane, ear canal and external ear normal. There is no impacted cerumen.     Nose: Nose normal. No congestion or rhinorrhea.     Mouth/Throat:     Mouth: Mucous membranes are moist.     Pharynx: Oropharynx is clear. No oropharyngeal  exudate or posterior oropharyngeal erythema.  Eyes:     Extraocular Movements: Extraocular movements intact.     Conjunctiva/sclera: Conjunctivae normal.     Pupils: Pupils are equal, round, and reactive to light.  Neck:     Vascular: No carotid bruit.  Cardiovascular:     Rate and Rhythm: Normal rate and regular rhythm.     Pulses: Normal pulses.     Heart sounds: Normal heart sounds. No murmur heard.    No friction rub. No gallop.  Pulmonary:     Effort: Pulmonary effort is normal. No respiratory distress.     Breath sounds: Normal breath sounds. No stridor. No wheezing, rhonchi or rales.  Abdominal:     General: Abdomen is flat. Bowel sounds are normal. There is no distension.     Palpations: Abdomen is soft. There is no mass.     Tenderness: There is no abdominal tenderness. There is no guarding or rebound.  Musculoskeletal:        General: No swelling or tenderness. Normal range of motion.     Cervical back: Normal range of motion. No rigidity or tenderness.     Right lower leg: No edema.     Left lower leg: No edema.  Lymphadenopathy:     Cervical: No cervical adenopathy.  Skin:    General: Skin is warm.     Coloration: Skin is not jaundiced or pale.     Findings: No bruising, erythema, lesion or rash.  Neurological:     General: No focal deficit present.     Mental Status: She is alert and oriented to person, place, and time. Mental status is at baseline.     Cranial Nerves: No cranial nerve deficit.     Sensory: No sensory deficit.     Motor: No weakness.     Coordination: Coordination normal.     Gait: Gait normal.     Deep Tendon Reflexes: Reflexes normal.  Psychiatric:        Mood and Affect: Mood normal.        Behavior: Behavior normal.        Thought Content: Thought content normal.        Judgment: Judgment normal.          Assessment & Plan:  Routine general medical examination at a health care facility  S/P hysterectomy  Hyperlipidemia,  unspecified hyperlipidemia type Mammogram is up-to-date.  We discussed the colonoscopy and patient would like to wait until 7 years out which will be in 2 years.  We discussed her bone density.  I discussed starting her on Fosamax but she prefers to continue calcium and vitamin D and perform more weightbearing exercise.  We will recheck a bone density test next year.  We discussed her cholesterol and I strongly recommended starting her on  a statin.  Patient is hesitant to start a statin.  She would like to try exercise.  We did discuss obtaining a coronary artery calcium score to determine her risk to help decide if she would benefit from a statin.  She will consider this.  I recommended Prevnar 20.  I recommended Shingrix.  I recommended a seasonal flu shot.  Regular anticipatory guidance is provided

## 2021-08-20 ENCOUNTER — Encounter (INDEPENDENT_AMBULATORY_CARE_PROVIDER_SITE_OTHER): Payer: 59 | Admitting: Ophthalmology

## 2021-09-02 ENCOUNTER — Ambulatory Visit (INDEPENDENT_AMBULATORY_CARE_PROVIDER_SITE_OTHER): Payer: 59 | Admitting: Ophthalmology

## 2021-09-02 ENCOUNTER — Encounter (INDEPENDENT_AMBULATORY_CARE_PROVIDER_SITE_OTHER): Payer: 59 | Admitting: Ophthalmology

## 2021-09-02 ENCOUNTER — Encounter (INDEPENDENT_AMBULATORY_CARE_PROVIDER_SITE_OTHER): Payer: Self-pay | Admitting: Ophthalmology

## 2021-09-02 DIAGNOSIS — H43813 Vitreous degeneration, bilateral: Secondary | ICD-10-CM | POA: Diagnosis not present

## 2021-09-02 DIAGNOSIS — H43811 Vitreous degeneration, right eye: Secondary | ICD-10-CM

## 2021-09-02 DIAGNOSIS — H35371 Puckering of macula, right eye: Secondary | ICD-10-CM | POA: Diagnosis not present

## 2021-09-02 NOTE — Assessment & Plan Note (Signed)
With good acuity will continue to monitor and observe closely OD.  Likely however this is creating a small paracentral scotoma with the patient is noticing.  If any advancement of this condition worsens symptomatically we may in fact need to offer vitrectomy membrane peel as the tangential traction in a centripetal fashion can lead to attenuation of the foveal tissue

## 2021-09-02 NOTE — Progress Notes (Signed)
09/02/2021     CHIEF COMPLAINT Patient presents for  Chief Complaint  Patient presents with   Retina Evaluation      HISTORY OF PRESENT ILLNESS: Kylie Duffy is a 66 y.o. female who presents to the clinic today for:   HPI   7 weeks dilate ou OCT Pt states her vision has been stable Pt denies any new floaters or FOL  Last edited by Morene Rankins, CMA on 09/02/2021  3:26 PM.      Referring physician: Rutherford Guys, [place],  [place] [address]  HISTORICAL INFORMATION:   Selected notes from the [facility]: Current Outpatient Medications (Ophthalmic Drugs)  Medication Sig   ketorolac (ACULAR) 0.5 % ophthalmic solution One drop in operative eye(s) 3 times a day starting 2 days before surgery and continuing 3 weeks after surgery.   No current facility-administered medications for this visit. (Ophthalmic Drugs)   Current Outpatient Medications (Other)  Medication Sig   Calcium Carbonate-Vitamin D (CALCIUM 600 + D PO) Take 3 tablets by mouth daily. Takes 3 chewables daily Calcifood   ibuprofen (ADVIL,MOTRIN) 200 MG tablet Take 400 mg by mouth as needed for headache or moderate pain.   Multiple Vitamin (MULITIVITAMIN WITH MINERALS) TABS Take 1 tablet by mouth daily.   Omega-3 Fatty Acids (FISH OIL) 1200 MG CAPS Take by mouth.   OVER THE COUNTER MEDICATION Take 1 each by mouth daily. C[place]uta   OVER THE COUNTER MEDICATION Take 2 each by mouth daily. Cholaplex   SUMAtriptan (IMITREX) 100 MG tablet Take 1 tablet (100 mg total) by mouth every 2 (two) hours as needed for migraine or headache. May repeat in 2 hours if needed.   Vitamin D, Cholecalciferol, 25 MCG (1000 UT) TABS Take 1,000 Units by mouth 2 (two) times daily.    No current facility-administered medications for this visit. (Other)      REVIEW OF SYSTEMS: ROS   Negative for: Constitutional, Gastrointestinal, Neurological, Skin, Genitourinary,  Musculoskeletal, HENT, Endocrine, Cardiovascular, Eyes, Respiratory, Psychiatric, Allergic/Imm, Heme/Lymph Last edited by Morene Rankins, CMA on 09/02/2021  3:26 PM.       ALLERGIES No Known Allergies  PAST MEDICAL HISTORY 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 might limit the vision to less than 20/20   Osteopenia    PONV (postoperative nausea and vomiting)    Vaginal dryness 12/03/2012   Past Surgical History:  Procedure Laterality Date   ABDOMINAL HYSTERECTOMY     BREAST BIOPSY Left    BREAST EXCISIONAL BIOPSY Right    age 60s benign   CLOSED REDUCTION WRIST FRACTURE Right 02/24/2020   Procedure: CLOSED REDUCTION WRIST with pin placement;  Surgeon: Carole Civil, MD;  Location: [facility];  Service: Orthopedics;  Laterality: Right;   COLONOSCOPY N/A 08/25/2016   Procedure: COLONOSCOPY;  Surgeon: Danie Binder, MD;  Location: [facility];  Service: Endoscopy;  Laterality: N/A;   DIAGNOSTIC LAPAROSCOPY     HARDWARE REMOVAL Right 05/29/2020   Procedure: PIN REMOVAL RIGHT WRIST;  Surgeon: Carole Civil, MD;  Location: [facility];  Service: Orthopedics;  Laterality:  Right;   KNEE ARTHROSCOPY WITH MEDIAL MENISECTOMY  01/26/2012   Procedure: KNEE ARTHROSCOPY WITH MEDIAL MENISECTOMY;  Surgeon: Carole Civil, MD;  Location: [facility];  Service: Orthopedics;  Laterality: Left;   POLYPECTOMY  08/25/2016   Procedure: POLYPECTOMY;  Surgeon: Danie Binder, MD;  Location: [facility];  Service: Endoscopy;;   TONSILLECTOMY      FAMILY HISTORY Family History  Problem Relation Age of Onset   Hypertension Mother    Hyperlipidemia Mother    Thyroid disease Mother    Kidney  disease Mother    Heart disease Father    Hyperlipidemia Father    Cancer Maternal Aunt    Cancer Maternal Uncle    Cancer Paternal Aunt    Cancer Paternal Uncle    Diabetes Maternal Grandmother    Cancer Paternal Grandmother        pancreatic   Hyperlipidemia Brother        borderline   Cancer Paternal Uncle    Alzheimer's disease Paternal Aunt    Colon cancer Neg Hx    Colon polyps Neg Hx    Breast cancer Neg Hx     SOCIAL HISTORY Social History   Tobacco Use   Smoking status: Never   Smokeless tobacco: Never  Vaping Use   Vaping Use: Never used  Substance Use Topics   Alcohol use: Not Currently   Drug use: No         OPHTHALMIC EXAM:  Base Eye Exam     Visual Acuity (ETDRS)       Right Left   Dist  20/30 +3 20/20         Tonometry (Tonopen, 3:32 PM)       Right Left   Pressure 7 8         Pupils       Shape   Right Round   Left          Visual Fields       Left Right    Full Full         Extraocular Movement       Right Left    Ortho Ortho    -- -- --  --  --  -- -- --   -- -- --  --  --  -- -- --           Neuro/Psych     Oriented x3: Yes   Mood/Affect: Normal         Dilation     Both eyes: 1.0% Mydriacyl, 2.5% Phenylephrine @ 3:27 PM           Slit Lamp and Fundus Exam     External Exam       Right Left   External Normal Normal         Slit Lamp Exam       Right Left   Lids/Lashes Normal Normal   Conjunctiva/Sclera White and quiet White and quiet   Cornea Clear Clear   Anterior Chamber Deep and quiet Deep and quiet   Iris Round and reactive Round and reactive   Lens Posterior chamber intraocular lens, Centered posterior chamber intraocular lens Centered posterior chamber intraocular lens   Anterior Vitreous Normal Normal         Fundus Exam       Right Left   Posterior Vitreous Posterior vitreous detachment Clear, avitric   Disc Normal Normal   C/D Ratio 0.3 0.3   Macula  No  macular hole, clinically lamellar thinning region but no full-thickness break negative Watzke sign Macular hole closed.   Vessels Normal Normal   Periphery Normal Normal            IMAGING AND PROCEDURES  Imaging and Procedures for 09/02/21  OCT, Retina - OU - Both Eyes       Right Eye Quality was good. Scan locations included subfoveal. Central Foveal Thickness: 275. Progression has been stable. Findings include abnormal foveal contour.   Left Eye Quality was good. Scan locations included subfoveal. Central Foveal Thickness: 313. Progression has been stable. Findings include abnormal foveal contour, cystoid macular edema, macular hole.   Notes OD incidental PVD, with with apparent foveal operculum on the posterior hyaloid face concomitant with an attenuated foveal region OD Suggest a spontaneous macular hole developing in the right eye in the past with near spontaneous closure.  Break in the outer ELM on OCT is confirmatory of this finding but nonetheless with preserved good acuity except for the cataract, with no break, no full thickness hole OD  OS, macular hole now posed after vitrectomy membrane peel gas injection.  With no intraretinal fluid no CME remaining OS.               ASSESSMENT/PLAN:  Posterior vitreous detachment of right eye  The nature of posterior vitreous detachment was discussed with the patient as well as its physiology, its age prevalence, and its possible implication regarding retinal breaks and detachment.  An informational brochure was offered to the patient.  All the patient's questions were answered.  The patient was asked to return if new or different flashes or floaters develops.   Patient was instructed to contact office immediately if any new changes were noticed. I explained to the patient that vitreous inside the eye is similar to jello inside a bowl. As the jello melts it can start to pull away from the bowl, similarly the vitreous throughout  our lives can begin to pull away from the retina. That process is called a posterior vitreous detachment. In some cases, the vitreous can tug hard enough on the retina to form a retinal tear. I discussed with the patient the signs and symptoms of a retinal detachment.  Do not rub the eye.    Epiretinal membrane, right eye With good acuity will continue to monitor and observe closely OD.  Likely however this is creating a small paracentral scotoma with the patient is noticing.  If any advancement of this condition worsens symptomatically we may in fact need to offer vitrectomy membrane peel as the tangential traction in a centripetal fashion can lead to attenuation of the foveal tissue     ICD-10-CM   1. Posterior vitreous detachment of both eyes  H43.813 OCT, Retina - OU - Both Eyes    2. Posterior vitreous detachment of right eye  H43.811     3. Epiretinal membrane, right eye  H35.371       1.  OD, with epiretinal membrane, tangential traction and small paracentral scotoma.  With overall good acuity not a great candidate at this time for vitrectomy membrane peel however we are seeing tangential traction leading to this type of attenuation and potential visual acuity impact.  We will continue to monitor OD  2.  OS, history of macular hole and surgical repair here.  Continue to monitor as good acuity has been regained  3.  Lengthy discussion about the right eye, without significant visual acuity impact, recommend  to observe.  Patient will continue to monitor at home monocularly.  Ophthalmic Meds Ordered this visit:  No orders of the defined types were placed in this encounter.      Return in about 10 weeks (around 11/11/2021) for dilate, OD, OCT.  There are no Patient Instructions on file for this visit.   Explained the diagnoses, plan, and follow up with the patient and they expressed understanding.  Patient expressed understanding of the importance of proper follow up care.    Clent Demark Bayley Hurn M.D. Diseases & Surgery of the Retina and Vitreous [facility] 09/02/21     Abbreviations: M myopia (nearsighted); A astigmatism; H hyperopia (farsighted); P presbyopia; Mrx spectacle prescription;  CTL contact lenses; OD right eye; OS left eye; OU both eyes  XT exotropia; ET esotropia; PEK punctate epithelial keratitis; PEE punctate epithelial erosions; DES dry eye syndrome; MGD meibomian gland dysfunction; ATs artificial tears; PFAT's preservative free artificial tears; [place] nuclear sclerotic cataract; PSC posterior subcapsular cataract; ERM epi-retinal membrane; PVD posterior vitreous detachment; RD retinal detachment; DM diabetes mellitus; DR diabetic retinopathy; NPDR non-proliferative diabetic retinopathy; PDR proliferative diabetic retinopathy; CSME clinically significant macular edema; DME diabetic macular edema; dbh dot blot hemorrhages; CWS cotton wool spot; POAG primary open angle glaucoma; C/D cup-to-disc ratio; HVF humphrey visual field; GVF goldmann visual field; OCT optical coherence tomography; IOP intraocular pressure; BRVO Branch retinal vein occlusion; CRVO central retinal vein occlusion; CRAO central retinal artery occlusion; BRAO branch retinal artery occlusion; RT retinal tear; SB scleral buckle; PPV pars plana vitrectomy; VH Vitreous hemorrhage; PRP panretinal laser photocoagulation; IVK intravitreal kenalog; VMT vitreomacular traction; MH Macular hole;  NVD neovascularization of the disc; NVE neovascularization elsewhere; AREDS age related eye disease study; ARMD age related macular degeneration; POAG primary open angle glaucoma; EBMD epithelial/anterior basement membrane dystrophy; ACIOL anterior chamber intraocular lens; IOL intraocular lens; PCIOL posterior chamber intraocular lens; Phaco/IOL phacoemulsification with intraocular lens placement; [place] photorefractive keratectomy; LASIK laser assisted in situ keratomileusis; HTN hypertension; DM  diabetes mellitus; COPD chronic obstructive pulmonary disease

## 2021-09-02 NOTE — Assessment & Plan Note (Signed)

## 2021-10-10 DIAGNOSIS — H35371 Puckering of macula, right eye: Secondary | ICD-10-CM | POA: Diagnosis not present

## 2021-10-10 DIAGNOSIS — Z961 Presence of intraocular lens: Secondary | ICD-10-CM | POA: Diagnosis not present

## 2021-11-03 IMAGING — RF DG C-ARM 1-60 MIN
1 series · 6 of 6 positions shown · non-contrast
Comparison: Preoperative radiographs 02/13/2020

CLINICAL DATA: Closed fracture of right wrist, initial encounter.

EXAM:
RIGHT WRIST - 2 VIEW; DG C-ARM 1-60 MIN

[Series 1: run · 6 of 6 slices shown]
[im 1/6]
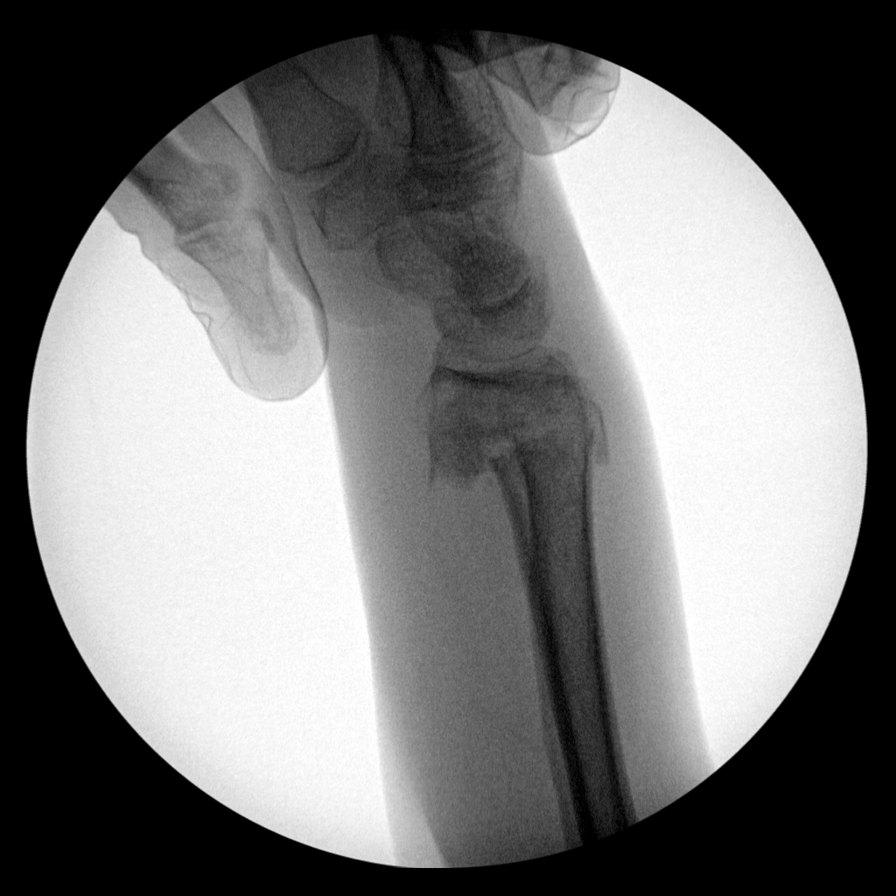
[im 2/6]
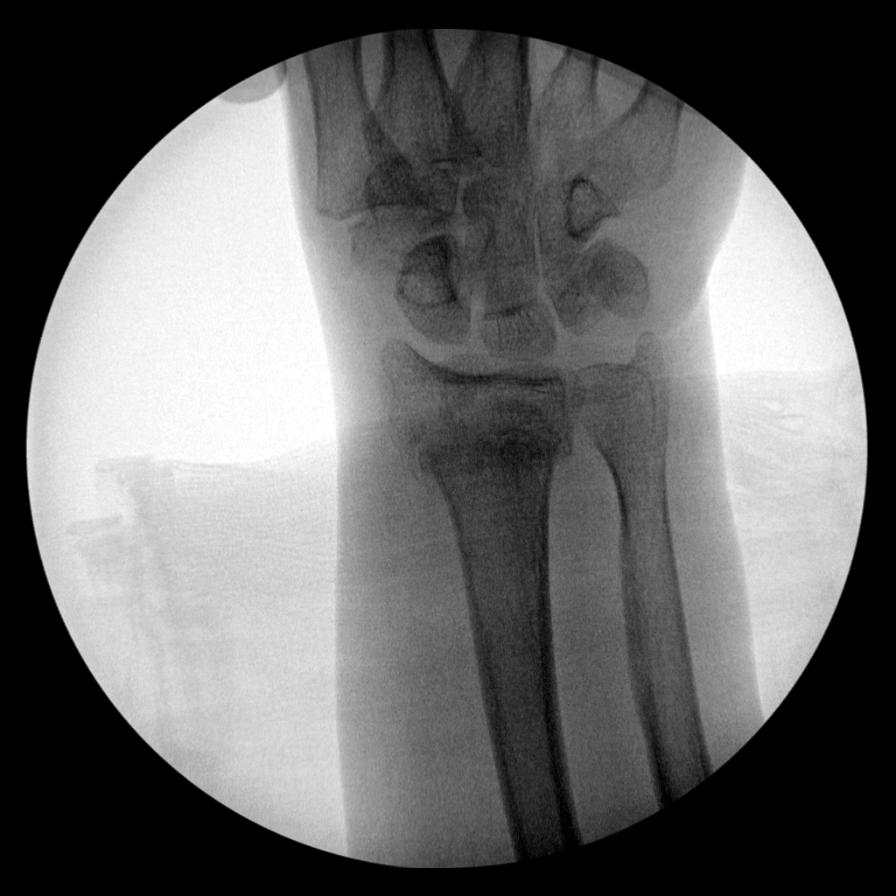
[im 3/6]
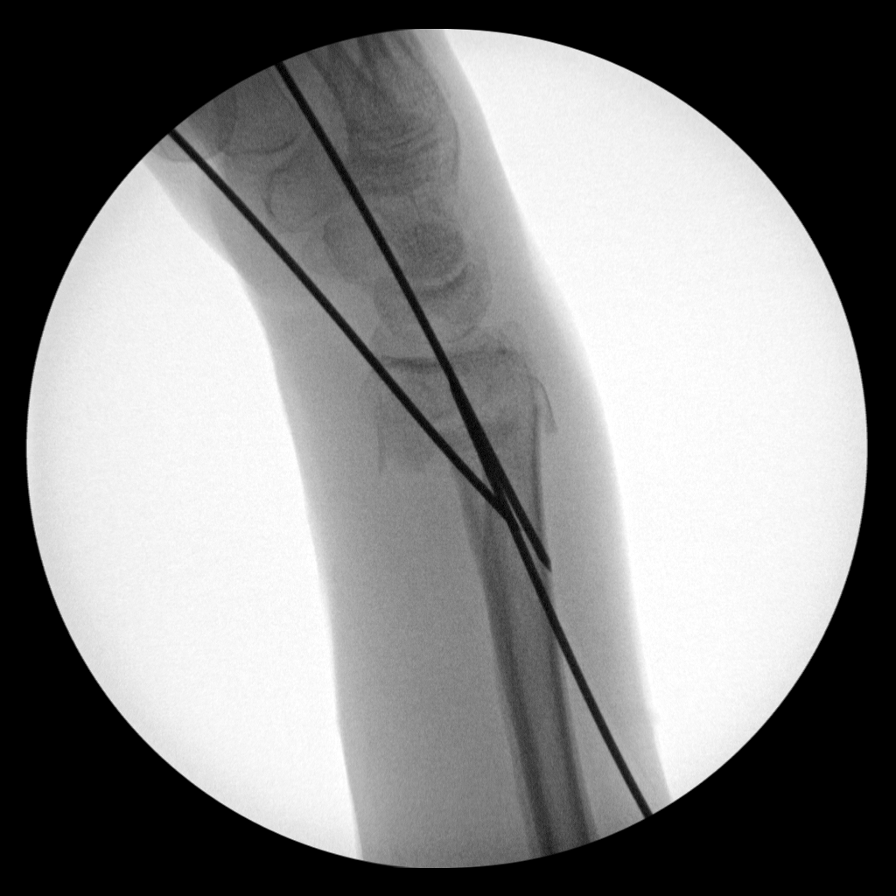
[im 4/6]
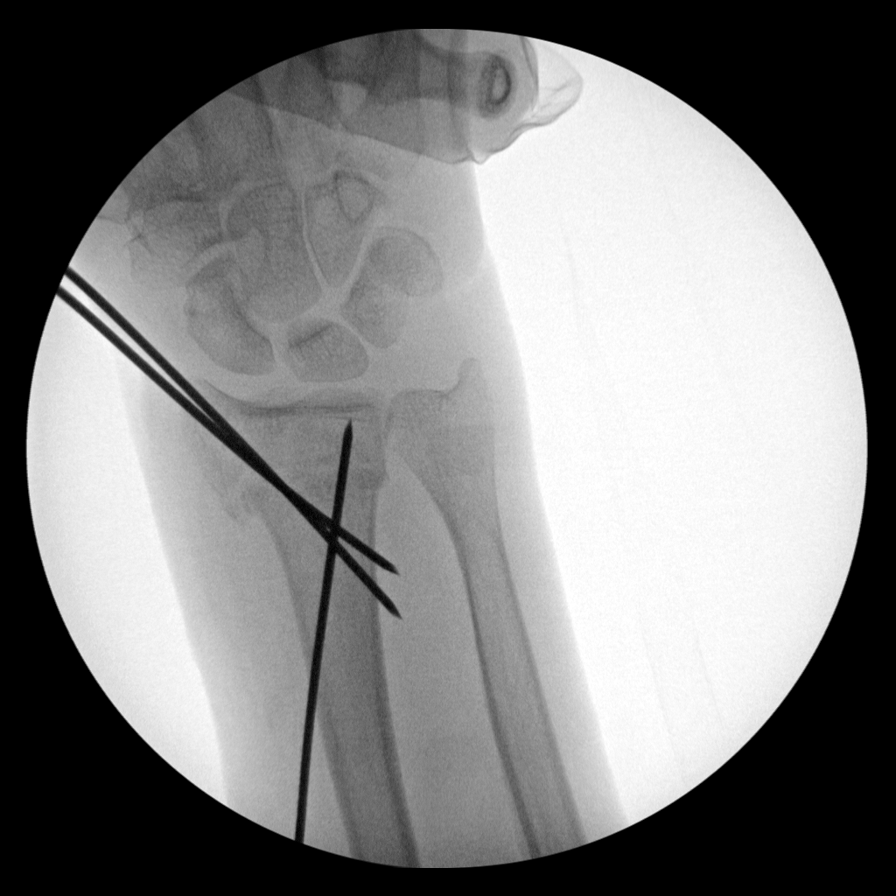
[im 5/6]
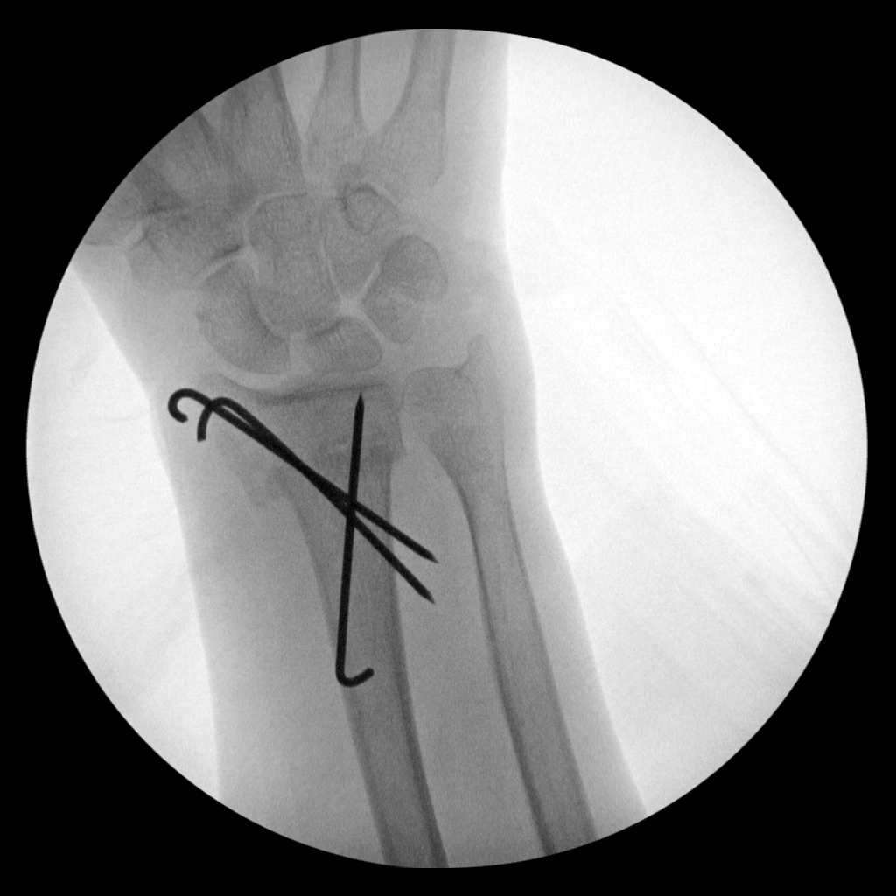
[im 6/6]
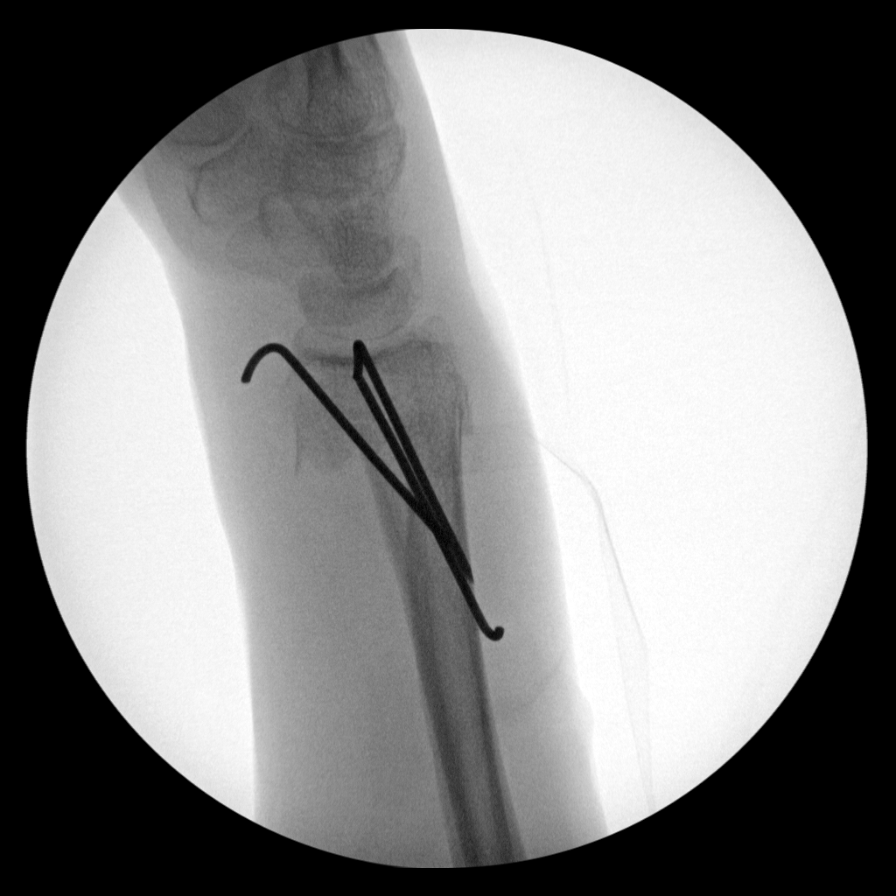

[6 of 6 positions shown; findings below may reference images not displayed]

FINDINGS: Six fluoroscopic spot views obtained in the operating room of the
right wrist in frontal, lateral, and oblique projections. Comminuted
distal radius fracture with placement of 3 percutaneous K-wires.
Total fluoroscopy time 2 minutes 11 seconds. Dose 3.24 mGy.
IMPRESSION: Intraoperative fluoroscopy during percutaneous pinning of comminuted
distal radius fracture.

## 2021-11-11 ENCOUNTER — Encounter (INDEPENDENT_AMBULATORY_CARE_PROVIDER_SITE_OTHER): Payer: 59 | Admitting: Ophthalmology

## 2022-03-27 ENCOUNTER — Encounter: Payer: Self-pay | Admitting: Radiology

## 2022-03-28 ENCOUNTER — Other Ambulatory Visit: Payer: Self-pay

## 2022-03-28 ENCOUNTER — Encounter: Payer: Self-pay | Admitting: Family Medicine

## 2022-03-28 DIAGNOSIS — G43109 Migraine with aura, not intractable, without status migrainosus: Secondary | ICD-10-CM

## 2022-03-28 MED ORDER — SUMATRIPTAN SUCCINATE 100 MG PO TABS: 100.0000 mg | ORAL_TABLET | ORAL | 1 refills | Status: AC | PRN

## 2022-05-01 ENCOUNTER — Other Ambulatory Visit (HOSPITAL_COMMUNITY): Payer: Self-pay | Admitting: Adult Health

## 2022-05-01 DIAGNOSIS — Z1231 Encounter for screening mammogram for malignant neoplasm of breast: Secondary | ICD-10-CM

## 2022-06-04 HISTORY — PX: VITRECTOMY: SHX106

## 2022-06-27 ENCOUNTER — Ambulatory Visit: Payer: Medicare PPO | Admitting: Adult Health

## 2022-06-27 ENCOUNTER — Encounter: Payer: Self-pay | Admitting: Adult Health

## 2022-06-27 VITALS — BP 128/80 | HR 85 | Ht 62.5 in | Wt 133.0 lb

## 2022-06-27 DIAGNOSIS — Z1211 Encounter for screening for malignant neoplasm of colon: Secondary | ICD-10-CM | POA: Diagnosis not present

## 2022-06-27 DIAGNOSIS — Z01419 Encounter for gynecological examination (general) (routine) without abnormal findings: Secondary | ICD-10-CM

## 2022-06-27 DIAGNOSIS — Z90710 Acquired absence of both cervix and uterus: Secondary | ICD-10-CM | POA: Diagnosis not present

## 2022-06-27 LAB — HEMOCCULT GUIAC POC 1CARD (OFFICE): Fecal Occult Blood, POC: NEGATIVE

## 2022-06-27 NOTE — Progress Notes (Signed)
Patient ID: Kylie Duffy, female   DOB: [birth year], 67 y.o.   MRN: [id number] History of Present Illness: Kylie Duffy is a 67 year old white female, married, sp hysterectomy, in for a well woman gyn exam. She has retired and cares for 16 yo mother.  PCP is Dr Tanya Nones.   Current Medications, Allergies, Past Medical History, Past Surgical History, Family History and Social History were reviewed in [name] record.     Review of Systems: Patient denies any headaches, hearing loss, fatigue, blurred vision, shortness of breath, chest pain, abdominal pain, problems with bowel movements, urination, or intercourse. No joint pain or mood swings.     Physical Exam:BP 128/80 (BP Location: Left Arm, Patient Position: Sitting, Cuff Size: Normal)   Pulse 85   Ht 5' 2.5" (1.588 m)   Wt 133 lb (60.3 kg)   BMI 23.94 kg/m   General:  Well developed, well nourished, no acute distress Skin:  Warm and dry Neck:  Midline trachea, normal thyroid, good ROM, no lymphadenopathy. No carotid bruits heard  Lungs; Clear to auscultation bilaterally Breast:  No dominant palpable mass, retraction, or nipple discharge Cardiovascular: Regular rate and rhythm Abdomen:  Soft, non tender, no hepatosplenomegaly Pelvic:  External genitalia is normal in appearance, no lesions.  The vagina is pale with loss of moisture and rugae. Urethra has no lesions or masses. The cervix and uterus are absent.  No adnexal masses or tenderness noted.Bladder is non tender, no masses felt. Rectal: Good sphincter tone, no polyps, or hemorrhoids felt.  Hemoccult negative. Extremities/musculoskeletal:  No swelling, +varicosities noted, no clubbing or cyanosis Psych:  No mood changes, alert and cooperative,seems happy AA is 0 Fall risk is low    06/27/2022    9:57 AM 07/26/2021    2:43 PM 06/04/2021   11:38 AM  Depression screen PHQ 2/9  Decreased Interest 0 0 0  Down, Depressed, Hopeless 0 0 0  PHQ - 2 Score 0 0 0   Altered sleeping 0 0 0  Tired, decreased energy 0 0 0  Change in appetite 0 0 0  Feeling bad or failure about yourself  0 0 0  Trouble concentrating 0 0 0  Moving slowly or fidgety/restless 0 0 0  Suicidal thoughts 0 0 0  PHQ-9 Score 0 0 0  Difficult doing work/chores  Not difficult at all        06/27/2022    9:57 AM 07/26/2021    2:39 PM 06/04/2021   11:39 AM 04/25/2020    1:51 PM  GAD 7 : Generalized Anxiety Score  Nervous, Anxious, on Edge 0 0 0 0  Control/stop worrying 0 0 0 0  Worry too much - different things 0 0 0 0  Trouble relaxing 0 0 0 0  Restless 0 0 0 0  Easily annoyed or irritable 0 0 0 0  Afraid - awful might happen 0 0 0 0  Total GAD 7 Score 0 0 0 0      Upstream - 06/27/22 1005       Pregnancy Intention Screening   Does the patient want to become pregnant in the next year? N/A    Does the patient's partner want to become pregnant in the next year? N/A    Would the patient like to discuss contraceptive options today? N/A      Contraception Wrap Up   Current Method Female Sterilization   hyst   End Method Female Sterilization   hyst  Contraception Counseling Provided No            Examination chaperoned by Malachy Mood LPN   Impression and Plan: 1. Encounter for well woman exam with routine gynecological exam Physical with PCP Labs with PCP Has mammogram scheduled for 07/11/22 Colonoscopy per GI  Stay active and take time for self No more GYN exams needed unless having a problem, follow up prn   2. Encounter for screening fecal occult blood testing Hemoccult was negative  - POCT occult blood stool  3. S/P hysterectomy

## 2022-07-11 ENCOUNTER — Ambulatory Visit (HOSPITAL_COMMUNITY)
Admission: RE | Admit: 2022-07-11 | Discharge: 2022-07-11 | Disposition: A | Discharge status: Home / Self Care | Payer: Medicare PPO | Source: Ambulatory Visit | Attending: Adult Health | Admitting: Adult Health

## 2022-07-11 DIAGNOSIS — Z1231 Encounter for screening mammogram for malignant neoplasm of breast: Secondary | ICD-10-CM | POA: Diagnosis present

## 2022-07-18 ENCOUNTER — Other Ambulatory Visit: Payer: Commercial Managed Care - PPO

## 2022-07-18 DIAGNOSIS — E785 Hyperlipidemia, unspecified: Secondary | ICD-10-CM

## 2022-07-18 DIAGNOSIS — E237 Disorder of pituitary gland, unspecified: Secondary | ICD-10-CM

## 2022-07-18 DIAGNOSIS — D75839 Thrombocytosis, unspecified: Secondary | ICD-10-CM

## 2022-07-20 ENCOUNTER — Ambulatory Visit
Admission: EM | Admit: 2022-07-20 | Discharge: 2022-07-20 | Disposition: A | Discharge status: Home / Self Care | Payer: Medicare PPO | Attending: Nurse Practitioner | Admitting: Nurse Practitioner

## 2022-07-20 DIAGNOSIS — J029 Acute pharyngitis, unspecified: Secondary | ICD-10-CM | POA: Diagnosis present

## 2022-07-20 LAB — POCT RAPID STREP A (OFFICE): Rapid Strep A Screen: NEGATIVE

## 2022-07-20 MED ORDER — LIDOCAINE VISCOUS HCL 2 % MT SOLN: OROMUCOSAL | 0 refills | Status: DC

## 2022-07-20 NOTE — Discharge Instructions (Signed)
The rapid strep test was negative.  A throat culture is pending.  You will be contacted if the pending test result is positive to provide treatment. Take medication as prescribed. Recommend warm salt water gargles 3-4 times daily while symptoms persist. May take over-the-counter ibuprofen or Tylenol for pain or discomfort. Recommend a soft diet to include soup, broth, yogurt, pudding, or Jell-O while symptoms persist. If symptoms have not improved within the next 5 to 7 days, or suddenly worsen before that time, you may follow-up in this clinic or with your primary care physician for further evaluation. Follow-up as needed.

## 2022-07-20 NOTE — ED Triage Notes (Signed)
Pt c/o sore throat, throat started to become sore after a dental cleaning on Tuesday the the next day she felt stuff an ache and an occasional cough

## 2022-07-20 NOTE — ED Provider Notes (Signed)
[facility]    CSN: [id number] Arrival date & time: 07/20/22  7829      History   Chief Complaint No chief complaint on file.   HPI Kylie Duffy is a 67 y.o. female.   The history is provided by the patient.   The patient presents for complaints of sore throat.  Patient states that she had a dental cleaning 5 days ago, subsequently 3 days ago, is when her symptoms started.  She states that her throat feels more sore on the left side.  She also complains of bilateral ear fullness and a mild cough.  She denies fever, chills, nasal drainage, wheezing, shortness of breath, difficulty breathing, chest pain, abdominal pain, nausea, vomiting, or diarrhea.  She reports she has taken over-the-counter cough and cold medications with minimal relief.  She denies any obvious known sick contacts. 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 might limit the vision to less than 20/20   Osteopenia    PONV (postoperative nausea and vomiting)    Vaginal dryness 12/03/2012    Patient Active Problem List   Diagnosis Date Noted   Epiretinal membrane, right eye 09/02/2021   Pseudophakia of right eye 07/15/2021   S/P hysterectomy 06/04/2021   Pseudophakia of left eye 05/06/2021   Macular hole of left eye 04/23/2021   Posterior vitreous detachment of right eye 04/23/2021   Lamellar macular hole of right eye 04/23/2021   Urinary tract infection with hematuria 06/21/2020   Urinary urgency 06/21/2020   Closed fracture distal radius and ulna, right, sequela    Encounter for screening fecal occult blood testing 04/25/2020   Encounter for well woman exam with routine gynecological exam 04/25/2020    Fracture, Colles, right, closed 02/29/2020   Closed fracture of right wrist    Thrombocytosis 07/19/2019   Well woman exam with routine gynecological exam 02/11/2018   Screening for colorectal cancer 02/11/2018   Pituitary abnormality (HCC) 12/26/2016   Hyperlipidemia 06/30/2016   Osteopenia 01/04/2014   Migraines 01/04/2014   Medial meniscus, posterior horn derangement 01/29/2012   Chondromalacia of patella, left 01/29/2012    Past Surgical History:  Procedure Laterality Date   ABDOMINAL HYSTERECTOMY     BREAST BIOPSY Left    BREAST EXCISIONAL BIOPSY Right    age 64s benign   CLOSED REDUCTION WRIST FRACTURE Right 02/24/2020   Procedure: CLOSED REDUCTION WRIST with pin placement;  Surgeon: Vickki Hearing, MD;  Location: [facility];  Service: Orthopedics;  Laterality: Right;   COLONOSCOPY N/A 08/25/2016   Procedure: COLONOSCOPY;  Surgeon: West Bali, MD;  Location: [facility];  Service: Endoscopy;  Laterality: N/A;   DIAGNOSTIC LAPAROSCOPY     HARDWARE REMOVAL Right 05/29/2020   Procedure: PIN REMOVAL RIGHT WRIST;  Surgeon: Vickki Hearing, MD;  Location: [facility];  Service: Orthopedics;  Laterality: Right;   KNEE ARTHROSCOPY WITH MEDIAL MENISECTOMY  01/26/2012   Procedure: KNEE ARTHROSCOPY WITH MEDIAL MENISECTOMY;  Surgeon: Vickki Hearing, MD;  Location: [facility];  Service: Orthopedics;  Laterality: Left;   POLYPECTOMY  08/25/2016   Procedure: POLYPECTOMY;  Surgeon: West Bali, MD;  Location: [facility];  Service: Endoscopy;;  TONSILLECTOMY     VITRECTOMY Right 06/04/2022    OB History     Gravida  2   Para      Term      Preterm      AB  2   Living         SAB  1   IAB      Ectopic  1   Multiple      Live Births               Home Medications    Prior to Admission medications   Medication Sig Start Date End Date Taking? Authorizing Provider  lidocaine (XYLOCAINE) 2 % solution Gargle and spit 5 mL every 6 hours as needed  for throat pain or discomfort. 07/20/22  Yes Willadean Guyton-Warren, Sadie Haber, NP  Calcium Carbonate-Vitamin D (CALCIUM 600 + D PO) Take 3 tablets by mouth daily. Takes 3 chewables daily Calcifood    [provider]  ibuprofen (ADVIL,MOTRIN) 200 MG tablet Take 400 mg by mouth as needed for headache or moderate pain.    [provider]  Multiple Vitamin (MULITIVITAMIN WITH MINERALS) TABS Take 1 tablet by mouth daily.    [provider]  Omega-3 Fatty Acids (FISH OIL) 1200 MG CAPS Take by mouth.    [provider]  OVER THE COUNTER MEDICATION Take 1 each by mouth daily. Cyruta    [provider]  OVER THE COUNTER MEDICATION Take 2 each by mouth daily. Cholaplex    [provider]  SUMAtriptan (IMITREX) 100 MG tablet Take 1 tablet (100 mg total) by mouth every 2 (two) hours as needed for migraine or headache. May repeat in 2 hours if needed. 03/28/22   Donita Brooks, MD  Vitamin D, Cholecalciferol, 25 MCG (1000 UT) TABS Take 1,000 Units by mouth 2 (two) times daily.     [provider]    Family History Family History  Problem Relation Age of Onset   Hypertension Mother    Hyperlipidemia Mother    Thyroid disease Mother    Kidney disease Mother    Heart disease Father    Hyperlipidemia Father    Cancer Maternal Aunt    Cancer Maternal Uncle    Cancer Paternal Aunt    Cancer Paternal Uncle    Diabetes Maternal Grandmother    Cancer Paternal Grandmother        pancreatic   Hyperlipidemia Brother        borderline   Cancer Paternal Uncle    Alzheimer's disease Paternal Aunt    Colon cancer Neg Hx    Colon polyps Neg Hx    Breast cancer Neg Hx     Social History Social History   Tobacco Use   Smoking status: Never   Smokeless tobacco: Never  Vaping Use   Vaping Use: Never used  Substance Use Topics   Alcohol use: Not Currently   Drug use: No     Allergies   Patient has no known allergies.   Review of  Systems Review of Systems Per HPI  Physical Exam Triage Vital Signs ED Triage Vitals  Enc Vitals Group     BP 07/20/22 0828 117/80     Pulse Rate 07/20/22 0828 87     Resp 07/20/22 0828 15     Temp 07/20/22 0828 98.6 F (37 C)     Temp Source 07/20/22 0828 Oral     SpO2 07/20/22 0828 96 %  Weight --      Height --      Head Circumference --      Peak Flow --      Pain Score 07/20/22 0833 7     Pain Loc --      Pain Edu? --      Excl. in GC? --    No data found.  Updated Vital Signs BP 117/80 (BP Location: Right Arm)   Pulse 87   Temp 98.6 F (37 C) (Oral)   Resp 15   SpO2 96%   Visual Acuity Right Eye Distance:   Left Eye Distance:   Bilateral Distance:    Right Eye Near:   Left Eye Near:    Bilateral Near:     Physical Exam Vitals and nursing note reviewed.  Constitutional:      General: She is not in acute distress.    Appearance: Normal appearance.  HENT:     Head: Normocephalic.     Right Ear: Tympanic membrane, ear canal and external ear normal.     Left Ear: Tympanic membrane, ear canal and external ear normal.     Nose: Congestion present.     Right Turbinates: Enlarged and swollen.     Left Turbinates: Enlarged and swollen.     Right Sinus: No maxillary sinus tenderness or frontal sinus tenderness.     Left Sinus: No maxillary sinus tenderness or frontal sinus tenderness.     Mouth/Throat:     Lips: Pink.     Mouth: Mucous membranes are moist.     Pharynx: Uvula midline. Pharyngeal swelling and posterior oropharyngeal erythema present. No oropharyngeal exudate.     Tonsils: 1+ on the right. 1+ on the left.     Comments: Cobblestoning present to posterior oropharynx Eyes:     Extraocular Movements: Extraocular movements intact.     Conjunctiva/sclera: Conjunctivae normal.     Pupils: Pupils are equal, round, and reactive to light.  Cardiovascular:     Rate and Rhythm: Normal rate and regular rhythm.     Pulses: Normal pulses.     Heart  sounds: Normal heart sounds.  Pulmonary:     Effort: Pulmonary effort is normal.     Breath sounds: Normal breath sounds.  Abdominal:     General: Bowel sounds are normal.     Palpations: Abdomen is soft.     Tenderness: There is no abdominal tenderness.  Musculoskeletal:     Cervical back: Normal range of motion.  Skin:    General: Skin is warm and dry.  Neurological:     General: No focal deficit present.     Mental Status: She is alert and oriented to person, place, and time.  Psychiatric:        Mood and Affect: Mood normal.        Behavior: Behavior normal.      UC Treatments / Results  Labs (all labs ordered are listed, but only abnormal results are displayed) Labs Reviewed  CULTURE, GROUP A STREP [facility])  POCT RAPID STREP A (OFFICE)    EKG   Radiology No results found.  Procedures Procedures (including critical care time)  Medications Ordered in UC Medications - No data to display  Initial Impression / Assessment and Plan / UC Course  I have reviewed the triage vital signs and the nursing notes.  Pertinent labs & imaging results that were available during my care of the patient were reviewed by me and considered in my  medical decision making (see chart for details).  The patient is well-appearing, she she is in no acute distress, vital signs are stable.  Rapid strep test was negative, throat culture is pending.  Patient was provided symptomatic treatment with viscous lidocaine 2% to gargle and spit for throat pain or discomfort.  Supportive care recommendations were provided and discussed with the patient to include warm salt water gargles, over-the-counter ibuprofen or Tylenol, and a soft diet.  Patient was advised she will be contacted if the pending test result is positive to provide treatment.  Patient was also given follow-up precautions.  Patient was in agreement with this plan of care and verbalized understanding.  All questions were answered.  Patient  stable for discharge.   Final Clinical Impressions(s) / UC Diagnoses   Final diagnoses:  Acute pharyngitis, unspecified etiology     Discharge Instructions      The rapid strep test was negative.  A throat culture is pending.  You will be contacted if the pending test result is positive to provide treatment. Take medication as prescribed. Recommend warm salt water gargles 3-4 times daily while symptoms persist. May take over-the-counter ibuprofen or Tylenol for pain or discomfort. Recommend a soft diet to include soup, broth, yogurt, pudding, or Jell-O while symptoms persist. If symptoms have not improved within the next 5 to 7 days, or suddenly worsen before that time, you may follow-up in this clinic or with your primary care physician for further evaluation. Follow-up as needed.     ED Prescriptions     Medication Sig Dispense Auth. Provider   lidocaine (XYLOCAINE) 2 % solution Gargle and spit 5 mL every 6 hours as needed for throat pain or discomfort. 100 mL Ryiah Bellissimo-Warren, Sadie Haber, NP      PDMP not reviewed this encounter.   Abran Cantor, NP 07/20/22 [phone]

## 2022-07-22 LAB — CULTURE, GROUP A STREP (THRC)

## 2022-07-24 ENCOUNTER — Ambulatory Visit
Admission: EM | Admit: 2022-07-24 | Discharge: 2022-07-24 | Disposition: A | Discharge status: Home / Self Care | Payer: Medicare PPO | Attending: Emergency Medicine | Admitting: Emergency Medicine

## 2022-07-24 ENCOUNTER — Other Ambulatory Visit: Payer: Commercial Managed Care - PPO

## 2022-07-24 DIAGNOSIS — J029 Acute pharyngitis, unspecified: Secondary | ICD-10-CM

## 2022-07-24 MED ORDER — IBUPROFEN 600 MG PO TABS: 600.0000 mg | ORAL_TABLET | Freq: Three times a day (TID) | ORAL | 0 refills | Status: AC | PRN

## 2022-07-24 NOTE — Discharge Instructions (Signed)
This appears to be a viral pharyngitis.  1 gram of Tylenol and 600 mg ibuprofen together 3-4 times a day as needed for pain.  Make sure you drink plenty of extra fluids.  Some people find salt water gargles and  Traditional Medic[employer]'s "Throat Coat" tea helpful. Take 5 mL of liquid Benadryl and 5 mL of Maalox. Mix it together, and then hold it in your mouth for as long as you can and then swallow. You may do this 4 times a day.  Honey and lemon dissolved in hot water can also be soothing.  Go to www.goodrx.com  or www.costplusdrugs.com to look up your medications. This will give you a list of where you can find your prescriptions at the most affordable prices. Or ask the pharmacist what the cash price is, or if they have any other discount programs available to help make your medication more affordable. This can be less expensive than what you would pay with insurance.

## 2022-07-24 NOTE — ED Triage Notes (Signed)
Pt c/o sore throat, hoarseness, headache body ache, low grade fever, chills pt was seen here on 07/20/2022 was diagnosed with acute pharyngitis and was prescribed lidocaine to help with sore throat. Sx's have persisted

## 2022-07-24 NOTE — ED Provider Notes (Signed)
HPI  SUBJECTIVE: Patient presents with 8 days of sore throat, left ear pain/sensation of feeling clogged, headache, occasional cough, raspy voice and worsening fatigue.  She reports 1 episode of emesis 5 days ago, none since.  No fevers above 100.4, body aches, nasal congestion, rhinorrhea, postnasal drip, sinus pain or pressure, wheezing, shortness of breath, nausea, vomiting, diarrhea, abdominal pain, or rash.  No muffled voice, drooling, trismus, sensation of throat swelling shut, difficulty breathing, neck stiffness.  No allergy or GERD symptoms.  No antibiotics in the past month.  She took Tylenol within 6 hours of evaluation.    Patient was seen here 5 days ago for this.  Rapid strep negative.  Sent home with viscous lidocaine, Tylenol/ibuprofen.  Strep culture negative.  States the viscous lidocaine, Tylenol and ibuprofen helped temporarily.  Symptoms are worse with p.o. intake.  She is status post tonsillectomy.  [facility] .she has follow-up with her PCP on Monday  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 might limit the vision to less than 20/20   Osteopenia    PONV (postoperative nausea and vomiting)    Vaginal dryness 12/03/2012    Past Surgical History:  Procedure Laterality Date   ABDOMINAL HYSTERECTOMY     BREAST BIOPSY Left    BREAST EXCISIONAL BIOPSY Right    age 31s benign   CLOSED REDUCTION WRIST FRACTURE Right 02/24/2020   Procedure: CLOSED REDUCTION WRIST with pin placement;  Surgeon: Vickki Hearing, MD;  Location: [facility];  Service: Orthopedics;  Laterality: Right;   COLONOSCOPY N/A 08/25/2016   Procedure: COLONOSCOPY;  Surgeon: West Bali, MD;  [facility];  Service: Endoscopy;  Laterality: N/A;   DIAGNOSTIC LAPAROSCOPY     HARDWARE REMOVAL Right 05/29/2020   Procedure: PIN REMOVAL RIGHT WRIST;  Surgeon: Vickki Hearing, MD;  Location: [facility];  Service: Orthopedics;  Laterality: Right;   KNEE ARTHROSCOPY WITH MEDIAL MENISECTOMY  01/26/2012   Procedure: KNEE ARTHROSCOPY WITH MEDIAL MENISECTOMY;  Surgeon: Vickki Hearing, MD;  Location: [facility];  Service: Orthopedics;  Laterality: Left;   POLYPECTOMY  08/25/2016   Procedure: POLYPECTOMY;  Surgeon: West Bali, MD;  [facility];  Service: Endoscopy;;   TONSILLECTOMY     VITRECTOMY Right 06/04/2022    Family History  Problem Relation Age of Onset   Hypertension Mother    Hyperlipidemia Mother    Thyroid disease Mother    Kidney disease Mother    Heart disease Father    Hyperlipidemia Father    Cancer Maternal Aunt    Cancer Maternal Uncle    Cancer Paternal Aunt    Cancer Paternal Uncle    Diabetes Maternal Grandmother    Cancer Paternal Grandmother        pancreatic   Hyperlipidemia Brother        borderline   Cancer Paternal Uncle    Alzheimer's disease Paternal Aunt    Colon cancer Neg Hx    Colon polyps Neg Hx    Breast cancer Neg Hx     Social History   Tobacco Use   Smoking status: Never   Smokeless tobacco: Never  Vaping Use   Vaping Use: Never used  Substance Use Topics   Alcohol use: Not Currently   Drug use: No    No current facility-administered medications for this encounter.  Current Outpatient Medications:    ibuprofen (ADVIL) 600 MG tablet, Take 1 tablet (600 mg total) by mouth every 8 (eight) hours as needed., Disp: 30 tablet, Rfl: 0   Calcium Carbonate-Vitamin D (CALCIUM 600 + D PO), Take 3 tablets by mouth daily. Takes 3 chewables daily Calcifood, Disp: , Rfl:    lidocaine (XYLOCAINE) 2 % solution, Gargle and spit 5 mL every 6 hours as needed for throat pain or discomfort., Disp: 100 mL, Rfl: 0    Multiple Vitamin (MULITIVITAMIN WITH MINERALS) TABS, Take 1 tablet by mouth daily., Disp: , Rfl:    Omega-3 Fatty Acids (FISH OIL) 1200 MG CAPS, Take by mouth., Disp: , Rfl:    OVER THE COUNTER MEDICATION, Take 1 each by mouth daily. Cyruta, Disp: , Rfl:    OVER THE COUNTER MEDICATION, Take 2 each by mouth daily. Cholaplex, Disp: , Rfl:    SUMAtriptan (IMITREX) 100 MG tablet, Take 1 tablet (100 mg total) by mouth every 2 (two) hours as needed for migraine or headache. May repeat in 2 hours if needed., Disp: 10 tablet, Rfl: 1   Vitamin D, Cholecalciferol, 25 MCG (1000 UT) TABS, Take 1,000 Units by mouth 2 (two) times daily. , Disp: , Rfl:   No Known Allergies   ROS  As noted in HPI.   Physical Exam  BP 118/79 (BP Location: Right Arm)   Pulse 94   Temp 98.4 F (36.9 C) (Oral)   Resp 15   SpO2 95%   Constitutional: Well developed, well nourished, no acute distress Eyes:  EOMI, conjunctiva normal bilaterally HENT: Normocephalic, atraumatic,mucus membranes moist.  No nasal congestion.  Normal turbinates.  No maxillary, frontal sinus tenderness.  Left TM slightly erythematous, but not dull or bulging.  No air-fluid levels behind the TM.  Throat intensely erythematous with multiple extensive ulcers on the left tonsillar pillar.  Tonsils surgically absent.  No drooling, trismus. Neck: Positive left-sided cervical lymphadenopathy Respiratory: Normal inspiratory effort Cardiovascular: Normal rate, no murmurs, rubs, gallops GI: nondistended, nontender.  skin: No rash, skin intact Musculoskeletal: no deformities Neurologic: Alert & oriented x 3, no focal neuro deficits Psychiatric: Speech and behavior appropriate.  ED Course   Medications - No data to display  No orders of the defined types were placed in this encounter.   No results found for this or any previous visit (from the past 24 hour(s)). No results found.  ED Clinical Impression  1. Ulcerative pharyngitis      ED  Assessment/Plan    Previous records reviewed.  As noted in HPI  Strep culture was negative.  Presentation consistent with a viral pharyngitis.  No evidence of deep space infection.  Supportive treatment with Benadryl/Maalox mixture, Tylenol/ibuprofen, time.  Follow-up with PCP as arranged on Monday.   Meds ordered this encounter  Medications   ibuprofen (ADVIL) 600 MG tablet    Sig: Take 1 tablet (600 mg total) by mouth every 8 (eight) hours as needed.    Dispense:  30 tablet    Refill:  0     *This clinic note was created using Scientist, clinical (histocompatibility and immunogenetics). Therefore, there may be occasional mistakes despite careful proofreading.     Domenick Gong, MD 07/24/22 1113

## 2022-07-28 ENCOUNTER — Ambulatory Visit (INDEPENDENT_AMBULATORY_CARE_PROVIDER_SITE_OTHER): Payer: Medicare PPO | Admitting: Family Medicine

## 2022-07-28 ENCOUNTER — Encounter: Payer: Self-pay | Admitting: Family Medicine

## 2022-07-28 VITALS — BP 118/70 | HR 91 | Temp 98.6°F | Ht 62.0 in | Wt 128.6 lb

## 2022-07-28 DIAGNOSIS — Z90710 Acquired absence of both cervix and uterus: Secondary | ICD-10-CM | POA: Diagnosis not present

## 2022-07-28 DIAGNOSIS — E785 Hyperlipidemia, unspecified: Secondary | ICD-10-CM | POA: Diagnosis not present

## 2022-07-28 DIAGNOSIS — Z0001 Encounter for general adult medical examination with abnormal findings: Secondary | ICD-10-CM | POA: Diagnosis not present

## 2022-07-28 DIAGNOSIS — M8589 Other specified disorders of bone density and structure, multiple sites: Secondary | ICD-10-CM | POA: Diagnosis not present

## 2022-07-28 DIAGNOSIS — Z0000 Encounter for general adult medical examination without abnormal findings: Secondary | ICD-10-CM

## 2022-07-28 LAB — COMPLETE METABOLIC PANEL WITH GFR
Creat: 0.74 mg/dL (ref 0.50–1.05)
Globulin: 2.7 g/dL (calc) (ref 1.9–3.7)
Sodium: 138 mmol/L (ref 135–146)
Total Bilirubin: 0.4 mg/dL (ref 0.2–1.2)

## 2022-07-28 LAB — LIPID PANEL
Non-HDL Cholesterol (Calc): 166 mg/dL (calc) — ABNORMAL HIGH (ref <130)
Triglycerides: 221 mg/dL — ABNORMAL HIGH (ref <150)

## 2022-07-28 LAB — CBC WITH DIFFERENTIAL/PLATELET
Eosinophils Absolute: 92 cells/uL (ref 15–500)
Hemoglobin: 13.2 g/dL (ref 11.7–15.5)

## 2022-07-28 NOTE — Progress Notes (Signed)
Subjective:    Patient ID: Kylie Duffy, female    DOB: 09-04-1955, 67 y.o.   MRN: [id number]  HPI Patient is a very pleasant 67 year old Caucasian female here today for complete physical exam.  Mammogram was normal 07/11/22.  Patient's bone density test was 2 years ago.  Her T-score was -3.4.  We have decided to repeat that now.  She is taking calcium and vitamin D.  Her colonoscopy was in 2019 and had 1 adenoma.  We have decided to repeat that in 7 years.  Therefore that will be due in 2025.  She is due for the shingles vaccine.  She is also due for Pneumovax 23.  She was recently sick with what sounds like herpangina or CMV.  Therefore she would like to defer the vaccines until she feels better.  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 might limit the vision to less than 20/20   Osteopenia    PONV (postoperative nausea and vomiting)    Vaginal dryness 12/03/2012   Past Surgical History:  Procedure Laterality Date   ABDOMINAL HYSTERECTOMY     BREAST BIOPSY Left    BREAST EXCISIONAL BIOPSY Right    age 39s benign   CLOSED REDUCTION WRIST FRACTURE Right 02/24/2020   Procedure: CLOSED REDUCTION WRIST with pin placement;  Surgeon: Vickki Hearing, MD;  Location: [facility];  Service: Orthopedics;  Laterality: Right;   COLONOSCOPY N/A 08/25/2016   Procedure: COLONOSCOPY;  Surgeon: West Bali, MD;  Location: [facility];  Service: Endoscopy;  Laterality: N/A;   DIAGNOSTIC LAPAROSCOPY     HARDWARE REMOVAL Right 05/29/2020   Procedure: PIN REMOVAL RIGHT WRIST;  Surgeon: Vickki Hearing, MD;  Location: [facility];  Service: Orthopedics;  Laterality: Right;   KNEE ARTHROSCOPY WITH MEDIAL  MENISECTOMY  01/26/2012   Procedure: KNEE ARTHROSCOPY WITH MEDIAL MENISECTOMY;  Surgeon: Vickki Hearing, MD;  Location: [facility];  Service: Orthopedics;  Laterality: Left;   POLYPECTOMY  08/25/2016   Procedure: POLYPECTOMY;  Surgeon: West Bali, MD;  Location: [facility];  Service: Endoscopy;;   TONSILLECTOMY     VITRECTOMY Right 06/04/2022   Current Outpatient Medications on File Prior to Visit  Medication Sig Dispense Refill   Calcium Carbonate-Vitamin D (CALCIUM 600 + D PO) Take 3 tablets by mouth daily. Takes 3 chewables daily Calcifood     ibuprofen (ADVIL) 600 MG tablet Take 1 tablet (600 mg total) by mouth every 8 (eight) hours as needed. 30 tablet 0   Multiple Vitamin (MULITIVITAMIN WITH MINERALS) TABS Take 1 tablet by mouth daily.     Omega-3 Fatty Acids (FISH OIL) 1200 MG CAPS Take by mouth.     OVER THE COUNTER MEDICATION Take 1 each by mouth daily. Cyruta     OVER THE COUNTER MEDICATION Take 2 each by mouth daily. Cholaplex     OVER THE COUNTER MEDICATION Tylenol extra strength 1 tablet     SUMAtriptan (IMITREX) 100 MG tablet Take 1 tablet (100 mg total) by mouth every 2 (two) hours as needed for migraine or headache. May repeat in 2 hours if needed. 10 tablet 1   Vitamin D, Cholecalciferol,  25 MCG (1000 UT) TABS Take 1,000 Units by mouth 2 (two) times daily.      lidocaine (XYLOCAINE) 2 % solution Gargle and spit 5 mL every 6 hours as needed for throat pain or discomfort. (Patient not taking: Reported on 07/28/2022) 100 mL 0   No current facility-administered medications on file prior to visit.   No Known Allergies Social History   Socioeconomic History   Marital status: Married    Spouse name: Not on file   Number of children: Not on file   Years of education: Not on file   Highest education level: Not on file  Occupational History   Not on file  Tobacco Use   Smoking status: Never   Smokeless tobacco: Never  Vaping Use   Vaping Use: Never used  Substance  and Sexual Activity   Alcohol use: Not Currently   Drug use: No   Sexual activity: Yes    Birth control/protection: Surgical, Post-menopausal    Comment: hyst  Other Topics Concern   Not on file  Social History Narrative   Not on file   Social Determinants of Health   Financial Resource Strain: Low Risk  (06/27/2022)   Overall Financial Resource Strain (CARDIA)    Difficulty of Paying Living Expenses: Not hard at all  Food Insecurity: No Food Insecurity (06/27/2022)   Hunger Vital Sign    Worried About Running Out of Food in the Last Year: Never true    Ran Out of Food in the Last Year: Never true  Transportation Needs: No Transportation Needs (06/27/2022)   PRAPARE - Administrator, Civil Service (Medical): No    Lack of Transportation (Non-Medical): No  Physical Activity: Insufficiently Active (06/27/2022)   Exercise Vital Sign    Days of Exercise per Week: 5 days    Minutes of Exercise per Session: 20 min  Stress: No Stress Concern Present (06/27/2022)   [employer] of Occupational Health - Occupational Stress Questionnaire    Feeling of Stress : Not at all  Social Connections: Moderately Integrated (06/27/2022)   Social Connection and Isolation Panel [NHANES]    Frequency of Communication with Friends and Family: Three times a week    Frequency of Social Gatherings with Friends and Family: Once a week    Attends Religious Services: More than 4 times per year    Active Member of [employer] or Organizations: No    Attends Banker Meetings: Never    Marital Status: Married  Catering manager Violence: Not At Risk (06/27/2022)   Humiliation, Afraid, Rape, and Kick questionnaire    Fear of Current or Ex-Partner: No    Emotionally Abused: No    Physically Abused: No    Sexually Abused: No   Family History  Problem Relation Age of Onset   Hypertension Mother    Hyperlipidemia Mother    Thyroid disease Mother    Kidney disease Mother    Heart disease  Father    Hyperlipidemia Father    Cancer Maternal Aunt    Cancer Maternal Uncle    Cancer Paternal Aunt    Cancer Paternal Uncle    Diabetes Maternal Grandmother    Cancer Paternal Grandmother        pancreatic   Hyperlipidemia Brother        borderline   Cancer Paternal Uncle    Alzheimer's disease Paternal Aunt    Colon cancer Neg Hx    Colon polyps Neg Hx  Breast cancer Neg Hx       Review of Systems  All other systems reviewed and are negative.      Objective:   Physical Exam Vitals reviewed.  Constitutional:      General: She is not in acute distress.    Appearance: Normal appearance. She is normal weight. She is not ill-appearing, toxic-appearing or diaphoretic.  HENT:     Head: Normocephalic and atraumatic.     Right Ear: Tympanic membrane, ear canal and external ear normal. There is no impacted cerumen.     Left Ear: Tympanic membrane, ear canal and external ear normal. There is no impacted cerumen.     Nose: Nose normal. No congestion or rhinorrhea.     Mouth/Throat:     Mouth: Mucous membranes are moist.     Pharynx: Oropharynx is clear. No oropharyngeal exudate or posterior oropharyngeal erythema.  Eyes:     Extraocular Movements: Extraocular movements intact.     Conjunctiva/sclera: Conjunctivae normal.     Pupils: Pupils are equal, round, and reactive to light.  Neck:     Vascular: No carotid bruit.  Cardiovascular:     Rate and Rhythm: Normal rate and regular rhythm.     Pulses: Normal pulses.     Heart sounds: Normal heart sounds. No murmur heard.    No friction rub. No gallop.  Pulmonary:     Effort: Pulmonary effort is normal. No respiratory distress.     Breath sounds: Normal breath sounds. No stridor. No wheezing, rhonchi or rales.  Abdominal:     General: Abdomen is flat. Bowel sounds are normal. There is no distension.     Palpations: Abdomen is soft. There is no mass.     Tenderness: There is no abdominal tenderness. There is no  guarding or rebound.  Musculoskeletal:        General: No swelling or tenderness. Normal range of motion.     Cervical back: Normal range of motion. No rigidity or tenderness.     Right lower leg: No edema.     Left lower leg: No edema.  Lymphadenopathy:     Cervical: No cervical adenopathy.  Skin:    General: Skin is warm.     Coloration: Skin is not jaundiced or pale.     Findings: No bruising, erythema, lesion or rash.  Neurological:     General: No focal deficit present.     Mental Status: She is alert and oriented to person, place, and time. Mental status is at baseline.     Cranial Nerves: No cranial nerve deficit.     Sensory: No sensory deficit.     Motor: No weakness.     Coordination: Coordination normal.     Gait: Gait normal.     Deep Tendon Reflexes: Reflexes normal.  Psychiatric:        Mood and Affect: Mood normal.        Behavior: Behavior normal.        Thought Content: Thought content normal.        Judgment: Judgment normal.          Assessment & Plan:  Routine general medical examination at a health care facility - Plan: CBC with Differential/Platelet, COMPLETE METABOLIC PANEL WITH GFR, Lipid panel, VITAMIN D 25 Hydroxy (Vit-D Deficiency, Fractures), TSH  S/P hysterectomy  Hyperlipidemia, unspecified hyperlipidemia type - Plan: CBC with Differential/Platelet, COMPLETE METABOLIC PANEL WITH GFR, Lipid panel, TSH  Osteopenia of multiple sites - Plan: VITAMIN D  25 Hydroxy (Vit-D Deficiency, Fractures), TSH, DG Bone Density Patient's physical exam today is completely normal.  I will check a CBC a CMP and a lipid panel given her history of hyperlipidemia.  Given her history of osteopenia I would like to check a vitamin D level and also a TSH.  We will schedule the patient for a bone density test.  Patient will continue to take calcium and vitamin D.  Her blood pressure today is excellent.  I recommended Shingrix and also recommended Pneumovax 23.  She defers  these at the present time.

## 2022-07-29 LAB — COMPLETE METABOLIC PANEL WITHOUT GFR
AG Ratio: 1.6 (calc) (ref 1.0–2.5)
ALT: 16 U/L (ref 6–29)
Albumin: 4.4 g/dL (ref 3.6–5.1)
Alkaline phosphatase (APISO): 56 U/L (ref 37–153)
BUN: 13 mg/dL (ref 7–25)
CO2: 29 mmol/L (ref 20–32)
Glucose, Bld: 94 mg/dL (ref 65–99)
Potassium: 4.4 mmol/L (ref 3.5–5.3)
Total Protein: 7.1 g/dL (ref 6.1–8.1)
eGFR: 89 mL/min/{1.73_m2} (ref >=60)

## 2022-07-29 LAB — CBC WITH DIFFERENTIAL/PLATELET
Absolute Monocytes: 462 {cells}/uL (ref 200–950)
Basophils Absolute: 79 cells/uL (ref 0–200)
Basophils Relative: 1.2 %
Eosinophils Relative: 1.4 %
HCT: 40.3 % (ref 35.0–45.0)
Lymphs Abs: 990 cells/uL (ref 850–3900)
MCH: 29.5 pg (ref 27.0–33.0)
MCHC: 32.8 g/dL (ref 32.0–36.0)
MCV: 90 fL (ref 80.0–100.0)
MPV: 10 fL (ref 7.5–12.5)
Monocytes Relative: 7 %
Neutro Abs: 4976 {cells}/uL (ref 1500–7800)
Neutrophils Relative %: 75.4 %
Platelets: 592 10*3/uL — ABNORMAL HIGH (ref 140–400)
RBC: 4.48 10*6/uL (ref 3.80–5.10)
RDW: 13.6 % (ref 11.0–15.0)
Total Lymphocyte: 15 %
WBC: 6.6 10*3/uL (ref 3.8–10.8)

## 2022-07-29 LAB — COMPLETE METABOLIC PANEL WITH GFR
AST: 19 U/L (ref 10–35)
Calcium: 9.2 mg/dL (ref 8.6–10.4)
Chloride: 103 mmol/L (ref 98–110)

## 2022-07-29 LAB — LIPID PANEL
Cholesterol: 205 mg/dL — ABNORMAL HIGH (ref <200)
HDL: 39 mg/dL — ABNORMAL LOW (ref >=50)
LDL Cholesterol (Calc): 130 mg/dL — ABNORMAL HIGH
Total CHOL/HDL Ratio: 5.3 (calc) — ABNORMAL HIGH (ref <5.0)

## 2022-07-29 LAB — VITAMIN D 25 HYDROXY (VIT D DEFICIENCY, FRACTURES): Vit D, 25-Hydroxy: 42 ng/mL (ref 30–100)

## 2022-07-29 LAB — TSH: TSH: 1.19 mIU/L (ref 0.40–4.50)

## 2022-08-01 ENCOUNTER — Telehealth: Payer: Self-pay | Admitting: Family Medicine

## 2022-08-01 NOTE — Telephone Encounter (Signed)
Patient states she was seen on Monday. She states that she has been sick for about two weeks now. She states that since Monday's appointment she has developed a cough which is no big deal. However she would like to know if she can have a singles antiviral test done. She states that she wants to make sure that she isn't starting to come down with shingles.  CB# [phone]

## 2022-08-08 ENCOUNTER — Encounter: Payer: Self-pay | Admitting: Family Medicine

## 2022-08-08 ENCOUNTER — Ambulatory Visit (INDEPENDENT_AMBULATORY_CARE_PROVIDER_SITE_OTHER): Payer: Medicare PPO | Admitting: Family Medicine

## 2022-08-08 VITALS — BP 112/62 | HR 103 | Temp 98.9°F | Ht 62.0 in | Wt 122.4 lb

## 2022-08-08 DIAGNOSIS — G510 Bell's palsy: Secondary | ICD-10-CM | POA: Diagnosis not present

## 2022-08-08 MED ORDER — VALACYCLOVIR HCL 1 G PO TABS
1000.0000 mg | ORAL_TABLET | Freq: Three times a day (TID) | ORAL | 0 refills | Status: DC
Start: 2022-08-08 — End: 2022-08-29

## 2022-08-08 MED ORDER — PREDNISONE 20 MG PO TABS
60.0000 mg | ORAL_TABLET | Freq: Every day | ORAL | 0 refills | Status: DC
Start: 2022-08-08 — End: 2022-08-29

## 2022-08-08 NOTE — Progress Notes (Signed)
Subjective:    Patient ID: Kylie Duffy, female    DOB: [birth year], 67 y.o.   MRN: [id number]  Headache    Patient recently had herpangina/viral pharyngitis and a viral syndrome.  She states that over the last few days she has had no appetite.  She reports having a dull headache on the left side of her face.  She also reports an inability to be able to purse her lips.  She states that the left side of her face just does not feel normal.  She also reports a change in her taste/dysgeusia.     While it is subtle, there is definitely asymmetry on the left side of her face.  See the photographs above.  Her left eye is involved.  She is unable to close the eye tightly.  I am easily able to open the eye against resistance.  Also there is slight asymmetry when she tries to smile checking her left cheek muscles.  There is no decrease sensation in the face.  There is no other neurologic deficit anywhere in her body. 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 might limit the vision to less than 20/20   Osteopenia    PONV (postoperative nausea and vomiting)    Vaginal dryness 12/03/2012   Past Surgical History:  Procedure Laterality Date   ABDOMINAL HYSTERECTOMY     BREAST BIOPSY Left    BREAST EXCISIONAL BIOPSY Right    age 39s benign   CLOSED REDUCTION WRIST FRACTURE Right 02/24/2020   Procedure: CLOSED REDUCTION WRIST with pin placement;  Surgeon: Vickki Hearing, MD;  Location: [facility];  Service: Orthopedics;  Laterality: Right;   COLONOSCOPY N/A 08/25/2016   Procedure: COLONOSCOPY;  Surgeon: West Bali, MD;  Location: [facility];  Service: Endoscopy;  Laterality: N/A;   DIAGNOSTIC  LAPAROSCOPY     HARDWARE REMOVAL Right 05/29/2020   Procedure: PIN REMOVAL RIGHT WRIST;  Surgeon: Vickki Hearing, MD;  Location: [facility];  Service: Orthopedics;  Laterality: Right;   KNEE ARTHROSCOPY WITH MEDIAL MENISECTOMY  01/26/2012   Procedure: KNEE ARTHROSCOPY WITH MEDIAL MENISECTOMY;  Surgeon: Vickki Hearing, MD;  Location: [facility];  Service: Orthopedics;  Laterality: Left;   POLYPECTOMY  08/25/2016   Procedure: POLYPECTOMY;  Surgeon: West Bali, MD;  Location: [facility];  Service: Endoscopy;;   TONSILLECTOMY     VITRECTOMY Right 06/04/2022   Current Outpatient Medications on File Prior to Visit  Medication Sig Dispense Refill   Calcium Carbonate-Vitamin D (CALCIUM 600 + D PO) Take 3 tablets by mouth daily. Takes 3 chewables daily Calcifood     ibuprofen (ADVIL) 600 MG tablet Take 1 tablet (600 mg total) by mouth every 8 (eight) hours as needed. 30 tablet 0   Multiple Vitamin (MULITIVITAMIN WITH MINERALS) TABS Take 1 tablet by mouth daily.     Omega-3 Fatty Acids (FISH OIL) 1200 MG CAPS Take by mouth.     OVER THE COUNTER MEDICATION Take 1 each by mouth daily. Cyruta     OVER THE COUNTER MEDICATION Take 2 each by mouth daily. Cholaplex     OVER THE COUNTER MEDICATION Tylenol extra strength  1 tablet     SUMAtriptan (IMITREX) 100 MG tablet Take 1 tablet (100 mg total) by mouth every 2 (two) hours as needed for migraine or headache. May repeat in 2 hours if needed. 10 tablet 1   Vitamin D, Cholecalciferol, 25 MCG (1000 UT) TABS Take 1,000 Units by mouth 2 (two) times daily.      lidocaine (XYLOCAINE) 2 % solution Gargle and spit 5 mL every 6 hours as needed for throat pain or discomfort. (Patient not taking: Reported on 08/08/2022) 100 mL 0   No current facility-administered medications on file prior to visit.   No Known Allergies Social History   Socioeconomic History   Marital status: Married    Spouse name: Not on file   Number of children: Not on file   Years of  education: Not on file   Highest education level: Not on file  Occupational History   Not on file  Tobacco Use   Smoking status: Never   Smokeless tobacco: Never  Vaping Use   Vaping status: Never Used  Substance and Sexual Activity   Alcohol use: Not Currently   Drug use: No   Sexual activity: Yes    Birth control/protection: Surgical, Post-menopausal    Comment: hyst  Other Topics Concern   Not on file  Social History Narrative   Not on file   Social Determinants of Health   Financial Resource Strain: Low Risk  (06/27/2022)   Overall Financial Resource Strain (CARDIA)    Difficulty of Paying Living Expenses: Not hard at all  Food Insecurity: No Food Insecurity (06/27/2022)   Hunger Vital Sign    Worried About Running Out of Food in the Last Year: Never true    Ran Out of Food in the Last Year: Never true  Transportation Needs: No Transportation Needs (06/27/2022)   PRAPARE - Administrator, Civil Service (Medical): No    Lack of Transportation (Non-Medical): No  Physical Activity: Insufficiently Active (06/27/2022)   Exercise Vital Sign    Days of Exercise per Week: 5 days    Minutes of Exercise per Session: 20 min  Stress: No Stress Concern Present (06/27/2022)   [employer] of Occupational Health - Occupational Stress Questionnaire    Feeling of Stress : Not at all  Social Connections: Moderately Integrated (06/27/2022)   Social Connection and Isolation Panel [NHANES]    Frequency of Communication with Friends and Family: Three times a week    Frequency of Social Gatherings with Friends and Family: Once a week    Attends Religious Services: More than 4 times per year    Active Member of [employer] or Organizations: No    Attends Banker Meetings: Never    Marital Status: Married  Catering manager Violence: Not At Risk (06/27/2022)   Humiliation, Afraid, Rape, and Kick questionnaire    Fear of Current or Ex-Partner: No    Emotionally Abused: No     Physically Abused: No    Sexually Abused: No   Family History  Problem Relation Age of Onset   Hypertension Mother    Hyperlipidemia Mother    Thyroid disease Mother    Kidney disease Mother    Heart disease Father    Hyperlipidemia Father    Cancer Maternal Aunt    Cancer Maternal Uncle    Cancer Paternal Aunt    Cancer Paternal Uncle    Diabetes Maternal Grandmother    Cancer Paternal Grandmother  pancreatic   Hyperlipidemia Brother        borderline   Cancer Paternal Uncle    Alzheimer's disease Paternal Aunt    Colon cancer Neg Hx    Colon polyps Neg Hx    Breast cancer Neg Hx       Review of Systems  Neurological:  Positive for headaches.  All other systems reviewed and are negative.      Objective:   Physical Exam Vitals reviewed.  Constitutional:      General: She is not in acute distress.    Appearance: Normal appearance. She is normal weight. She is not ill-appearing, toxic-appearing or diaphoretic.  HENT:     Head: Normocephalic and atraumatic.     Right Ear: Tympanic membrane, ear canal and external ear normal. There is no impacted cerumen.     Left Ear: Tympanic membrane, ear canal and external ear normal. There is no impacted cerumen.     Nose: Nose normal. No congestion or rhinorrhea.     Mouth/Throat:     Mouth: Mucous membranes are moist.     Pharynx: Oropharynx is clear. No oropharyngeal exudate or posterior oropharyngeal erythema.  Eyes:     General: No visual field deficit.    Extraocular Movements: Extraocular movements intact.     Conjunctiva/sclera: Conjunctivae normal.     Pupils: Pupils are equal, round, and reactive to light.  Neck:     Vascular: No carotid bruit.  Cardiovascular:     Rate and Rhythm: Normal rate and regular rhythm.     Pulses: Normal pulses.     Heart sounds: Normal heart sounds. No murmur heard.    No friction rub. No gallop.  Pulmonary:     Effort: Pulmonary effort is normal. No respiratory distress.      Breath sounds: Normal breath sounds. No stridor. No wheezing, rhonchi or rales.  Abdominal:     General: Abdomen is flat. Bowel sounds are normal. There is no distension.     Palpations: Abdomen is soft. There is no mass.     Tenderness: There is no abdominal tenderness. There is no guarding or rebound.  Musculoskeletal:        General: No swelling or tenderness. Normal range of motion.     Cervical back: Normal range of motion. No rigidity or tenderness.     Right lower leg: No edema.     Left lower leg: No edema.  Lymphadenopathy:     Cervical: No cervical adenopathy.  Skin:    General: Skin is warm.     Coloration: Skin is not jaundiced or pale.     Findings: No bruising, erythema, lesion or rash.  Neurological:     General: No focal deficit present.     Mental Status: She is alert and oriented to person, place, and time. Mental status is at baseline.     Cranial Nerves: Cranial nerve deficit and facial asymmetry present. No dysarthria.     Sensory: No sensory deficit.     Motor: No weakness.     Coordination: Coordination normal.     Gait: Gait normal.     Deep Tendon Reflexes: Reflexes normal.  Psychiatric:        Mood and Affect: Mood normal.        Behavior: Behavior normal.        Thought Content: Thought content normal.        Judgment: Judgment normal.          Assessment &  Plan:  Bell's palsy Patient's exam today suggest an underlying isolated left 7th cranial nerve palsy consistent with Bell's palsy.  She also recently had a herpetiform virus.  This would be consistent.  Begin Valtrex 1 g p.o. 3 times daily for 7 days with prednisone 60 mg daily for 7 days.  Recheck immediately if symptoms worsen or change.  Physical exam does not suggest stroke

## 2022-08-11 ENCOUNTER — Ambulatory Visit: Payer: Medicare PPO | Admitting: Family Medicine

## 2022-08-12 ENCOUNTER — Ambulatory Visit: Payer: Medicare PPO | Admitting: Family Medicine

## 2022-08-29 ENCOUNTER — Ambulatory Visit (HOSPITAL_COMMUNITY)
Admission: RE | Admit: 2022-08-29 | Discharge: 2022-08-29 | Disposition: A | Discharge status: Home / Self Care | Payer: Medicare PPO | Source: Ambulatory Visit | Attending: Family Medicine | Admitting: Family Medicine

## 2022-08-29 ENCOUNTER — Encounter: Payer: Self-pay | Admitting: Family Medicine

## 2022-08-29 ENCOUNTER — Ambulatory Visit (INDEPENDENT_AMBULATORY_CARE_PROVIDER_SITE_OTHER): Payer: Medicare PPO | Admitting: Family Medicine

## 2022-08-29 VITALS — BP 112/62 | HR 89 | Temp 98.5°F | Ht 62.0 in | Wt 124.0 lb

## 2022-08-29 DIAGNOSIS — R052 Subacute cough: Secondary | ICD-10-CM

## 2022-08-29 MED ORDER — HYDROCOD POLI-CHLORPHE POLI ER 10-8 MG/5ML PO SUER: 5.0000 mL | Freq: Two times a day (BID) | ORAL | 0 refills | Status: DC | PRN

## 2022-08-29 NOTE — Progress Notes (Signed)
Subjective:    Patient ID: Kylie Duffy, female    DOB: [birth year], 67 y.o.   MRN: [id number] Thankfully the Bell's palsy has resolved.  However the patient states that she has had a cough now for 5 weeks.  She denies fevers or chills.  She denies purulent sputum.  She denies hemoptysis patient has had pleurisy.  She denies any wheezing.  However the cough "is driving her crazy".  She tells sometimes feels like there may be spasms in her airway.  She denies any improvement on prednisone.  She denies any acid reflux.  She denies any postnasal drip or drainage. 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 might limit the vision to less than 20/20   Osteopenia    PONV (postoperative nausea and vomiting)    Vaginal dryness 12/03/2012   Past Surgical History:  Procedure Laterality Date   ABDOMINAL HYSTERECTOMY     BREAST BIOPSY Left    BREAST EXCISIONAL BIOPSY Right    age 63s benign   CLOSED REDUCTION WRIST FRACTURE Right 02/24/2020   Procedure: CLOSED REDUCTION WRIST with pin placement;  Surgeon: Vickki Hearing, MD;  Location: [facility];  Service: Orthopedics;  Laterality: Right;   COLONOSCOPY N/A 08/25/2016   Procedure: COLONOSCOPY;  Surgeon: West Bali, MD;  [facility]: [facility];  Service: Endoscopy;  Laterality: N/A;   DIAGNOSTIC LAPAROSCOPY     HARDWARE REMOVAL Right 05/29/2020   Procedure: PIN REMOVAL RIGHT WRIST;  Surgeon: Vickki Hearing, MD;  Location: [facility];  Service: Orthopedics;  Laterality: Right;   KNEE ARTHROSCOPY WITH MEDIAL MENISECTOMY  01/26/2012   Procedure: KNEE ARTHROSCOPY WITH MEDIAL MENISECTOMY;  Surgeon: Vickki Hearing, MD;  Location: [facility];  Service:  Orthopedics;  Laterality: Left;   POLYPECTOMY  08/25/2016   Procedure: POLYPECTOMY;  Surgeon: West Bali, MD;  [facility]: [facility];  Service: Endoscopy;;   TONSILLECTOMY     VITRECTOMY Right 06/04/2022   Current Outpatient Medications on File Prior to Visit  Medication Sig Dispense Refill   Calcium Carbonate-Vitamin D (CALCIUM 600 + D PO) Take 3 tablets by mouth daily. Takes 3 chewables daily Calcifood     ibuprofen (ADVIL) 600 MG tablet Take 1 tablet (600 mg total) by mouth every 8 (eight) hours as needed. 30 tablet 0   Multiple Vitamin (MULITIVITAMIN WITH MINERALS) TABS Take 1 tablet by mouth daily.     Omega-3 Fatty Acids (FISH OIL) 1200 MG CAPS Take by mouth.     OVER THE COUNTER MEDICATION Take 1 each by mouth daily. Cyruta     OVER THE COUNTER MEDICATION Take 2 each by mouth daily. Cholaplex     SUMAtriptan (IMITREX) 100 MG tablet Take 1 tablet (100 mg total) by mouth every 2 (two) hours as needed for migraine or headache. May repeat in 2 hours if needed. 10 tablet 1   Vitamin D, Cholecalciferol, 25 MCG (1000 UT) TABS Take 1,000 Units by mouth 2 (two) times daily.      No current facility-administered medications on file prior to visit.   No Known Allergies Social History   Socioeconomic History   Marital status: Married  Spouse name: Not on file   Number of children: Not on file   Years of education: Not on file   Highest education level: Not on file  Occupational History   Not on file  Tobacco Use   Smoking status: Never   Smokeless tobacco: Never  Vaping Use   Vaping status: Never Used  Substance and Sexual Activity   Alcohol use: Not Currently   Drug use: No   Sexual activity: Yes    Birth control/protection: Surgical, Post-menopausal    Comment: hyst  Other Topics Concern   Not on file  Social History Narrative   Not on file   Social Determinants of Health   Financial Resource Strain: Low Risk  (06/27/2022)   Overall Financial Resource Strain  (CARDIA)    Difficulty of Paying Living Expenses: Not hard at all  Food Insecurity: No Food Insecurity (06/27/2022)   Hunger Vital Sign    Worried About Running Out of Food in the Last Year: Never true    Ran Out of Food in the Last Year: Never true  Transportation Needs: No Transportation Needs (06/27/2022)   PRAPARE - Administrator, Civil Service (Medical): No    Lack of Transportation (Non-Medical): No  Physical Activity: Insufficiently Active (06/27/2022)   Exercise Vital Sign    Days of Exercise per Week: 5 days    Minutes of Exercise per Session: 20 min  Stress: No Stress Concern Present (06/27/2022)   [employer] of Occupational Health - Occupational Stress Questionnaire    Feeling of Stress : Not at all  Social Connections: Moderately Integrated (06/27/2022)   Social Connection and Isolation Panel [NHANES]    Frequency of Communication with Friends and Family: Three times a week    Frequency of Social Gatherings with Friends and Family: Once a week    Attends Religious Services: More than 4 times per year    Active Member of [employer] or Organizations: No    Attends Banker Meetings: Never    Marital Status: Married  Catering manager Violence: Not At Risk (06/27/2022)   Humiliation, Afraid, Rape, and Kick questionnaire    Fear of Current or Ex-Partner: No    Emotionally Abused: No    Physically Abused: No    Sexually Abused: No   Family History  Problem Relation Age of Onset   Hypertension Mother    Hyperlipidemia Mother    Thyroid disease Mother    Kidney disease Mother    Heart disease Father    Hyperlipidemia Father    Cancer Maternal Aunt    Cancer Maternal Uncle    Cancer Paternal Aunt    Cancer Paternal Uncle    Diabetes Maternal Grandmother    Cancer Paternal Grandmother        pancreatic   Hyperlipidemia Brother        borderline   Cancer Paternal Uncle    Alzheimer's disease Paternal Aunt    Colon cancer Neg Hx    Colon polyps  Neg Hx    Breast cancer Neg Hx       Review of Systems  Respiratory:  Positive for cough.   Neurological:  Positive for headaches.  All other systems reviewed and are negative.      Objective:   Physical Exam Vitals reviewed.  Constitutional:      General: She is not in acute distress.    Appearance: Normal appearance. She is normal weight. She is not ill-appearing, toxic-appearing or diaphoretic.  HENT:     Head: Normocephalic and atraumatic.     Right Ear: Tympanic membrane, ear canal and external ear normal. There is no impacted cerumen.     Left Ear: Tympanic membrane, ear canal and external ear normal. There is no impacted cerumen.     Nose: Nose normal. No congestion or rhinorrhea.     Mouth/Throat:     Mouth: Mucous membranes are moist.     Pharynx: Oropharynx is clear. No oropharyngeal exudate or posterior oropharyngeal erythema.  Eyes:     General: No visual field deficit.    Extraocular Movements: Extraocular movements intact.     Conjunctiva/sclera: Conjunctivae normal.     Pupils: Pupils are equal, round, and reactive to light.  Neck:     Vascular: No carotid bruit.  Cardiovascular:     Rate and Rhythm: Normal rate and regular rhythm.     Pulses: Normal pulses.     Heart sounds: Normal heart sounds. No murmur heard.    No friction rub. No gallop.  Pulmonary:     Effort: Pulmonary effort is normal. No respiratory distress.     Breath sounds: Normal breath sounds. No stridor. No wheezing, rhonchi or rales.  Abdominal:     General: Abdomen is flat. Bowel sounds are normal. There is no distension.     Palpations: Abdomen is soft. There is no mass.     Tenderness: There is no abdominal tenderness. There is no guarding or rebound.  Musculoskeletal:        General: No swelling or tenderness. Normal range of motion.     Cervical back: Normal range of motion. No rigidity or tenderness.     Right lower leg: No edema.     Left lower leg: No edema.  Lymphadenopathy:      Cervical: No cervical adenopathy.  Skin:    General: Skin is warm.     Coloration: Skin is not jaundiced or pale.     Findings: No bruising, erythema, lesion or rash.  Neurological:     General: No focal deficit present.     Mental Status: She is alert and oriented to person, place, and time. Mental status is at baseline.     Cranial Nerves: Facial asymmetry present. No cranial nerve deficit or dysarthria.     Sensory: No sensory deficit.     Motor: No weakness.     Coordination: Coordination normal.     Gait: Gait normal.     Deep Tendon Reflexes: Reflexes normal.  Psychiatric:        Mood and Affect: Mood normal.        Behavior: Behavior normal.        Thought Content: Thought content normal.        Judgment: Judgment normal.          Assessment & Plan:  Subacute cough - Plan: DG Chest 2 View Bell's palsy has almost completely resolved.  Suspect upper airway cough syndrome.  I recommended the neck and taste every 2 hours for 1 week to break the cycle of coughing.  Proceed with a chest x-ray.

## 2022-09-09 ENCOUNTER — Telehealth: Payer: Self-pay

## 2022-09-09 ENCOUNTER — Other Ambulatory Visit: Payer: Self-pay

## 2022-09-09 NOTE — Telephone Encounter (Signed)
Pt saw CXR results on My chart and has a question regarding this part of the impression: The lungs are symmetrically hyperinflated in keeping with changes of underlying COPD. Pt states she has never smoked but has had hx of bronchitis in past. Pt asks if this is something she should be concerned about? Thank you!

## 2022-11-03 DIAGNOSIS — D225 Melanocytic nevi of trunk: Secondary | ICD-10-CM | POA: Diagnosis not present

## 2022-11-03 DIAGNOSIS — D2272 Melanocytic nevi of left lower limb, including hip: Secondary | ICD-10-CM | POA: Diagnosis not present

## 2022-11-03 DIAGNOSIS — Z1283 Encounter for screening for malignant neoplasm of skin: Secondary | ICD-10-CM | POA: Diagnosis not present

## 2022-11-03 DIAGNOSIS — D485 Neoplasm of uncertain behavior of skin: Secondary | ICD-10-CM | POA: Diagnosis not present

## 2022-12-04 ENCOUNTER — Ambulatory Visit: Payer: Medicare PPO

## 2022-12-04 VITALS — Ht 62.0 in | Wt 124.0 lb

## 2022-12-04 DIAGNOSIS — Z0000 Encounter for general adult medical examination without abnormal findings: Secondary | ICD-10-CM

## 2022-12-04 NOTE — Progress Notes (Signed)
Subjective:   Kylie Duffy is a 67 y.o. female who presents for an Initial Medicare Annual Wellness Visit.  Visit Complete: Virtual I connected with  Doristine Section on 12/04/22 by a audio enabled telemedicine application and verified that I am speaking with the correct person using two identifiers.  Patient Location: Home  Provider Location: Home Office  I discussed the limitations of evaluation and management by telemedicine. The patient expressed understanding and agreed to proceed.  Vital Signs: Because this visit was a virtual/telehealth visit, some criteria may be missing or patient reported. Any vitals not documented were not able to be obtained and vitals that have been documented are patient reported.  Cardiac Risk Factors include: advanced age (>38men, >36 women)     Objective:    Today's Vitals   12/04/22 0900  Weight: 124 lb (56.2 kg)  Height: 5\' 2"  (1.575 m)   Body mass index is 22.68 kg/m.     12/04/2022    9:31 AM 08/07/2020    9:13 AM 05/28/2020    9:55 AM 04/24/2020    5:23 PM 02/24/2020   11:13 AM 02/16/2020   10:49 AM 02/13/2020    9:07 PM  Advanced Directives  Does Patient Have a Medical Advance Directive? No No No No No No No  Would patient like information on creating a medical advance directive? Yes (MAU/Ambulatory/Procedural Areas - Information given)  No - Patient declined No - Patient declined No - Patient declined No - Patient declined No - Patient declined    Current Medications (verified) Outpatient Encounter Medications as of 12/04/2022  Medication Sig   Calcium Carbonate-Vitamin D (CALCIUM 600 + D PO) Take 3 tablets by mouth daily. Takes 3 chewables daily Calcifood   ibuprofen (ADVIL) 600 MG tablet Take 1 tablet (600 mg total) by mouth every 8 (eight) hours as needed.   Multiple Vitamin (MULITIVITAMIN WITH MINERALS) TABS Take 1 tablet by mouth daily.   Omega-3 Fatty Acids (FISH OIL) 1200 MG CAPS Take by mouth.   OVER THE COUNTER MEDICATION Take  1 each by mouth daily. Cyruta   OVER THE COUNTER MEDICATION Take 2 each by mouth daily. Cholaplex   SUMAtriptan (IMITREX) 100 MG tablet Take 1 tablet (100 mg total) by mouth every 2 (two) hours as needed for migraine or headache. May repeat in 2 hours if needed.   Vitamin D, Cholecalciferol, 25 MCG (1000 UT) TABS Take 1,000 Units by mouth 2 (two) times daily.    No facility-administered encounter medications on file as of 12/04/2022.    Allergies (verified) Patient has no known allergies.   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 might limit the vision to less than 20/20   Osteopenia    PONV (postoperative nausea and vomiting)    Vaginal dryness 12/03/2012   Past Surgical History:  Procedure Laterality Date   ABDOMINAL HYSTERECTOMY     BREAST BIOPSY Left    BREAST EXCISIONAL BIOPSY Right    age 49s benign   CLOSED REDUCTION WRIST FRACTURE Right 02/24/2020   Procedure: CLOSED REDUCTION WRIST with pin placement;  Surgeon: Vickki Hearing, MD;  Location: [facility];  Service: Orthopedics;  Laterality: Right;   COLONOSCOPY N/A 08/25/2016   Procedure: COLONOSCOPY;  Surgeon: West Bali, MD;  [facility];  Service: Endoscopy;  Laterality: N/A;   DIAGNOSTIC LAPAROSCOPY     HARDWARE REMOVAL Right 05/29/2020   Procedure: PIN REMOVAL RIGHT WRIST;  Surgeon: Vickki Hearing, MD;  Location: [facility];  Service: Orthopedics;  Laterality: Right;   KNEE ARTHROSCOPY WITH MEDIAL MENISECTOMY  01/26/2012   Procedure: KNEE ARTHROSCOPY WITH MEDIAL MENISECTOMY;  Surgeon: Vickki Hearing, MD;  Location: [facility];  Service: Orthopedics;  Laterality: Left;   POLYPECTOMY  08/25/2016    Procedure: POLYPECTOMY;  Surgeon: West Bali, MD;  [facility];  Service: Endoscopy;;   TONSILLECTOMY     VITRECTOMY Right 06/04/2022   Family History  Problem Relation Age of Onset   Hypertension Mother    Hyperlipidemia Mother    Thyroid disease Mother    Kidney disease Mother    Heart disease Father    Hyperlipidemia Father    Cancer Maternal Aunt    Cancer Maternal Uncle    Cancer Paternal Aunt    Cancer Paternal Uncle    Diabetes Maternal Grandmother    Cancer Paternal Grandmother        pancreatic   Hyperlipidemia Brother        borderline   Cancer Paternal Uncle    Alzheimer's disease Paternal Aunt    Colon cancer Neg Hx    Colon polyps Neg Hx    Breast cancer Neg Hx    Social History   Socioeconomic History   Marital status: Married    Spouse name: Not on file   Number of children: Not on file   Years of education: Not on file   Highest education level: Not on file  Occupational History   Not on file  Tobacco Use   Smoking status: Never   Smokeless tobacco: Never  Vaping Use   Vaping status: Never Used  Substance and Sexual Activity   Alcohol use: Not Currently   Drug use: No   Sexual activity: Yes    Birth control/protection: Surgical, Post-menopausal    Comment: hyst  Other Topics Concern   Not on file  Social History Narrative   Not on file   Social Determinants of Health   Financial Resource Strain: Low Risk  (12/04/2022)   Overall Financial Resource Strain (CARDIA)    Difficulty of Paying Living Expenses: Not hard at all  Food Insecurity: No Food Insecurity (12/04/2022)   Hunger Vital Sign    Worried About Running Out of Food in the Last Year: Never true    Ran Out of Food in the Last Year: Never true  Transportation Needs: No Transportation Needs (12/04/2022)   PRAPARE - Administrator, Civil Service (Medical): No    Lack of Transportation (Non-Medical): No  Physical Activity: Sufficiently Active (12/04/2022)    Exercise Vital Sign    Days of Exercise per Week: 5 days    Minutes of Exercise per Session: 30 min  Stress: No Stress Concern Present (12/04/2022)   [employer] of Occupational Health - Occupational Stress Questionnaire    Feeling of Stress : Not at all  Social Connections: Moderately Integrated (12/04/2022)   Social Connection and Isolation Panel [NHANES]    Frequency of Communication with Friends and Family: More than three times a week    Frequency of Social Gatherings with Friends and Family: Once a week    Attends Religious Services:  More than 4 times per year    Active Member of Clubs or Organizations: No    Attends Banker Meetings: Never    Marital Status: Married    Tobacco Counseling Counseling given: Not Answered   Clinical Intake:  Pre-visit preparation completed: Yes  Pain : No/denies pain     Diabetes: No  How often do you need to have someone help you when you read instructions, pamphlets, or other written materials from your doctor or pharmacy?: 1 - Never  Interpreter Needed?: No  Information entered by :: Kandis Fantasia LPN   Activities of Daily Living    12/04/2022    9:31 AM  In your present state of health, do you have any difficulty performing the following activities:  Hearing? 0  Vision? 0  Difficulty concentrating or making decisions? 0  Walking or climbing stairs? 0  Dressing or bathing? 0  Doing errands, shopping? 0  Preparing Food and eating ? N  Using the Toilet? N  In the past six months, have you accidently leaked urine? N  Do you have problems with loss of bowel control? N  Managing your Medications? N  Managing your Finances? N  Housekeeping or managing your Housekeeping? N    Patient Care Team: Donita Brooks, MD as PCP - General (Family Medicine)  Indicate any recent Medical Services you may have received from other than [facility] providers in the past year (date may be approximate).     Assessment:    This is a routine wellness examination for Breeze.  Hearing/Vision screen Hearing Screening - Comments:: Denies hearing difficulties   Vision Screening - Comments:: Wears rx glasses - up to date with routine eye exams with Dr. Dione Booze     Goals Addressed             This Visit's Progress    Remain active and independent        Depression Screen    12/04/2022    9:28 AM 08/08/2022   10:36 AM 07/28/2022    9:34 AM 06/27/2022    9:57 AM 07/26/2021    2:43 PM 06/04/2021   11:38 AM 07/20/2020   10:27 AM  PHQ 2/9 Scores  PHQ - 2 Score 0 0 0 0 0 0 0  PHQ- 9 Score 0 0 0 0 0 0     Fall Risk    12/04/2022    9:31 AM 08/08/2022   10:36 AM 07/28/2022    9:34 AM 06/27/2022   10:05 AM 07/26/2021    2:39 PM  Fall Risk   Falls in the past year? 0 0 0 0 0  Number falls in past yr: 0 0 0 0 0  Injury with Fall? 0 0 0 0 0  Risk for fall due to : No Fall Risks No Fall Risks     Follow up Falls prevention discussed;Education provided;Falls evaluation completed Falls prevention discussed       MEDICARE RISK AT HOME: Medicare Risk at Home Any stairs in or around the home?: No If so, are there any without handrails?: No Home free of loose throw rugs in walkways, pet beds, electrical cords, etc?: Yes Adequate lighting in your home to reduce risk of falls?: Yes Life alert?: No Use of a cane, walker or w/c?: No Grab bars in the bathroom?: Yes Shower chair or bench in shower?: No Elevated toilet seat or a handicapped toilet?: Yes  TIMED UP AND GO:  Was the test performed? No  Cognitive Function:        12/04/2022    9:31 AM  6CIT Screen  What Year? 0 points  What month? 0 points  What time? 0 points  Count back from 20 0 points  Months in reverse 0 points  Repeat phrase 0 points  Total Score 0 points    Immunizations Immunization History  Administered Date(s) Administered   Influenza,inj,Quad PF,6+ Mos 10/31/2016   Influenza-Unspecified 11/02/2013, 11/03/2014, 11/02/2015    Moderna Sars-Covid-2 Vaccination 10/06/2019, 11/03/2019   Tdap 04/24/2006, 07/13/2018    TDAP status: Up to date  Flu Vaccine status: Up to date  Pneumococcal vaccine status: Due, Education has been provided regarding the importance of this vaccine. Advised may receive this vaccine at local pharmacy or Health Dept. Aware to provide a copy of the vaccination record if obtained from local pharmacy or Health Dept. Verbalized acceptance and understanding.  Covid-19 vaccine status: Information provided on how to obtain vaccines.   Qualifies for Shingles Vaccine? Yes   Zostavax completed No   Shingrix Completed?: No.    Education has been provided regarding the importance of this vaccine. Patient has been advised to call insurance company to determine out of pocket expense if they have not yet received this vaccine. Advised may also receive vaccine at local pharmacy or Health Dept. Verbalized acceptance and understanding.  Screening Tests Health Maintenance  Topic Date Due   Zoster Vaccines- Shingrix (1 of 2) Never done   COVID-19 Vaccine (3 - 2023-24 season) 09/28/2022   Pneumonia Vaccine 74+ Years old (1 of 1 - PCV) 08/08/2023 (Originally 05/24/2021)   Medicare Annual Wellness (AWV)  12/04/2023   MAMMOGRAM  07/10/2024   Colonoscopy  08/26/2027   DTaP/Tdap/Td (3 - Td or Tdap) 07/12/2028   DEXA SCAN  Completed   Hepatitis C Screening  Completed   HPV VACCINES  Aged Out    Health Maintenance  Health Maintenance Due  Topic Date Due   Zoster Vaccines- Shingrix (1 of 2) Never done   COVID-19 Vaccine (3 - 2023-24 season) 09/28/2022    Colorectal cancer screening: Type of screening: Colonoscopy. Completed 08/25/17. Repeat every 10 years  Mammogram status: Completed 07/11/22. Repeat every year  Bone Density status: Ordered 07/28/22. Pt provided with contact info and advised to call to schedule appt.  Lung Cancer Screening: (Low Dose CT Chest recommended if Age 73-80 years, 20 pack-year  currently smoking OR have quit w/in 15years.) does not qualify.   Lung Cancer Screening Referral: n/a  Additional Screening:  Hepatitis C Screening: does qualify; Completed 05/07/15  Vision Screening: Recommended annual ophthalmology exams for early detection of glaucoma and other disorders of the eye. Is the patient up to date with their annual eye exam?  Yes  Who is the provider or what is the name of the office in which the patient attends annual eye exams? [facility] If pt is not established with a provider, would they like to be referred to a provider to establish care? No .   Dental Screening: Recommended annual dental exams for proper oral hygiene  Community Resource Referral / Chronic Care Management: CRR required this visit?  No   CCM required this visit?  No     Plan:     I have personally reviewed and noted the following in the patient's chart:   Medical and social history Use of alcohol, tobacco or illicit drugs  Current medications and supplements including opioid prescriptions. Patient is not currently taking opioid prescriptions. Functional  ability and status Nutritional status Physical activity Advanced directives List of other physicians Hospitalizations, surgeries, and ER visits in previous 12 months Vitals Screenings to include cognitive, depression, and falls Referrals and appointments  In addition, I have reviewed and discussed with patient certain preventive protocols, quality metrics, and best practice recommendations. A written personalized care plan for preventive services as well as general preventive health recommendations were provided to patient.     Kandis Fantasia [place], [place]   [id number]   After Visit Summary: (MyChart) Due to this being a telephonic visit, the after visit summary with patients personalized plan was offered to patient via MyChart   Nurse Notes: No concerns at this time

## 2022-12-04 NOTE — Patient Instructions (Signed)
[name] , Thank you for taking time to come for your Medicare Wellness Visit. I appreciate your ongoing commitment to your health goals. Please review the following plan we discussed and let me know if I can assist you in the future.   Referrals/Orders/Follow-Ups/Clinician Recommendations: Aim for 30 minutes of exercise or brisk walking, 6-8 glasses of water, and 5 servings of fruits and vegetables each day.  You have an order for:  []   2D Mammogram  []   3D Mammogram  [x]   Bone Density     Please call for appointment:   [facility] at [facility] [address] [place], [place] [address] [phone]  Make sure to wear two-piece clothing.  No lotions, powders, or deodorants the day of the appointment. Make sure to bring picture ID and insurance card.  Bring list of medications you are currently taking including any supplements.   Schedule your [place] screening mammogram through MyChart!   Log into your MyChart account.  Go to 'Visit' (or 'Appointments' if on mobile App) --> Schedule an Appointment  Under 'Select a Reason for Visit' choose the Mammogram Screening option.  Complete the pre-visit questions and select the time and place that best fits your schedule.    This is a list of the screening recommended for you and due dates:  Health Maintenance  Topic Date Due   Zoster (Shingles) Vaccine (1 of 2) Never done   COVID-19 Vaccine (3 - 2023-24 season) 09/28/2022   Pneumonia Vaccine (1 of 1 - PCV) 08/08/2023*   Medicare Annual Wellness Visit  12/04/2023   Mammogram  07/10/2024   Colon Cancer Screening  08/26/2027   DTaP/Tdap/Td vaccine (3 - Td or Tdap) 07/12/2028   DEXA scan (bone density measurement)  Completed   Hepatitis C Screening  Completed   HPV Vaccine  Aged Out  *Topic was postponed. The date shown is not the original due date.    Advanced directives: (ACP Link)Information on Advanced Care Planning can be found at [facility] [place] Advance Health Care Directives Advance Health Care Directives ([email or web address]   Next Medicare Annual Wellness Visit scheduled for next year: Yes

## 2022-12-23 ENCOUNTER — Ambulatory Visit (HOSPITAL_COMMUNITY)
Admission: RE | Admit: 2022-12-23 | Discharge: 2022-12-23 | Disposition: A | Discharge status: Home / Self Care | Payer: Medicare PPO | Source: Ambulatory Visit | Attending: Family Medicine | Admitting: Family Medicine

## 2022-12-23 DIAGNOSIS — M8589 Other specified disorders of bone density and structure, multiple sites: Secondary | ICD-10-CM | POA: Insufficient documentation

## 2022-12-23 DIAGNOSIS — Z780 Asymptomatic menopausal state: Secondary | ICD-10-CM | POA: Diagnosis not present

## 2023-02-09 ENCOUNTER — Encounter: Payer: Self-pay | Admitting: Family Medicine

## 2023-02-09 ENCOUNTER — Ambulatory Visit (INDEPENDENT_AMBULATORY_CARE_PROVIDER_SITE_OTHER): Payer: Medicare PPO | Admitting: Family Medicine

## 2023-02-09 VITALS — BP 114/62 | HR 77 | Temp 98.0°F | Ht 62.0 in | Wt 126.0 lb

## 2023-02-09 DIAGNOSIS — R053 Chronic cough: Secondary | ICD-10-CM | POA: Diagnosis not present

## 2023-02-09 MED ORDER — PANTOPRAZOLE SODIUM 40 MG PO TBEC: 40.0000 mg | DELAYED_RELEASE_TABLET | Freq: Every day | ORAL | 3 refills | Status: DC

## 2023-02-09 MED ORDER — FLUTICASONE PROPIONATE 50 MCG/ACT NA SUSP: 2.0000 | Freq: Every day | NASAL | 6 refills | Status: DC

## 2023-02-09 NOTE — Progress Notes (Signed)
 Subjective:    Patient ID: Kylie Duffy, female    DOB: 1955/08/11, 68 y.o.   MRN: [id number] Patient has had an irritant cough now for 4 months.  She denies any fever or chills.  She denies any chest pain.  She denies any purulent sputum or hemoptysis.  She denies any night sweats or weight loss.  She denies any myalgias or bone pain.  She describes it as a tickle sensation in the back of her throat. 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 might limit the vision to less than 20/20   Osteopenia    PONV (postoperative nausea and vomiting)    Vaginal dryness 12/03/2012   Past Surgical History:  Procedure Laterality Date   ABDOMINAL HYSTERECTOMY     BREAST BIOPSY Left    BREAST EXCISIONAL BIOPSY Right    age 34s benign   CLOSED REDUCTION WRIST FRACTURE Right 02/24/2020   Procedure: CLOSED REDUCTION WRIST with pin placement;  Surgeon: Margrette Taft BRAVO, MD;  Location: [facility];  Service: Orthopedics;  Laterality: Right;   COLONOSCOPY N/A 08/25/2016   Procedure: COLONOSCOPY;  Surgeon: Harvey Margo CROME, MD;  [facility]: [facility];  Service: Endoscopy;  Laterality: N/A;   DIAGNOSTIC LAPAROSCOPY     HARDWARE REMOVAL Right 05/29/2020   Procedure: PIN REMOVAL RIGHT WRIST;  Surgeon: Margrette Taft BRAVO, MD;  Location: [facility];  Service: Orthopedics;  Laterality: Right;   KNEE ARTHROSCOPY WITH MEDIAL MENISECTOMY  01/26/2012   Procedure: KNEE ARTHROSCOPY WITH MEDIAL MENISECTOMY;  Surgeon: Taft BRAVO Margrette, MD;  Location: [facility];  Service: Orthopedics;  Laterality: Left;   POLYPECTOMY  08/25/2016   Procedure: POLYPECTOMY;  Surgeon: Harvey Margo CROME, MD;  [facility]: [facility];  Service:  Endoscopy;;   TONSILLECTOMY     VITRECTOMY Right 06/04/2022   Current Outpatient Medications on File Prior to Visit  Medication Sig Dispense Refill   Calcium  Carbonate-Vitamin D  (CALCIUM  600 + D PO) Take 3 tablets by mouth daily. Takes 3 chewables daily Calcifood     ibuprofen  (ADVIL ) 600 MG tablet Take 1 tablet (600 mg total) by mouth every 8 (eight) hours as needed. 30 tablet 0   Multiple Vitamin (MULITIVITAMIN WITH MINERALS) TABS Take 1 tablet by mouth daily.     Omega-3 Fatty Acids (FISH OIL) 1200 MG CAPS Take by mouth.     OVER THE COUNTER MEDICATION Take 1 each by mouth daily. Cyruta     OVER THE COUNTER MEDICATION Take 2 each by mouth daily. Cholaplex     SUMAtriptan  (IMITREX ) 100 MG tablet Take 1 tablet (100 mg total) by mouth every 2 (two) hours as needed for migraine or headache. May repeat in 2 hours if needed. 10 tablet 1   Vitamin D , Cholecalciferol, 25 MCG (1000 UT) TABS Take 1,000 Units by mouth 2 (two) times daily.      No current facility-administered medications on file prior to visit.   No Known Allergies Social History   Socioeconomic History   Marital status: Married    Spouse name: Not on file   Number of children: Not on file   Years of education: Not on file   Highest education  level: Not on file  Occupational History   Not on file  Tobacco Use   Smoking status: Never   Smokeless tobacco: Never  Vaping Use   Vaping status: Never Used  Substance and Sexual Activity   Alcohol use: Not Currently   Drug use: No   Sexual activity: Yes    Birth control/protection: Surgical, Post-menopausal    Comment: hyst  Other Topics Concern   Not on file  Social History Narrative   Not on file   Social Drivers of Health   Financial Resource Strain: Low Risk  (12/04/2022)   Overall Financial Resource Strain (CARDIA)    Difficulty of Paying Living Expenses: Not hard at all  Food Insecurity: No Food Insecurity (12/04/2022)   Hunger Vital Sign    Worried About  Running Out of Food in the Last Year: Never true    Ran Out of Food in the Last Year: Never true  Transportation Needs: No Transportation Needs (12/04/2022)   PRAPARE - Administrator, Civil Service (Medical): No    Lack of Transportation (Non-Medical): No  Physical Activity: Sufficiently Active (12/04/2022)   Exercise Vital Sign    Days of Exercise per Week: 5 days    Minutes of Exercise per Session: 30 min  Stress: No Stress Concern Present (12/04/2022)   [employer] of Occupational Health - Occupational Stress Questionnaire    Feeling of Stress : Not at all  Social Connections: Moderately Integrated (12/04/2022)   Social Connection and Isolation Panel [NHANES]    Frequency of Communication with Friends and Family: More than three times a week    Frequency of Social Gatherings with Friends and Family: Once a week    Attends Religious Services: More than 4 times per year    Active Member of [employer] or Organizations: No    Attends Banker Meetings: Never    Marital Status: Married  Catering Manager Violence: Not At Risk (12/04/2022)   Humiliation, Afraid, Rape, and Kick questionnaire    Fear of Current or Ex-Partner: No    Emotionally Abused: No    Physically Abused: No    Sexually Abused: No   Family History  Problem Relation Age of Onset   Hypertension Mother    Hyperlipidemia Mother    Thyroid  disease Mother    Kidney disease Mother    Heart disease Father    Hyperlipidemia Father    Cancer Maternal Aunt    Cancer Maternal Uncle    Cancer Paternal Aunt    Cancer Paternal Uncle    Diabetes Maternal Grandmother    Cancer Paternal Grandmother        pancreatic   Hyperlipidemia Brother        borderline   Cancer Paternal Uncle    Alzheimer's disease Paternal Aunt    Colon cancer Neg Hx    Colon polyps Neg Hx    Breast cancer Neg Hx       Review of Systems  Respiratory:  Positive for cough.   Neurological:  Positive for headaches.  All  other systems reviewed and are negative.      Objective:   Physical Exam Vitals reviewed.  Constitutional:      General: She is not in acute distress.    Appearance: Normal appearance. She is normal weight. She is not ill-appearing, toxic-appearing or diaphoretic.  HENT:     Head: Normocephalic and atraumatic.     Right Ear: Tympanic membrane, ear canal and external ear normal.  There is no impacted cerumen.     Left Ear: Tympanic membrane, ear canal and external ear normal. There is no impacted cerumen.     Nose: Nose normal. No congestion or rhinorrhea.     Mouth/Throat:     Mouth: Mucous membranes are moist.     Pharynx: Oropharynx is clear. No oropharyngeal exudate or posterior oropharyngeal erythema.  Eyes:     General: No visual field deficit.    Extraocular Movements: Extraocular movements intact.     Conjunctiva/sclera: Conjunctivae normal.     Pupils: Pupils are equal, round, and reactive to light.  Neck:     Vascular: No carotid bruit.  Cardiovascular:     Rate and Rhythm: Normal rate and regular rhythm.     Pulses: Normal pulses.     Heart sounds: Normal heart sounds. No murmur heard.    No friction rub. No gallop.  Pulmonary:     Effort: Pulmonary effort is normal. No respiratory distress.     Breath sounds: Normal breath sounds. No stridor. No wheezing, rhonchi or rales.  Abdominal:     General: Abdomen is flat. Bowel sounds are normal. There is no distension.     Palpations: Abdomen is soft. There is no mass.     Tenderness: There is no abdominal tenderness. There is no guarding or rebound.  Musculoskeletal:        General: No swelling or tenderness. Normal range of motion.     Cervical back: Normal range of motion. No rigidity or tenderness.     Right lower leg: No edema.     Left lower leg: No edema.  Lymphadenopathy:     Cervical: No cervical adenopathy.  Skin:    General: Skin is warm.     Coloration: Skin is not jaundiced or pale.     Findings: No  bruising, erythema, lesion or rash.  Neurological:     General: No focal deficit present.     Mental Status: She is alert and oriented to person, place, and time. Mental status is at baseline.     Cranial Nerves: Facial asymmetry present. No cranial nerve deficit or dysarthria.     Sensory: No sensory deficit.     Motor: No weakness.     Coordination: Coordination normal.     Gait: Gait normal.     Deep Tendon Reflexes: Reflexes normal.  Psychiatric:        Mood and Affect: Mood normal.        Behavior: Behavior normal.        Thought Content: Thought content normal.        Judgment: Judgment normal.          Assessment & Plan:  Chronic cough I suspect upper airway cough syndrome.  Recommended trying Protonix  40 mg a day coupled with Flonase  2 sprays each nostril daily and reassess in 3 to 4 weeks.  Elevate the head of the bed 2 inches.

## 2023-03-05 DIAGNOSIS — H35341 Macular cyst, hole, or pseudohole, right eye: Secondary | ICD-10-CM | POA: Diagnosis not present

## 2023-03-05 DIAGNOSIS — H35343 Macular cyst, hole, or pseudohole, bilateral: Secondary | ICD-10-CM | POA: Diagnosis not present

## 2023-03-05 DIAGNOSIS — H35371 Puckering of macula, right eye: Secondary | ICD-10-CM | POA: Diagnosis not present

## 2023-03-05 DIAGNOSIS — H353113 Nonexudative age-related macular degeneration, right eye, advanced atrophic without subfoveal involvement: Secondary | ICD-10-CM | POA: Diagnosis not present

## 2023-03-05 DIAGNOSIS — H35342 Macular cyst, hole, or pseudohole, left eye: Secondary | ICD-10-CM | POA: Diagnosis not present

## 2023-03-18 DIAGNOSIS — H35341 Macular cyst, hole, or pseudohole, right eye: Secondary | ICD-10-CM | POA: Diagnosis not present

## 2023-03-25 DIAGNOSIS — H353113 Nonexudative age-related macular degeneration, right eye, advanced atrophic without subfoveal involvement: Secondary | ICD-10-CM | POA: Diagnosis not present

## 2023-03-25 DIAGNOSIS — H35371 Puckering of macula, right eye: Secondary | ICD-10-CM | POA: Diagnosis not present

## 2023-03-25 DIAGNOSIS — S0501XA Injury of conjunctiva and corneal abrasion without foreign body, right eye, initial encounter: Secondary | ICD-10-CM | POA: Diagnosis not present

## 2023-03-25 DIAGNOSIS — H35341 Macular cyst, hole, or pseudohole, right eye: Secondary | ICD-10-CM | POA: Diagnosis not present

## 2023-04-01 DIAGNOSIS — S0501XD Injury of conjunctiva and corneal abrasion without foreign body, right eye, subsequent encounter: Secondary | ICD-10-CM | POA: Diagnosis not present

## 2023-04-01 DIAGNOSIS — H35341 Macular cyst, hole, or pseudohole, right eye: Secondary | ICD-10-CM | POA: Diagnosis not present

## 2023-04-01 DIAGNOSIS — H35371 Puckering of macula, right eye: Secondary | ICD-10-CM | POA: Diagnosis not present

## 2023-04-01 DIAGNOSIS — H353113 Nonexudative age-related macular degeneration, right eye, advanced atrophic without subfoveal involvement: Secondary | ICD-10-CM | POA: Diagnosis not present

## 2023-04-22 DIAGNOSIS — H353113 Nonexudative age-related macular degeneration, right eye, advanced atrophic without subfoveal involvement: Secondary | ICD-10-CM | POA: Diagnosis not present

## 2023-04-22 DIAGNOSIS — H353122 Nonexudative age-related macular degeneration, left eye, intermediate dry stage: Secondary | ICD-10-CM | POA: Diagnosis not present

## 2023-04-22 DIAGNOSIS — H35371 Puckering of macula, right eye: Secondary | ICD-10-CM | POA: Diagnosis not present

## 2023-04-22 DIAGNOSIS — S0501XD Injury of conjunctiva and corneal abrasion without foreign body, right eye, subsequent encounter: Secondary | ICD-10-CM | POA: Diagnosis not present

## 2023-04-22 DIAGNOSIS — H35341 Macular cyst, hole, or pseudohole, right eye: Secondary | ICD-10-CM | POA: Diagnosis not present

## 2023-05-11 ENCOUNTER — Emergency Department (HOSPITAL_COMMUNITY)

## 2023-05-11 ENCOUNTER — Observation Stay (HOSPITAL_COMMUNITY)

## 2023-05-11 ENCOUNTER — Encounter (HOSPITAL_COMMUNITY): Payer: Self-pay | Admitting: Internal Medicine

## 2023-05-11 ENCOUNTER — Observation Stay (HOSPITAL_COMMUNITY)
Admission: EM | Admit: 2023-05-11 | Discharge: 2023-05-12 | Disposition: A | Discharge status: Home / Self Care | Attending: Internal Medicine | Admitting: Internal Medicine

## 2023-05-11 ENCOUNTER — Other Ambulatory Visit: Payer: Self-pay

## 2023-05-11 DIAGNOSIS — H35343 Macular cyst, hole, or pseudohole, bilateral: Secondary | ICD-10-CM | POA: Insufficient documentation

## 2023-05-11 DIAGNOSIS — H34231 Retinal artery branch occlusion, right eye: Secondary | ICD-10-CM | POA: Diagnosis not present

## 2023-05-11 DIAGNOSIS — Z7901 Long term (current) use of anticoagulants: Secondary | ICD-10-CM | POA: Insufficient documentation

## 2023-05-11 DIAGNOSIS — G459 Transient cerebral ischemic attack, unspecified: Secondary | ICD-10-CM | POA: Diagnosis not present

## 2023-05-11 DIAGNOSIS — R9431 Abnormal electrocardiogram [ECG] [EKG]: Secondary | ICD-10-CM | POA: Diagnosis not present

## 2023-05-11 DIAGNOSIS — Z79899 Other long term (current) drug therapy: Secondary | ICD-10-CM | POA: Insufficient documentation

## 2023-05-11 DIAGNOSIS — H53461 Homonymous bilateral field defects, right side: Secondary | ICD-10-CM | POA: Insufficient documentation

## 2023-05-11 DIAGNOSIS — H547 Unspecified visual loss: Secondary | ICD-10-CM | POA: Diagnosis not present

## 2023-05-11 DIAGNOSIS — H538 Other visual disturbances: Secondary | ICD-10-CM | POA: Diagnosis present

## 2023-05-11 DIAGNOSIS — S0501XD Injury of conjunctiva and corneal abrasion without foreign body, right eye, subsequent encounter: Secondary | ICD-10-CM | POA: Diagnosis not present

## 2023-05-11 DIAGNOSIS — H353122 Nonexudative age-related macular degeneration, left eye, intermediate dry stage: Secondary | ICD-10-CM | POA: Diagnosis not present

## 2023-05-11 DIAGNOSIS — H35351 Cystoid macular degeneration, right eye: Secondary | ICD-10-CM | POA: Diagnosis not present

## 2023-05-11 DIAGNOSIS — H35342 Macular cyst, hole, or pseudohole, left eye: Secondary | ICD-10-CM | POA: Diagnosis present

## 2023-05-11 DIAGNOSIS — H353113 Nonexudative age-related macular degeneration, right eye, advanced atrophic without subfoveal involvement: Secondary | ICD-10-CM | POA: Diagnosis not present

## 2023-05-11 DIAGNOSIS — H35371 Puckering of macula, right eye: Secondary | ICD-10-CM | POA: Diagnosis not present

## 2023-05-11 DIAGNOSIS — E785 Hyperlipidemia, unspecified: Secondary | ICD-10-CM | POA: Diagnosis present

## 2023-05-11 DIAGNOSIS — I6529 Occlusion and stenosis of unspecified carotid artery: Secondary | ICD-10-CM | POA: Diagnosis not present

## 2023-05-11 LAB — COMPREHENSIVE METABOLIC PANEL WITH GFR
ALT: 19 U/L (ref 0–44)
AST: 21 U/L (ref 15–41)
Albumin: 4.4 g/dL (ref 3.5–5.0)
Alkaline Phosphatase: 60 U/L (ref 38–126)
Anion gap: 12 (ref 5–15)
BUN: 14 mg/dL (ref 8–23)
CO2: 25 mmol/L (ref 22–32)
Calcium: 9.7 mg/dL (ref 8.9–10.3)
Chloride: 103 mmol/L (ref 98–111)
Creatinine, Ser: 0.73 mg/dL (ref 0.44–1.00)
GFR, Estimated: >60 mL/min (ref >60)
Glucose, Bld: 104 mg/dL — ABNORMAL HIGH (ref 70–99)
Potassium: 3.9 mmol/L (ref 3.5–5.1)
Sodium: 140 mmol/L (ref 135–145)
Total Bilirubin: 0.6 mg/dL (ref 0.0–1.2)
Total Protein: 7.6 g/dL (ref 6.5–8.1)

## 2023-05-11 LAB — ECHOCARDIOGRAM COMPLETE
Area-P 1/2: 4.8 cm2
Height: 62 in
P 1/2 time: 400 ms
S' Lateral: 3 cm
Weight: 2000 [oz_av]

## 2023-05-11 LAB — DIFFERENTIAL
Abs Immature Granulocytes: 0.04 10*3/uL (ref 0.00–0.07)
Basophils Absolute: 0.1 10*3/uL (ref 0.0–0.1)
Basophils Relative: 1 %
Eosinophils Absolute: 0.1 10*3/uL (ref 0.0–0.5)
Eosinophils Relative: 1 %
Immature Granulocytes: 0 %
Lymphocytes Relative: 20 %
Lymphs Abs: 1.9 10*3/uL (ref 0.7–4.0)
Monocytes Absolute: 0.4 10*3/uL (ref 0.1–1.0)
Monocytes Relative: 4 %
Neutro Abs: 7.1 10*3/uL (ref 1.7–7.7)
Neutrophils Relative %: 74 %

## 2023-05-11 LAB — I-STAT CHEM 8, ED
BUN: 16 mg/dL (ref 8–23)
Calcium, Ion: 1.12 mmol/L — ABNORMAL LOW (ref 1.15–1.40)
Chloride: 103 mmol/L (ref 98–111)
Creatinine, Ser: 0.8 mg/dL (ref 0.44–1.00)
Glucose, Bld: 104 mg/dL — ABNORMAL HIGH (ref 70–99)
HCT: 48 % — ABNORMAL HIGH (ref 36.0–46.0)
Hemoglobin: 16.3 g/dL — ABNORMAL HIGH (ref 12.0–15.0)
Potassium: 3.9 mmol/L (ref 3.5–5.1)
Sodium: 139 mmol/L (ref 135–145)
TCO2: 26 mmol/L (ref 22–32)

## 2023-05-11 LAB — LIPID PANEL
Cholesterol: 227 mg/dL — ABNORMAL HIGH (ref 0–200)
HDL: 67 mg/dL (ref >40)
LDL Cholesterol: 148 mg/dL — ABNORMAL HIGH (ref 0–99)
Total CHOL/HDL Ratio: 3.4 ratio
Triglycerides: 61 mg/dL (ref <150)
VLDL: 12 mg/dL (ref 0–40)

## 2023-05-11 LAB — CBC
HCT: 48.1 % — ABNORMAL HIGH (ref 36.0–46.0)
Hemoglobin: 15.4 g/dL — ABNORMAL HIGH (ref 12.0–15.0)
MCH: 29.3 pg (ref 26.0–34.0)
MCHC: 32 g/dL (ref 30.0–36.0)
MCV: 91.6 fL (ref 80.0–100.0)
Platelets: 733 10*3/uL — ABNORMAL HIGH (ref 150–400)
RBC: 5.25 MIL/uL — ABNORMAL HIGH (ref 3.87–5.11)
RDW: 14.5 % (ref 11.5–15.5)
WBC: 9.7 10*3/uL (ref 4.0–10.5)
nRBC: 0 % (ref 0.0–0.2)

## 2023-05-11 LAB — PROTIME-INR
INR: 1 (ref 0.8–1.2)
Prothrombin Time: 13.7 s (ref 11.4–15.2)

## 2023-05-11 LAB — ETHANOL: Alcohol, Ethyl (B): <10 mg/dL (ref <10)

## 2023-05-11 LAB — CBG MONITORING, ED: Glucose-Capillary: 114 mg/dL — ABNORMAL HIGH (ref 70–99)

## 2023-05-11 LAB — HIV ANTIBODY (ROUTINE TESTING W REFLEX): HIV Screen 4th Generation wRfx: NONREACTIVE

## 2023-05-11 LAB — HEMOGLOBIN A1C
Hgb A1c MFr Bld: 5.5 % (ref 4.8–5.6)
Mean Plasma Glucose: 111.15 mg/dL

## 2023-05-11 LAB — APTT: aPTT: 33 s (ref 24–36)

## 2023-05-11 MED ORDER — ACETAMINOPHEN 650 MG RE SUPP: 650.0000 mg | Freq: Four times a day (QID) | RECTAL | Status: DC | PRN

## 2023-05-11 MED ORDER — IOHEXOL 350 MG/ML SOLN
75.0000 mL | Freq: Once | INTRAVENOUS | Status: AC | PRN
  Administered 2023-05-11: 75 mL via INTRAVENOUS

## 2023-05-11 MED ORDER — GADOBUTROL 1 MMOL/ML IV SOLN
5.5000 mL | Freq: Once | INTRAVENOUS | Status: AC | PRN
  Administered 2023-05-11: 5.5 mL via INTRAVENOUS

## 2023-05-11 MED ORDER — ASPIRIN 325 MG PO TABS
325.0000 mg | ORAL_TABLET | Freq: Every day | ORAL | Status: DC
  Administered 2023-05-11: 325 mg via ORAL
  Filled 2023-05-11: qty 1

## 2023-05-11 MED ORDER — ENOXAPARIN SODIUM 40 MG/0.4ML IJ SOSY
40.0000 mg | PREFILLED_SYRINGE | INTRAMUSCULAR | Status: DC
  Administered 2023-05-11: 40 mg via SUBCUTANEOUS
  Filled 2023-05-11: qty 0.4

## 2023-05-11 MED ORDER — ACETAMINOPHEN 325 MG PO TABS: 650.0000 mg | ORAL_TABLET | Freq: Four times a day (QID) | ORAL | Status: DC | PRN

## 2023-05-11 MED ORDER — ASPIRIN 300 MG RE SUPP: 300.0000 mg | Freq: Every day | RECTAL | Status: DC

## 2023-05-11 MED ORDER — SODIUM CHLORIDE 0.9% FLUSH: 3.0000 mL | Freq: Once | INTRAVENOUS | Status: DC

## 2023-05-11 MED ORDER — STROKE: EARLY STAGES OF RECOVERY BOOK
Freq: Once | Status: AC
  Filled 2023-05-11: qty 1

## 2023-05-11 NOTE — Progress Notes (Signed)
  Echocardiogram 2D Echocardiogram has been performed.  Kylie Duffy 05/11/2023, 5:40 PM

## 2023-05-11 NOTE — H&P (Signed)
 History and Physical    Kylie Duffy KVQ:[id number] DOB: 10-10-1955 DOA: 05/11/2023  PCP: Austine Lefort, MD  Patient coming from: Home via ophthalmology clinic  I have personally briefly reviewed patient's old medical records available.   Chief Complaint: Could not see from upper part of the eye.  HPI: Kylie Duffy is a 68 y.o. female with medical history significant of hyperlipidemia, retinal hole status post repair and silicon placement about 2 months ago had an episode yesterday evening where she was not able to see from upper part of both of her eyes.  She will look at TV but only able to see the lower part.  It stays about 3 minutes like that and then resolves on its own.  She did not do any intervention.  Denies any nausea vomiting dizziness lightheadedness syncopal or presyncopal episodes.  Denied any facial weakness, numbness, focal weakness of the lower or upper extremities.  Denies any chest pain shortness of breath wheezing or palpitations.  Urine and bowel habits are normal.  She is regularly following up with her ophthalmologist.  Today morning, she went to see her ophthalmologist who did dilated further exam and reportedly found unremarkable so sent her to ER for TIA workup.  Patient currently without any symptoms.  She does occasionally feel floaters that happened last few days. ED Course: Hemodynamically stable.  Neurologically stable.  Electrolytes are adequate.  Initial head CT was normal.  Recommended observation and TIA workup. Neurology was consulted from ER.  Review of Systems: all systems are reviewed and pertinent positive as per HPI otherwise rest are negative.    Past Medical History:  Diagnosis Date   Burning with urination 11/01/2014   Hematuria 07/28/2013   History of UTI    Hyperlipidemia    Nuclear sclerotic cataract of right eye 04/23/2021   Dense cataract, some contribution to the poor vision in the right eye, although ultimately the previous spontaneous  macular hole and closure in the right eye might limit the vision to less than 20/20   Osteopenia    PONV (postoperative nausea and vomiting)    Vaginal dryness 12/03/2012    Past Surgical History:  Procedure Laterality Date   ABDOMINAL HYSTERECTOMY     BREAST BIOPSY Left    BREAST EXCISIONAL BIOPSY Right    age 38s benign   CLOSED REDUCTION WRIST FRACTURE Right 02/24/2020   Procedure: CLOSED REDUCTION WRIST with pin placement;  Surgeon: Darrin Emerald, MD;  Location: [facility];  Service: Orthopedics;  Laterality: Right;   COLONOSCOPY N/A 08/25/2016   Procedure: COLONOSCOPY;  Surgeon: Alyce Jubilee, MD;  [facility];  Service: Endoscopy;  Laterality: N/A;   DIAGNOSTIC LAPAROSCOPY     HARDWARE REMOVAL Right 05/29/2020   Procedure: PIN REMOVAL RIGHT WRIST;  Surgeon: Darrin Emerald, MD;  Location: [facility];  Service: Orthopedics;  Laterality: Right;   KNEE ARTHROSCOPY WITH MEDIAL MENISECTOMY  01/26/2012   Procedure: KNEE ARTHROSCOPY WITH MEDIAL MENISECTOMY;  Surgeon: Darrin Emerald, MD;  Location: [facility];  Service: Orthopedics;  Laterality: Left;   POLYPECTOMY  08/25/2016   Procedure: POLYPECTOMY;  Surgeon: Alyce Jubilee, MD;  [facility];  Service: Endoscopy;;   TONSILLECTOMY     VITRECTOMY Right 06/04/2022    Social history   reports that she has never smoked. She has never used smokeless tobacco. She reports that she does not currently use alcohol. She reports that she does not use drugs.  No Known  Allergies  Family History  Problem Relation Age of Onset   Hypertension Mother    Hyperlipidemia Mother    Thyroid disease Mother    Kidney disease Mother    Heart disease Father    Hyperlipidemia Father    Cancer Maternal Aunt    Cancer Maternal Uncle    Cancer Paternal Aunt    Cancer Paternal Uncle    Diabetes Maternal Grandmother    Cancer Paternal Grandmother        pancreatic   Hyperlipidemia Brother        borderline   Cancer  Paternal Uncle    Alzheimer's disease Paternal Aunt    Colon cancer Neg Hx    Colon polyps Neg Hx    Breast cancer Neg Hx      Prior to Admission medications   Medication Sig Start Date End Date Taking? Authorizing Provider  b complex vitamins capsule Take 1 capsule by mouth daily.   Yes [provider]  bacitracin-polymyxin b (POLYSPORIN) ophthalmic ointment Place 1 Application into the right eye at bedtime. 04/15/23  Yes [provider]  Calcium Carbonate-Vitamin D (CALCIUM 600 + D PO) Take 3 tablets by mouth daily. Calcifood   Yes [provider]  ibuprofen (ADVIL) 600 MG tablet Take 1 tablet (600 mg total) by mouth every 8 (eight) hours as needed. Patient taking differently: Take 600 mg by mouth daily as needed for mild pain (pain score 1-3) or moderate pain (pain score 4-6). 07/24/22  Yes Domenick Gong, MD  Multiple Vitamin (MULITIVITAMIN WITH MINERALS) TABS Take 1 tablet by mouth daily.   Yes [provider]  Omega-3 Fatty Acids (FISH OIL) 1200 MG CAPS Take 1,200 mg by mouth daily.   Yes [provider]  OVER THE COUNTER MEDICATION Take 1 each by mouth daily. Cyruta   Yes [provider]  OVER THE COUNTER MEDICATION Take 1 tablet by mouth daily. Cholaplex   Yes [provider]  SUMAtriptan (IMITREX) 100 MG tablet Take 1 tablet (100 mg total) by mouth every 2 (two) hours as needed for migraine or headache. May repeat in 2 hours if needed. 03/28/22  Yes Donita Brooks, MD  Vitamin D, Cholecalciferol, 25 MCG (1000 UT) TABS Take 1,000 Units by mouth 2 (two) times daily.    Yes [provider]  fluticasone (FLONASE) 50 MCG/ACT nasal spray Place 2 sprays into both nostrils daily. Patient not taking: Reported on 05/11/2023 02/09/23   Donita Brooks, MD  ofloxacin (OCUFLOX) 0.3 % ophthalmic solution Place 1 drop into the right eye 4 (four) times daily. Patient not taking: Reported on 05/11/2023 03/05/23   [provider]  pantoprazole (PROTONIX) 40 MG tablet Take 1 tablet (40 mg total) by mouth daily. Patient not taking: Reported on 05/11/2023 02/09/23   Donita Brooks, MD  prednisoLONE acetate (PRED FORTE) 1 % ophthalmic suspension Place 1 drop into the right eye 4 (four) times daily. Patient not taking: Reported on 05/11/2023 03/05/23   [provider]    Physical Exam: Vitals:   05/11/23 1500 05/11/23 1530 05/11/23 1545 05/11/23 1637  BP: 109/66 125/82 114/66 (!) 135/92  Pulse: 87 87 77 76  Resp: 15 18 14 16   Temp:  97.8 F (36.6 C)  97.7 F (36.5 C)  SpO2: 100% 100% 100% 100%  Weight:      Height:        Constitutional: NAD, calm, comfortable Vitals:   05/11/23 1500 05/11/23 1530 05/11/23 1545 05/11/23  1637  BP: 109/66 125/82 114/66 (!) 135/92  Pulse: 87 87 77 76  Resp: 15 18 14 16   Temp:  97.8 F (36.6 C)  97.7 F (36.5 C)  SpO2: 100% 100% 100% 100%  Weight:      Height:       Eyes: PERRL, lids and conjunctivae normal ENMT: Mucous membranes are moist. Posterior pharynx clear of any exudate or lesions.Normal dentition.  Neck: normal, supple, no masses, no thyromegaly Respiratory: clear to auscultation bilaterally, no wheezing, no crackles. Normal respiratory effort. No accessory muscle use.  Cardiovascular: Regular rate and rhythm, no murmurs / rubs / gallops. No extremity edema. 2+ pedal pulses. No carotid bruits.  Abdomen: no tenderness, no masses palpated. No hepatosplenomegaly. Bowel sounds positive.  Musculoskeletal: no clubbing / cyanosis. No joint deformity upper and lower extremities. Good ROM, no contractures. Normal muscle tone.  Skin: no rashes, lesions, ulcers. No induration Neurologic: CN 2-12 grossly intact. Sensation intact, DTR normal. Strength 5/5 in all 4.  No obvious neurological deficits. Psychiatric: Normal judgment and insight. Alert and oriented x 3. Normal mood.     Labs on Admission: I have personally reviewed following labs and  imaging studies  CBC: Recent Labs  Lab 05/11/23 1146 05/11/23 1202  WBC 9.7  --   NEUTROABS 7.1  --   HGB 15.4* 16.3*  HCT 48.1* 48.0*  MCV 91.6  --   PLT 733*  --    Basic Metabolic Panel: Recent Labs  Lab 05/11/23 1146 05/11/23 1202  NA 140 139  K 3.9 3.9  CL 103 103  CO2 25  --   GLUCOSE 104* 104*  BUN 14 16  CREATININE 0.73 0.80  CALCIUM 9.7  --    GFR: Estimated Creatinine Clearance: 54.7 mL/min (by C-G formula based on SCr of 0.8 mg/dL). Liver Function Tests: Recent Labs  Lab 05/11/23 1146  AST 21  ALT 19  ALKPHOS 60  BILITOT 0.6  PROT 7.6  ALBUMIN 4.4   No results for input(s): "LIPASE", "AMYLASE" in the last 168 hours. No results for input(s): "AMMONIA" in the last 168 hours. Coagulation Profile: Recent Labs  Lab 05/11/23 1146  INR 1.0   Cardiac Enzymes: No results for input(s): "CKTOTAL", "CKMB", "CKMBINDEX", "TROPONINI" in the last 168 hours. BNP (last 3 results) No results for input(s): "PROBNP" in the last 8760 hours. HbA1C: No results for input(s): "HGBA1C" in the last 72 hours. CBG: Recent Labs  Lab 05/11/23 1157  GLUCAP 114*   Lipid Profile: No results for input(s): "CHOL", "HDL", "LDLCALC", "TRIG", "CHOLHDL", "LDLDIRECT" in the last 72 hours. Thyroid Function Tests: No results for input(s): "TSH", "T4TOTAL", "FREET4", "T3FREE", "THYROIDAB" in the last 72 hours. Anemia Panel: No results for input(s): "VITAMINB12", "FOLATE", "FERRITIN", "TIBC", "IRON", "RETICCTPCT" in the last 72 hours. Urine analysis:    Component Value Date/Time   COLORURINE YELLOW 12/26/2016 1547   APPEARANCEUR Clear 06/21/2020 1400   LABSPEC 1.015 12/26/2016 1547   PHURINE 7.0 12/26/2016 1547   GLUCOSEU Negative 06/21/2020 1400   GLUCOSEU NEGATIVE 12/26/2016 1547   HGBUR NEGATIVE 12/26/2016 1547   BILIRUBINUR Negative 06/21/2020 1400   KETONESUR NEGATIVE 12/26/2016 1547   PROTEINUR Negative 06/21/2020 1400   UROBILINOGEN 0.2 12/26/2016 1547   NITRITE  Negative 06/21/2020 1400   NITRITE NEGATIVE 12/26/2016 1547   LEUKOCYTESUR 1+ (A) 06/21/2020 1400    Radiological Exams on Admission: CT HEAD WO CONTRAST Result Date: 05/11/2023 CLINICAL DATA:  Vision loss, monocular. EXAM: CT HEAD WITHOUT CONTRAST TECHNIQUE: Contiguous  axial images were obtained from the base of the skull through the vertex without intravenous contrast. RADIATION DOSE REDUCTION: This exam was performed according to the departmental dose-optimization program which includes automated exposure control, adjustment of the mA and/or kV according to patient size and/or use of iterative reconstruction technique. COMPARISON:  Head MRI 11/11/2016 FINDINGS: Brain: There is no evidence of an acute infarct, intracranial hemorrhage, mass, midline shift, or extra-axial fluid collection. Cerebral volume is normal. The ventricles are normal in size. The pituitary remains prominent in size for age, similar to the prior MRI and not evaluated in detail on today's CT although without evidence of mass effect on the optic chiasm. Vascular: No hyperdense vessel. Skull: No acute fracture or suspicious lesion. Sinuses/Orbits: Visualized paranasal sinuses and mastoid air cells are clear. Postoperative changes to the globes. Other: None. IMPRESSION: No evidence of acute intracranial abnormality. Electronically Signed   By: Aundra Lee M.D.   On: 05/11/2023 16:23    EKG: Independently reviewed.  Sinus rhythm.  Tachycardic.  Assessment/Plan Principal Problem:   TIA (transient ischemic attack) Active Problems:   Hyperlipidemia   Macular hole of left eye     Bilateral upper quadrant vision loss, rule out TIA: Admit to monitored unit to evaluate for TIA and stroke. Neurochecks and vital signs as per stroke protocol.  Keep on cardiac monitoring. Patient was not a TPA and vascular intervention candidate because of not having any neurological deficits. Diet, normal swallowing.  Came after eating at home.   Allow regular diet. Antiplatelets, none at home.  Will start aspirin. Statin none at home.  Previous LDL 130.  Taking fish oil.  Will check fasting lipid profile. Blood pressure goals normal.  Normotensive. Consultations, neurology, speech, PT OT MRI of the brain, pending 2D echocardiogram, ordered.  Pending. CTA of the head and neck, ordered.  2.  Macular hole in the right eye: Well-established follow-up with retinal surgery.  Antibiotic ointments to continue.  3.  Hyperlipidemia: As above.  Will check fasting lipid profile.  She will need statins on discharge.   DVT prophylaxis: Lovenox subcu Code Status: Full code Family Communication: None at the bedside Disposition Plan: Home.  Possible tomorrow Consults called: Neurology, called by ER Admission status: Observation.  Cardiac monitor.   Vada Garibaldi MD [facility] Pager [phone]

## 2023-05-11 NOTE — ED Triage Notes (Addendum)
 Pt. Stated, On Feb. 19 I had a macular hole repair with silicone oil . Last night around 0815 I lost the top part of left eye  of my vision between 2-3 minutes then resolved. I saw Dr. Seward Dao this morning and thinks its an artery occlusion and wants me to be worked up for a stroke.

## 2023-05-11 NOTE — ED Notes (Signed)
 Pt to CT scan.

## 2023-05-11 NOTE — ED Provider Notes (Signed)
 [place] EMERGENCY DEPARTMENT AT [facility] Provider Note   CSN: [id number] Arrival date & time: 05/11/23  1113     History  Chief Complaint  Patient presents with   Eye Problem    Kylie Duffy is a 68 y.o. female with past medical history right nuclear cataract retinal surgery, hyperlipidemia presents due to concern for TIA.  Patient states that yesterday around 815, she had a 3-minute episode of vision loss to her right eye in the bilateral upper quadrants.  This resolves on its own.  She denies any fevers, temporal pain, eye pain, trauma to the eye, prior strokes.  Denies any history of A-fib.  She saw her ophthalmologist today, who did a DFE that was unremarkable, suggested to present to the ED for TIA workup.  Patient endorses return to baseline yesterday.  Also states that Saturday, she had an episode in the morning where she had central white perception of vision for just a few seconds after returning from outdoors.   Eye Problem      Home Medications Prior to Admission medications   Medication Sig Start Date End Date Taking? Authorizing Provider  b complex vitamins capsule Take 1 capsule by mouth daily.   Yes [provider]  bacitracin-polymyxin b (POLYSPORIN) ophthalmic ointment Place 1 Application into the right eye at bedtime. 04/15/23  Yes [provider]  Calcium Carbonate-Vitamin D (CALCIUM 600 + D PO) Take 3 tablets by mouth daily. Calcifood   Yes [provider]  ibuprofen (ADVIL) 600 MG tablet Take 1 tablet (600 mg total) by mouth every 8 (eight) hours as needed. Patient taking differently: Take 600 mg by mouth daily as needed for mild pain (pain score 1-3) or moderate pain (pain score 4-6). 07/24/22  Yes Domenick Gong, MD  Multiple Vitamin (MULITIVITAMIN WITH MINERALS) TABS Take 1 tablet by mouth daily.   Yes [provider]  Omega-3 Fatty Acids (FISH OIL) 1200 MG CAPS Take 1,200 mg by mouth daily.   Yes [provider]  OVER THE COUNTER MEDICATION Take 1 each by mouth daily. Cyruta   Yes [provider]  OVER THE COUNTER MEDICATION Take 1 tablet by mouth daily. Cholaplex   Yes [provider]  SUMAtriptan (IMITREX) 100 MG tablet Take 1 tablet (100 mg total) by mouth every 2 (two) hours as needed for migraine or headache. May repeat in 2 hours if needed. 03/28/22  Yes Donita Brooks, MD  Vitamin D, Cholecalciferol, 25 MCG (1000 UT) TABS Take 1,000 Units by mouth 2 (two) times daily.    Yes [provider]  fluticasone (FLONASE) 50 MCG/ACT nasal spray Place 2 sprays into both nostrils daily. Patient not taking: Reported on 05/11/2023 02/09/23   Donita Brooks, MD  ofloxacin (OCUFLOX) 0.3 % ophthalmic solution Place 1 drop into the right eye 4 (four) times daily. Patient not taking: Reported on 05/11/2023 03/05/23   [provider]  pantoprazole (PROTONIX) 40 MG tablet Take 1 tablet (40 mg total) by mouth daily. Patient not taking: Reported on 05/11/2023 02/09/23   Donita Brooks, MD  prednisoLONE acetate (PRED FORTE) 1 % ophthalmic suspension Place 1 drop into the right eye 4 (four) times daily. Patient not taking: Reported on 05/11/2023 03/05/23   [provider]      Allergies    Patient has no known allergies.    Review of Systems   Review of Systems  Physical Exam Updated Vital Signs BP 127/78  Pulse 79   Temp 97.7 F (36.5 C)   Resp 19   Ht 5\' 2"  (1.575 m)   Wt 56.7 kg   SpO2 100%   BMI 22.86 kg/m  Physical Exam Vitals and nursing note reviewed.  Constitutional:      General: She is not in acute distress.    Appearance: She is well-developed.  HENT:     Head: Normocephalic and atraumatic.  Eyes:     Conjunctiva/sclera: Conjunctivae normal.     Comments: Dilated right eye compared to left, s/p outpatient DFE  Cardiovascular:     Rate and Rhythm: Normal rate and regular rhythm.     Heart sounds: No murmur heard. Pulmonary:      Effort: Pulmonary effort is normal. No respiratory distress.     Breath sounds: Normal breath sounds.  Abdominal:     General: There is no distension.     Palpations: Abdomen is soft.     Tenderness: There is no abdominal tenderness. There is no guarding or rebound.  Musculoskeletal:        General: No swelling.     Cervical back: Neck supple.     Right lower leg: No edema.     Left lower leg: No edema.  Skin:    General: Skin is warm and dry.     Capillary Refill: Capillary refill takes less than 2 seconds.  Neurological:     Mental Status: She is alert.     Comments: Visual acuity not tested and unable to accurately examine CN III given recent DFE, otherwise cranial nerves IV through XII intact Symmetric 5/5 strength in bilateral upper and lower extremities, normal finger-to-nose, intact symmetric sensation throughout, no pronator drift  Psychiatric:        Mood and Affect: Mood normal.     ED Results / Procedures / Treatments   Labs (all labs ordered are listed, but only abnormal results are displayed) Labs Reviewed  CBC - Abnormal; Notable for the following components:      Result Value   RBC 5.25 (*)    Hemoglobin 15.4 (*)    HCT 48.1 (*)    Platelets 733 (*)    All other components within normal limits  COMPREHENSIVE METABOLIC PANEL WITH GFR - Abnormal; Notable for the following components:   Glucose, Bld 104 (*)    All other components within normal limits  I-STAT CHEM 8, ED - Abnormal; Notable for the following components:   Glucose, Bld 104 (*)    Calcium, Ion 1.12 (*)    Hemoglobin 16.3 (*)    HCT 48.0 (*)    All other components within normal limits  CBG MONITORING, ED - Abnormal; Notable for the following components:   Glucose-Capillary 114 (*)    All other components within normal limits  PROTIME-INR  APTT  DIFFERENTIAL  ETHANOL  HEMOGLOBIN A1C  HIV ANTIBODY (ROUTINE TESTING W REFLEX)  LIPID PANEL    EKG None  Radiology ECHOCARDIOGRAM  COMPLETE Result Date: 05/11/2023    ECHOCARDIOGRAM REPORT   Patient Name:   Kylie Duffy Date of Exam: 05/11/2023 Medical Rec #:  [id number]     Height:       62.0 in Accession #:    [id number]    Weight:       125.0 lb Date of Birth:  [birth year]     BSA:          1.566 m Patient Age:    64 years  BP:           135/92 mmHg Patient Gender: F             HR:           83 bpm. Exam Location:  Inpatient Procedure: 2D Echo (Both Spectral and Color Flow Doppler were utilized during            procedure). Indications:    TIA  History:        Patient has no prior history of Echocardiogram examinations.                 Risk Factors:Dyslipidemia.  Sonographer:    Dione Franks RDCS Referring Phys: [id number] KUBER GHIMIRE IMPRESSIONS  1. Left ventricular ejection fraction, by estimation, is 60 to 65%. The left ventricle has normal function. The left ventricle has no regional wall motion abnormalities. Left ventricular diastolic parameters are consistent with Grade I diastolic dysfunction (impaired relaxation).  2. Right ventricular systolic function is normal. The right ventricular size is normal. There is normal pulmonary artery systolic pressure.  3. The mitral valve is normal in structure. Mild mitral valve regurgitation. No evidence of mitral stenosis.  4. The aortic valve is tricuspid. There is mild calcification of the aortic valve. Aortic valve regurgitation is mild. No aortic stenosis is present.  5. The inferior vena cava is normal in size with greater than 50% respiratory variability, suggesting right atrial pressure of 3 mmHg. FINDINGS  Left Ventricle: Left ventricular ejection fraction, by estimation, is 60 to 65%. The left ventricle has normal function. The left ventricle has no regional wall motion abnormalities. The left ventricular internal cavity size was normal in size. There is  no left ventricular hypertrophy. Left ventricular diastolic parameters are consistent with Grade I diastolic dysfunction  (impaired relaxation). Right Ventricle: The right ventricular size is normal. No increase in right ventricular wall thickness. Right ventricular systolic function is normal. There is normal pulmonary artery systolic pressure. The tricuspid regurgitant velocity is 2.08 m/s, and  with an assumed right atrial pressure of 3 mmHg, the estimated right ventricular systolic pressure is 20.3 mmHg. Left Atrium: Left atrial size was normal in size. Right Atrium: Right atrial size was normal in size. Pericardium: There is no evidence of pericardial effusion. Mitral Valve: The mitral valve is normal in structure. Mild mitral valve regurgitation. No evidence of mitral valve stenosis. Tricuspid Valve: The tricuspid valve is normal in structure. Tricuspid valve regurgitation is trivial. No evidence of tricuspid stenosis. Aortic Valve: The aortic valve is tricuspid. There is mild calcification of the aortic valve. Aortic valve regurgitation is mild. Aortic regurgitation PHT measures 400 msec. No aortic stenosis is present. Pulmonic Valve: The pulmonic valve was normal in structure. Pulmonic valve regurgitation is trivial. No evidence of pulmonic stenosis. Aorta: The aortic root is normal in size and structure. Venous: The inferior vena cava is normal in size with greater than 50% respiratory variability, suggesting right atrial pressure of 3 mmHg. IAS/Shunts: No atrial level shunt detected by color flow Doppler.  LEFT VENTRICLE PLAX 2D LVIDd:         4.60 cm   Diastology LVIDs:         3.00 cm   LV e' medial:    6.53 cm/s LV PW:         0.80 cm   LV E/e' medial:  9.4 LV IVS:        0.60 cm   LV e' lateral:   9.25 cm/s  LVOT diam:     1.70 cm   LV E/e' lateral: 6.7 LV SV:         39 LV SV Index:   25 LVOT Area:     2.27 cm  RIGHT VENTRICLE             IVC RV Basal diam:  2.40 cm     IVC diam: 1.40 cm RV S prime:     14.90 cm/s TAPSE (M-mode): 2.2 cm LEFT ATRIUM             Index        RIGHT ATRIUM          Index LA diam:        3.10  cm 1.98 cm/m   RA Area:     8.34 cm LA Vol (A2C):   29.0 ml 18.52 ml/m  RA Volume:   15.90 ml 10.16 ml/m LA Vol (A4C):   27.5 ml 17.57 ml/m LA Biplane Vol: 28.0 ml 17.89 ml/m  AORTIC VALVE LVOT Vmax:   104.00 cm/s LVOT Vmean:  69.700 cm/s LVOT VTI:    0.171 m AI PHT:      400 msec  AORTA Ao Root diam: 2.40 cm MITRAL VALVE               TRICUSPID VALVE MV Area (PHT): 4.80 cm    TR Peak grad:   17.3 mmHg MV Decel Time: 158 msec    TR Vmax:        208.00 cm/s MV E velocity: 61.60 cm/s MV A velocity: 69.70 cm/s  SHUNTS MV E/A ratio:  0.88        Systemic VTI:  0.17 m                            Systemic Diam: 1.70 cm Arvilla Meres MD Electronically signed by Arvilla Meres MD Signature Date/Time: 05/11/2023/6:37:30 PM    Final    CT HEAD WO CONTRAST Result Date: 05/11/2023 CLINICAL DATA:  Vision loss, monocular. EXAM: CT HEAD WITHOUT CONTRAST TECHNIQUE: Contiguous axial images were obtained from the base of the skull through the vertex without intravenous contrast. RADIATION DOSE REDUCTION: This exam was performed according to the departmental dose-optimization program which includes automated exposure control, adjustment of the mA and/or kV according to patient size and/or use of iterative reconstruction technique. COMPARISON:  Head MRI 11/11/2016 FINDINGS: Brain: There is no evidence of an acute infarct, intracranial hemorrhage, mass, midline shift, or extra-axial fluid collection. Cerebral volume is normal. The ventricles are normal in size. The pituitary remains prominent in size for age, similar to the prior MRI and not evaluated in detail on today's CT although without evidence of mass effect on the optic chiasm. Vascular: No hyperdense vessel. Skull: No acute fracture or suspicious lesion. Sinuses/Orbits: Visualized paranasal sinuses and mastoid air cells are clear. Postoperative changes to the globes. Other: None. IMPRESSION: No evidence of acute intracranial abnormality. Electronically Signed   By:  Sebastian Ache M.D.   On: 05/11/2023 16:23    Procedures Procedures    Medications Ordered in ED Medications  sodium chloride flush (NS) 0.9 % injection 3 mL (3 mLs Intravenous Not Given 05/11/23 1420)   stroke: early stages of recovery book (has no administration in time range)  aspirin suppository 300 mg ( Rectal See Alternative 05/11/23 1858)    Or  aspirin tablet 325 mg (325 mg Oral Given 05/11/23 1858)  enoxaparin (  LOVENOX) injection 40 mg (40 mg Subcutaneous Given 05/11/23 1858)  acetaminophen (TYLENOL) tablet 650 mg (has no administration in time range)    Or  acetaminophen (TYLENOL) suppository 650 mg (has no administration in time range)  gadobutrol (GADAVIST) 1 MMOL/ML injection 5.5 mL (5.5 mLs Intravenous Contrast Given 05/11/23 1821)    ED Course/ Medical Decision Making/ A&P                                 Medical Decision Making Amount and/or Complexity of Data Reviewed Labs: ordered. Radiology: ordered.  Risk Prescription drug management. Decision regarding hospitalization.   Patient is alert, afebrile, and hemodynamically stable in no acute distress.  Neurologic exam as noted above.  I also reviewed paperwork from ophthalmology visit, confirming no acute retinal changes to the right eye, reassuring against persistent CRAO, CRVO, vitreous hemorrhage, retinal detachment or tear.  Great this time for his transient vision loss is TIA.  No obvious risk factors at this time with no history of A-fib or known carotid disease.  No history of strokes.  No temporal tenderness to suggest GCA.  Majority of patient's workup was obtained through triage, which demonstrates unremarkable CBC and CMP, normal PT and INR, normal APTT, CT head with no acute findings.  I personally interpreted patient's EKG, which demonstrated sinus rhythm with normal intervals and no acute ischemic or dysrhythmic changes.  Neurology was consulted, will see the patient at bedside in the ED.  Would like MRI as  well as CTA head and neck, plan for admission.  I spoke with patient about this, and she is agreeable.  Also updated on workup performed in the ED.  Handoff was given to admitting team, and patient remained stable.        Final Clinical Impression(s) / ED Diagnoses Final diagnoses:  TIA (transient ischemic attack)    Rx / DC Orders ED Discharge Orders     None         Lorain Robson, MD 05/11/23 1911    Flonnie Humphrey, DO 05/11/23 2109

## 2023-05-11 NOTE — Consult Note (Signed)
 NEUROLOGY CONSULT NOTE   Date of service: May 11, 2023 Patient Name: Kylie Duffy MRN:  [id number] DOB:  1956/01/23 Chief Complaint: Transient partial loss of vision in left eye Requesting Provider: Dorcas Carrow, MD  History of Present Illness  Kylie Duffy is a 68 y.o. female with a PMHx of HLD, nuclear sclerotic cataract of right eye, macular hole OD s/p closure, vitrectomy OD (5/24), macular hole OS s/p repair 03/18/23 and osteopenia who presented to the ED this morning after a transient episode of vision loss involving the top part of the visual fields of her left eye. The episode lasted between 2-3 minutes and then resolved. She saw her Ophthalmologist Dr. Luciana Axe this morning where a dilated fundus examination was unremarkable. She was told that it may have been an "artery occlusion" and that she would need to be worked up for a stroke.   Further history from EDP's HPI has been reviewed: "68 y.o. female with past medical history right nuclear cataract retinal surgery, hyperlipidemia presents due to concern for TIA.  Patient states that yesterday around 815, she had a 3-minute episode of vision loss to her right eye in the bilateral upper quadrants.  This resolves on its own.  She denies any fevers, temporal pain, eye pain, trauma to the eye, prior strokes.  Denies any history of A-fib.  She saw her ophthalmologist today, who did a DFE that was unremarkable, suggested to present to the ED for TIA workup.  Patient endorses return to baseline yesterday.  Also states that Saturday, she had an episode in the morning where she had central white perception of vision for just a few seconds after returning from outdoors."  On bedside interview by Neurology, she states that the above visual deficit has not returned. She denies any limb weakness, limb numbness, aphasia, dysarthria, ataxia or confusion. Also with no headache complaints.   On review of the Ophthalmology note and DFE, two possible  Hollenhorst plaques were seen OD. There was vessel stenosis seen on exam. DDx for the transient right monocular altitudinal vision loss consisted of vasospasm versus platelet thrombus followed by spontaneous recanalization. The Ophthalmology report has been scanned into Epic by the ED unit secretary.     ROS  Comprehensive ROS performed and pertinent positives documented in HPI    Past History   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 might limit the vision to less than 20/20   Osteopenia    PONV (postoperative nausea and vomiting)    Vaginal dryness 12/03/2012    Past Surgical History:  Procedure Laterality Date   ABDOMINAL HYSTERECTOMY     BREAST BIOPSY Left    BREAST EXCISIONAL BIOPSY Right    age 82s benign   CLOSED REDUCTION WRIST FRACTURE Right 02/24/2020   Procedure: CLOSED REDUCTION WRIST with pin placement;  Surgeon: Vickki Hearing, MD;  Location: [facility];  Service: Orthopedics;  Laterality: Right;   COLONOSCOPY N/A 08/25/2016   Procedure: COLONOSCOPY;  Surgeon: West Bali, MD;  Location: [facility];  Service: Endoscopy;  Laterality: N/A;   DIAGNOSTIC LAPAROSCOPY     HARDWARE REMOVAL Right 05/29/2020   Procedure: PIN REMOVAL RIGHT WRIST;  Surgeon: Darrin Emerald, MD;  Location: [facility];  Service: Orthopedics;  Laterality: Right;   KNEE ARTHROSCOPY WITH MEDIAL MENISECTOMY  01/26/2012   Procedure: KNEE ARTHROSCOPY WITH MEDIAL MENISECTOMY;  Surgeon: Darrin Emerald, MD;  Location: [facility];  Service: Orthopedics;  Laterality: Left;   POLYPECTOMY  08/25/2016   Procedure: POLYPECTOMY;  Surgeon: Alyce Jubilee, MD;  Location: [facility];  Service: Endoscopy;;    TONSILLECTOMY     VITRECTOMY Right 06/04/2022    Family History: Family History  Problem Relation Age of Onset   Hypertension Mother    Hyperlipidemia Mother    Thyroid disease Mother    Kidney disease Mother    Heart disease Father    Hyperlipidemia Father    Cancer Maternal Aunt    Cancer Maternal Uncle    Cancer Paternal Aunt    Cancer Paternal Uncle    Diabetes Maternal Grandmother    Cancer Paternal Grandmother        pancreatic   Hyperlipidemia Brother        borderline   Cancer Paternal Uncle    Alzheimer's disease Paternal Aunt    Colon cancer Neg Hx    Colon polyps Neg Hx    Breast cancer Neg Hx     Social History  reports that she has never smoked. She has never used smokeless tobacco. She reports that she does not currently use alcohol. She reports that she does not use drugs.  No Known Allergies  Medications   Current Facility-Administered Medications:    [START ON 05/12/2023]  stroke: early stages of recovery book, , Does not apply, Once, Ghimire, Kuber, MD   acetaminophen (TYLENOL) tablet 650 mg, 650 mg, Oral, Q6H PRN **OR** acetaminophen (TYLENOL) suppository 650 mg, 650 mg, Rectal, Q6H PRN, Ghimire, Kuber, MD   aspirin suppository 300 mg, 300 mg, Rectal, Daily **OR** aspirin tablet 325 mg, 325 mg, Oral, Daily, Ghimire, Kuber, MD, 325 mg at 05/11/23 1858   enoxaparin (LOVENOX) injection 40 mg, 40 mg, Subcutaneous, Q24H, Ghimire, Kuber, MD, 40 mg at 05/11/23 1858   sodium chloride flush (NS) 0.9 % injection 3 mL, 3 mL, Intravenous, Once, Horton, Kristie M, DO  Current Outpatient Medications:    b complex vitamins capsule, Take 1 capsule by mouth daily., Disp: , Rfl:    bacitracin-polymyxin b (POLYSPORIN) ophthalmic ointment, Place 1 Application into the right eye at bedtime., Disp: , Rfl:    Calcium Carbonate-Vitamin D (CALCIUM 600 + D PO), Take 3 tablets by mouth daily. Calcifood, Disp: , Rfl:    ibuprofen (ADVIL) 600 MG tablet, Take 1 tablet (600 mg  total) by mouth every 8 (eight) hours as needed. (Patient taking differently: Take 600 mg by mouth daily as needed for mild pain (pain score 1-3) or moderate pain (pain score 4-6).), Disp: 30 tablet, Rfl: 0   Multiple Vitamin (MULITIVITAMIN WITH MINERALS) TABS, Take 1 tablet by mouth daily., Disp: , Rfl:    Omega-3 Fatty Acids (FISH OIL) 1200 MG CAPS, Take 1,200 mg by mouth daily., Disp: , Rfl:    OVER THE COUNTER MEDICATION, Take 1 each by mouth daily. [place], Disp: , Rfl:    OVER THE COUNTER MEDICATION, Take 1 tablet by mouth daily. Cholaplex, Disp: , Rfl:    SUMAtriptan (IMITREX) 100 MG tablet, Take 1 tablet (100 mg total) by mouth every 2 (two) hours as needed for migraine or headache. May repeat in 2 hours if needed., Disp: 10 tablet, Rfl: 1   Vitamin D,  Cholecalciferol, 25 MCG (1000 UT) TABS, Take 1,000 Units by mouth 2 (two) times daily. , Disp: , Rfl:    fluticasone (FLONASE) 50 MCG/ACT nasal spray, Place 2 sprays into both nostrils daily. (Patient not taking: Reported on 06-06-23), Disp: 16 g, Rfl: 6   ofloxacin (OCUFLOX) 0.3 % ophthalmic solution, Place 1 drop into the right eye 4 (four) times daily. (Patient not taking: Reported on Jun 06, 2023), Disp: , Rfl:    pantoprazole (PROTONIX) 40 MG tablet, Take 1 tablet (40 mg total) by mouth daily. (Patient not taking: Reported on 06/06/23), Disp: 30 tablet, Rfl: 3   prednisoLONE acetate (PRED FORTE) 1 % ophthalmic suspension, Place 1 drop into the right eye 4 (four) times daily. (Patient not taking: Reported on 06/06/23), Disp: , Rfl:   Vitals   Vitals:   2023/06/06 1530 Jun 06, 2023 1545 06-06-23 1637 06-06-23 1645  BP: 125/82 114/66 (!) 135/92 127/78  Pulse: 87 77 76 79  Resp: 18 14 16 19   Temp: 97.8 F (36.6 C)  97.7 F (36.5 C)   SpO2: 100% 100% 100% 100%  Weight:      Height:        Body mass index is 22.86 kg/m.  Physical Exam   Physical Exam  HEENT-  [place]/AT    Lungs- Respirations unlabored Extremities- No edema  Neurological  Examination Mental Status: Alert, oriented x 5, thought content appropriate.  Speech fluent without evidence of aphasia.  Able to follow all commands without difficulty. Cranial Nerves: II: Visual fields are intact to confrontation in all 4 quadrants of each eye. Both pupils are round and reactive. Right pupil larger than left in the context of recent pupillary dilatation for funduscopic exam.  III,IV, VI: No ptosis. EOMI.  V: Temp sensation equal bilaterally  VII: Smile symmetric VIII: Hearing intact to voice IX,X: No hoarseness XI: Symmetric shoulder shrug XII: Midline tongue extension Motor: BUE 5/5 proximally and distally BLE 5/5 proximally and distally  No pronator drift.  Sensory: Temp and light touch intact throughout, bilaterally. No extinction to DSS.  Deep Tendon Reflexes: 2+ and symmetric throughout Cerebellar: No ataxia with FNF bilaterally  Gait: Deferred  Labs/Imaging/Neurodiagnostic studies   CBC:  Recent Labs  Lab 06/06/23 1146 Jun 06, 2023 1202  WBC 9.7  --   NEUTROABS 7.1  --   HGB 15.4* 16.3*  HCT 48.1* 48.0*  MCV 91.6  --   PLT 733*  --    Basic Metabolic Panel:  Lab Results  Component Value Date   NA 139 06/06/23   K 3.9 06/06/23   CO2 25 Jun 06, 2023   GLUCOSE 104 (H) 06-Jun-2023   BUN 16 06-Jun-2023   CREATININE 0.80 2023-06-06   CALCIUM 9.7 2023/06/06   GFRNONAA >60 06-Jun-2023   GFRAA 89 07/13/2020   Lipid Panel:  Lab Results  Component Value Date   LDLCALC 130 (H) 07/28/2022   HgbA1c: No results found for: "HGBA1C" Urine Drug Screen: No results found for: "LABOPIA", "COCAINSCRNUR", "LABBENZ", "AMPHETMU", "THCU", "LABBARB"  Alcohol Level     Component Value Date/Time   ETH <10 Jun 06, 2023 1146   INR  Lab Results  Component Value Date   INR 1.0 06-06-2023   APTT  Lab Results  Component Value Date   APTT 33 06/06/2023   TTE:  1. Left ventricular ejection fraction, by estimation, is 60 to 65%. The  left ventricle has normal  function. The left ventricle has no regional  wall motion abnormalities. Left ventricular diastolic parameters are  consistent with Grade I  diastolic  dysfunction (impaired relaxation).   2. Right ventricular systolic function is normal. The right ventricular  size is normal. There is normal pulmonary artery systolic pressure.   3. The mitral valve is normal in structure. Mild mitral valve  regurgitation. No evidence of mitral stenosis.   4. The aortic valve is tricuspid. There is mild calcification of the  aortic valve. Aortic valve regurgitation is mild. No aortic stenosis is  present.   5. The inferior vena cava is normal in size with greater than 50%  respiratory variability, suggesting right atrial pressure of 3 mmHg.    ASSESSMENT  Kylie Duffy is a 68 y.o. female presenting from her Ophthalmologist's office for stroke workup after experiencing an altitudinal visual field deficit lasting for about 2-3 minutes. Her symptoms were most consistent with a transient BRAO due to vasospasm versus platelet thrombus followed by spontaneous recanalization, per Ophthalmology. Two possible Hollenhorst plaques were also noted OD.  - Neurological exam is nonfocal. No visual field cut appreciated in either eye. She does have chronic vision loss due to macular degeneration and cataracts.  - CTA of head and neck: No large vessel occlusion or proximal hemodynamically significant stenosis. - MRI brain: No evidence of acute intracranial abnormality.   - TTE: LVEF 60 to 65%. No mural thrombus or valvular vegetation mentioned in the report.  - EKG: Normal sinus rhythm; Right atrial enlargement; Borderline ECG; No previous ECGs available. - DDx for her presentation now leans more in favor of branch retinal artery vasospasm as the etiology given negative work up above. However, two possible Hollenhorst plaques seen OD by Ophthalmology do remain suspicious for possible unstable right ICA plaque not visible on  CTA. No headache to militate in favor of temporal arteritis, but will obtain ESR and CRP.   RECOMMENDATIONS  - Carotid ultrasound (ordered) - ESR and CRP (ordered) - ASA 325 mg po every day has been ordered - Stroke Team to follow in the AM ______________________________________________________________________    Hope Ly, Henriette Hesser, MD [facility]

## 2023-05-12 ENCOUNTER — Observation Stay (HOSPITAL_BASED_OUTPATIENT_CLINIC_OR_DEPARTMENT_OTHER)

## 2023-05-12 DIAGNOSIS — H53122 Transient visual loss, left eye: Secondary | ICD-10-CM

## 2023-05-12 DIAGNOSIS — E785 Hyperlipidemia, unspecified: Secondary | ICD-10-CM | POA: Diagnosis not present

## 2023-05-12 DIAGNOSIS — G459 Transient cerebral ischemic attack, unspecified: Secondary | ICD-10-CM | POA: Diagnosis not present

## 2023-05-12 DIAGNOSIS — H53139 Sudden visual loss, unspecified eye: Secondary | ICD-10-CM

## 2023-05-12 DIAGNOSIS — I10 Essential (primary) hypertension: Secondary | ICD-10-CM

## 2023-05-12 LAB — SEDIMENTATION RATE: Sed Rate: 3 mm/h (ref 0–22)

## 2023-05-12 LAB — C-REACTIVE PROTEIN: CRP: <0.5 mg/dL (ref <1.0)

## 2023-05-12 MED ORDER — ASPIRIN 81 MG PO TBEC: 81.0000 mg | DELAYED_RELEASE_TABLET | Freq: Every day | ORAL | 12 refills | Status: DC

## 2023-05-12 MED ORDER — CLOPIDOGREL BISULFATE 75 MG PO TABS: 75.0000 mg | ORAL_TABLET | Freq: Every day | ORAL | 0 refills | Status: AC

## 2023-05-12 MED ORDER — CLOPIDOGREL BISULFATE 75 MG PO TABS
75.0000 mg | ORAL_TABLET | Freq: Every day | ORAL | Status: DC
  Administered 2023-05-12: 75 mg via ORAL
  Filled 2023-05-12: qty 1

## 2023-05-12 MED ORDER — ASPIRIN 81 MG PO TBEC
81.0000 mg | DELAYED_RELEASE_TABLET | Freq: Every day | ORAL | Status: DC
  Administered 2023-05-12: 81 mg via ORAL
  Filled 2023-05-12: qty 1

## 2023-05-12 MED ORDER — ROSUVASTATIN CALCIUM 40 MG PO TABS: 40.0000 mg | ORAL_TABLET | Freq: Every day | ORAL | 0 refills | Status: DC

## 2023-05-12 MED ORDER — ROSUVASTATIN CALCIUM 20 MG PO TABS
40.0000 mg | ORAL_TABLET | Freq: Every day | ORAL | Status: DC
  Administered 2023-05-12: 40 mg via ORAL
  Filled 2023-05-12: qty 2

## 2023-05-12 NOTE — TOC Transition Note (Signed)
 Transition of Care [facility]) - Discharge Note   Patient Details  Name: SILVERIA BOTZ MRN: [id number] Date of Birth: May 23, 1955  [facility]) CM/SW Contact:  Jonathan Neighbor, RN Phone Number: 05/12/2023, 1:34 PM   Clinical Narrative:     Pt is discharging home with self care.  Pt is from home with her spouse. Spouse is with her most of the time.  No DME.  Pt drives self but spouse can assist if needed.  Pt manages her own medications and denies any issues.  No follow up per therapies.  Spouse will transport home.   Final next level of care: Home/Self Care Barriers to Discharge: No Barriers Identified   Patient Goals and CMS Choice            Discharge Placement                       Discharge Plan and Services Additional resources added to the After Visit Summary for                                       Social Drivers of Health (SDOH) Interventions SDOH Screenings   Food Insecurity: No Food Insecurity (05/12/2023)  Housing: Low Risk  (05/12/2023)  Transportation Needs: No Transportation Needs (05/12/2023)  Utilities: Not At Risk (05/12/2023)  Alcohol Screen: Low Risk  (12/04/2022)  Depression (PHQ2-9): Low Risk  (02/09/2023)  Financial Resource Strain: Low Risk  (12/04/2022)  Physical Activity: Sufficiently Active (12/04/2022)  Social Connections: Moderately Integrated (05/12/2023)  Stress: No Stress Concern Present (12/04/2022)  Tobacco Use: Low Risk  (05/11/2023)  Health Literacy: Adequate Health Literacy (12/04/2022)     Readmission Risk Interventions     No data to display

## 2023-05-12 NOTE — Evaluation (Signed)
 Occupational Therapy Evaluation Patient Details Name: Kylie Duffy MRN: [id number] DOB: [birth year] Today's Date: 05/12/2023   History of Present Illness   68 y.o. female with transient episode of vision loss involving the top part of the visual fields of her left eye. PMHx of HLD, nuclear sclerotic cataract of right eye, macular hole OD s/p closure, vitrectomy OD (5/24), macular hole OS s/p repair 03/18/23 and osteopenia     Clinical Impressions PTA pt lives independently with her husband, drives and is retired. Pt states her vision has improved however it is not at her baseline. Makailey has a follow up appointment with her retinal specialist next week. Tammie describes a possible scotoma R eye. Continues to complain of distorted vision and difficulty with "clarity and glare". Educated on use of yellow lenses to increase contrast and Rose lenses to reduce glare. Pt given information regarding "[employer]" if she continues to have difficulty with her functional vision. Educated pt on warning signs/symptoms using BeFast. Pt verbalized understanding. No further OT needed.      If plan is discharge home, recommend the following:   Assist for transportation     Functional Status Assessment   Patient has not had a recent decline in their functional status     Equipment Recommendations   None recommended by OT     Recommendations for Other Services         Precautions/Restrictions   Precautions Precautions: None     Mobility Bed Mobility Overal bed mobility: Independent                  Transfers Overall transfer level: Independent                        Balance Overall balance assessment: No apparent balance deficits (not formally assessed)                                         ADL either performed or assessed with clinical judgement   ADL Overall ADL's : At baseline                                              Vision Baseline Vision/History: 6 Macular Degeneration;1 Wears glasses Ability to See in Adequate Light: 1 Impaired Patient Visual Report: Central vision impairment;Blurring of vision (Recent R eye surgery) Vision Assessment?: Yes Eye Alignment: Within Functional Limits Ocular Range of Motion: Within Functional Limits Alignment/Gaze Preference: Within Defined Limits Tracking/Visual Pursuits: Able to track stimulus in all quads without difficulty Saccades: Within functional limits Convergence: Within functional limits Visual Fields: No apparent deficits Additional Comments: Complains of near vision being more distorted than baseline; distant vision has improved however states she feels there is a "blank spot"     Perception         Praxis         Pertinent Vitals/Pain Pain Assessment Pain Assessment: No/denies pain     Extremity/Trunk Assessment Upper Extremity Assessment Upper Extremity Assessment: Overall WFL for tasks assessed   Lower Extremity Assessment Lower Extremity Assessment: Overall WFL for tasks assessed   Cervical / Trunk Assessment Cervical / Trunk Assessment: Normal   Communication Communication Communication: No apparent difficulties   Cognition Arousal: Alert Behavior During Therapy:  WFL for tasks assessed/performed Cognition: No apparent impairments                                       Cueing  General Comments          Exercises     Shoulder Instructions      Home Living Family/patient expects to be discharged to:: Private residence Living Arrangements: Spouse/significant other Available Help at Discharge: Family;Available 24 hours/day Type of Home: House Home Access: Stairs to enter [employer] of Steps: 7 Entrance Stairs-Rails: Left Home Layout: One level     Bathroom Shower/Tub: Producer, television/film/video: Standard Bathroom Accessibility: Yes   Home Equipment: Tree surgeon (2 wheels)          Prior Functioning/Environment Prior Level of Function : Independent/Modified Independent;Driving                    OT Problem List: Impaired vision/perception   OT Treatment/Interventions:        OT Goals(Current goals can be found in the care plan section)   Acute Rehab OT Goals Patient Stated Goal: for her vision to improve OT Goal Formulation: All assessment and education complete, DC therapy   OT Frequency:       Co-evaluation              AM-PAC OT "6 Clicks" Daily Activity     Outcome Measure Help from another person eating meals?: None Help from another person taking care of personal grooming?: None Help from another person toileting, which includes using toliet, bedpan, or urinal?: None Help from another person bathing (including washing, rinsing, drying)?: None Help from another person to put on and taking off regular upper body clothing?: None Help from another person to put on and taking off regular lower body clothing?: None 6 Click Score: 24   End of Session Nurse Communication: Mobility status  Activity Tolerance: Patient tolerated treatment well Patient left: in bed;with call bell/phone within reach  OT Visit Diagnosis: Low vision, both eyes (H54.2)                Time: 6578-4696 OT Time Calculation (min): 21 min Charges:  OT General Charges $OT Visit: 1 Visit OT Evaluation $OT Eval Low Complexity: 1 Low  [place] Aliment, OT/L   Acute OT Clinical Specialist Acute Rehabilitation Services Pager [phone] Office [phone]   [facility] 05/12/2023, 11:18 AM

## 2023-05-12 NOTE — Progress Notes (Signed)
 Patient wheeled off unit by NT. DC instructions reviewed with patient by virtual RN.

## 2023-05-12 NOTE — Discharge Summary (Signed)
 Physician Discharge Summary  Kylie Duffy EAV:[id number] DOB: 11-02-1955 DOA: 05/11/2023  PCP: Austine Lefort, MD  Admit date: 05/11/2023 Discharge date: 05/12/2023  Admitted From: Home Disposition: Home  Recommendations for Outpatient Follow-up:  Follow up with PCP in 1-2 weeks Follow-up with your ophthalmologist Will send referral to neurology for follow-up  Home Health: N/A Equipment/Devices: N/A  Discharge Condition: Stable CODE STATUS: Full code Diet recommendation: Low-salt diet  Discharge summary: [name] is a 68 y.o. female with history of hyperlipidemia,  nuclear sclerotic cataract of right eye, macular hole OD s/p closure, vitrectomy OD (5/24), macular hole OS s/p repair 03/18/23 and osteopenia who presented to the ED after a transient episode of vision loss involving the top part of the visual fields of her right eye. The episode lasted between 2-3 minutes and then resolved. She saw her Ophthalmologist Dr. Seward Dao in the morning where a dilated fundus examination was unremarkable. She was told that it may have been an "artery occlusion" and that she would need to be worked up for a stroke so came to ER.  Patient was without any neurological deficits on arrival to the ER.  Seen and examined by neurology.  Underwent following procedures and tests.  TIA/transient upper quadrant vision loss in a patient with history of macular hole s/p repair: Head CT, negative CT angiogram head and neck, normal MRI of the brain, normal Carotid ultrasound, without any obvious abnormalities or blockage 2D echocardiogram, normal ejection fraction.  Grade 1 diastolic dysfunction. LDL 148, hemoglobin A1c 5.5. Patient currently asymptomatic.  Seen by neurology.  Recommended to treat as TIA with following. -Dual antiplatelet therapy with aspirin and Plavix for 3 weeks then aspirin alone - Treat hyperlipidemia with Crestor 40 mg daily. Follow-up with ophthalmology as already  scheduled.   Stable for discharge.   Discharge Diagnoses:  Principal Problem:   TIA (transient ischemic attack) Active Problems:   Hyperlipidemia   Macular hole of left eye    Discharge Instructions  Discharge Instructions     Ambulatory referral to Neurology   Complete by: As directed    An appointment is requested in approximately: 4 weeks   Diet - low sodium heart healthy   Complete by: As directed    Increase activity slowly   Complete by: As directed       Allergies as of 05/12/2023   No Known Allergies      Medication List     STOP taking these medications    fluticasone 50 MCG/ACT nasal spray Commonly known as: FLONASE   ofloxacin 0.3 % ophthalmic solution Commonly known as: OCUFLOX   pantoprazole 40 MG tablet Commonly known as: PROTONIX   prednisoLONE acetate 1 % ophthalmic suspension Commonly known as: PRED FORTE       TAKE these medications    aspirin EC 81 MG tablet Take 1 tablet (81 mg total) by mouth daily. Swallow whole. Start taking on: May 13, 2023   b complex vitamins capsule Take 1 capsule by mouth daily.   bacitracin-polymyxin b ophthalmic ointment Commonly known as: POLYSPORIN Place 1 Application into the right eye at bedtime.   CALCIUM 600 + D PO Take 3 tablets by mouth daily. Calcifood   clopidogrel 75 MG tablet Commonly known as: PLAVIX Take 1 tablet (75 mg total) by mouth daily for 21 days. Start taking on: May 13, 2023   Fish Oil 1200 MG Caps Take 1,200 mg by mouth daily.   ibuprofen 600 MG tablet Commonly  known as: ADVIL Take 1 tablet (600 mg total) by mouth every 8 (eight) hours as needed. What changed:  when to take this reasons to take this   multivitamin with minerals Tabs tablet Take 1 tablet by mouth daily.   OVER THE COUNTER MEDICATION Take 1 each by mouth daily. Cyruta   OVER THE COUNTER MEDICATION Take 1 tablet by mouth daily. Cholaplex   rosuvastatin 40 MG tablet Commonly known as:  CRESTOR Take 1 tablet (40 mg total) by mouth daily. Start taking on: May 13, 2023   SUMAtriptan 100 MG tablet Commonly known as: Imitrex Take 1 tablet (100 mg total) by mouth every 2 (two) hours as needed for migraine or headache. May repeat in 2 hours if needed.   Vitamin D (Cholecalciferol) 25 MCG (1000 UT) Tabs Take 1,000 Units by mouth 2 (two) times daily.        No Known Allergies  Consultations: Neurology   Procedures/Studies: VAS US CAROTID Result Date: 05/12/2023 Carotid Arterial Duplex Study Patient Name:  Kylie Duffy  Date of Exam:   05/12/2023 Medical Rec #: [id number]      Accession #:    [id number] Date of Birth: 1955-09-09      Patient Gender: F Patient Age:   68 years Exam Location:  [facility] Procedure:      VAS US CAROTID Referring Phys: ERIC LINDZEN --------------------------------------------------------------------------------  Indications:       Visual disturbance. Risk Factors:      Hyperlipidemia. Other Factors:     Sudden loss of vision, H/O cataracts surgery. Comparison Study:  No prior exam. Performing Technologist: Fernande Bras  Examination Guidelines: A complete evaluation includes B-mode imaging, spectral Doppler, color Doppler, and power Doppler as needed of all accessible portions of each vessel. Bilateral testing is considered an integral part of a complete examination. Limited examinations for reoccurring indications may be performed as noted.  Right Carotid Findings: +----------+--------+--------+--------+------------------+--------+           PSV cm/sEDV cm/sStenosisPlaque DescriptionComments +----------+--------+--------+--------+------------------+--------+ CCA Prox  101     29                                         +----------+--------+--------+--------+------------------+--------+ CCA Distal88      29                                         +----------+--------+--------+--------+------------------+--------+ ICA  Prox  68      22                                         +----------+--------+--------+--------+------------------+--------+ ICA Mid   61      30                                         +----------+--------+--------+--------+------------------+--------+ ICA Distal65      27                                         +----------+--------+--------+--------+------------------+--------+ ECA  60      16                                         +----------+--------+--------+--------+------------------+--------+ +----------+--------+-------+--------+-------------------+           PSV cm/sEDV cmsDescribeArm Pressure (mmHG) +----------+--------+-------+--------+-------------------+ Subclavian129                                        +----------+--------+-------+--------+-------------------+ +---------+--------+--+--------+--+ VertebralPSV cm/s45EDV cm/s18 +---------+--------+--+--------+--+  Left Carotid Findings: +----------+--------+--------+--------+------------------+--------+           PSV cm/sEDV cm/sStenosisPlaque DescriptionComments +----------+--------+--------+--------+------------------+--------+ CCA Prox  102     32                                         +----------+--------+--------+--------+------------------+--------+ CCA Distal89      32                                         +----------+--------+--------+--------+------------------+--------+ ICA Prox  60      24                                         +----------+--------+--------+--------+------------------+--------+ ICA Mid   87      38                                         +----------+--------+--------+--------+------------------+--------+ ICA Distal71      32                                         +----------+--------+--------+--------+------------------+--------+ ECA       104     24                                          +----------+--------+--------+--------+------------------+--------+ +----------+--------+--------+--------+-------------------+           PSV cm/sEDV cm/sDescribeArm Pressure (mmHG) +----------+--------+--------+--------+-------------------+ [id number]                                         +----------+--------+--------+--------+-------------------+ +---------+--------+--+--------+--+ VertebralPSV cm/s52EDV cm/s19 +---------+--------+--+--------+--+   Summary: Right Carotid: The extracranial vessels were near-normal with only minimal wall                thickening or plaque. Left Carotid: The extracranial vessels were near-normal with only minimal wall               thickening or plaque. Vertebrals:  Bilateral vertebral arteries demonstrate antegrade flow. Subclavians: Normal flow hemodynamics were seen in bilateral subclavian              arteries. *See table(s) above for measurements and observations.  Preliminary    CT ANGIO HEAD NECK W WO CM Result Date: 05/11/2023 CLINICAL DATA:  TIA EXAM: CT ANGIOGRAPHY HEAD AND NECK WITH AND WITHOUT CONTRAST TECHNIQUE: Multidetector CT imaging of the head and neck was performed using the standard protocol during bolus administration of intravenous contrast. Multiplanar CT image reconstructions and MIPs were obtained to evaluate the vascular anatomy. Carotid stenosis measurements (when applicable) are obtained utilizing NASCET criteria, using the distal internal carotid diameter as the denominator. RADIATION DOSE REDUCTION: This exam was performed according to the departmental dose-optimization program which includes automated exposure control, adjustment of the mA and/or kV according to patient size and/or use of iterative reconstruction technique. CONTRAST:  75mL OMNIPAQUE IOHEXOL 350 MG/ML SOLN COMPARISON:  Same day CT head. FINDINGS: CTA NECK FINDINGS Aortic arch: Great vessel origins are patent without significant stenosis. Right carotid  system: No evidence of dissection, stenosis (50% or greater), or occlusion. Left carotid system: No evidence of dissection, stenosis (50% or greater), or occlusion. Vertebral arteries: Codominant. No evidence of dissection, stenosis (50% or greater), or occlusion. Skeleton: No evidence of acute abnormality on limited assessment. Multilevel degenerative change in the cervical spine. Other neck: No acute abnormality on limited assessment. Upper chest: The visualized lung apices are clear. Review of the MIP images confirms the above findings CTA HEAD FINDINGS Anterior circulation: Bilateral intracranial ICAs, MCAs, and ACAs are patent without proximal hemodynamically significant stenosis. Small right supraclinoid outpouching has a vessel arising from its tip and therefore is compatible with an infundibulum. Posterior circulation: Bilateral intradural vertebral arteries, basilar artery and bilateral postrenal arteries are patent without proximal hemodynamically significant stenosis. Venous sinuses: As permitted by contrast timing, patent. Review of the MIP images confirms the above findings IMPRESSION: No large vessel occlusion or proximal hemodynamically significant stenosis. Electronically Signed   By: Feliberto Harts M.D.   On: 05/11/2023 21:17   MR BRAIN W WO CONTRAST Result Date: 05/11/2023 CLINICAL DATA:  TIA EXAM: MRI HEAD WITHOUT AND WITH CONTRAST TECHNIQUE: Multiplanar, multiecho pulse sequences of the brain and surrounding structures were obtained without and with intravenous contrast. CONTRAST:  5.35mL GADAVIST GADOBUTROL 1 MMOL/ML IV SOLN COMPARISON:  CT head from today.  MRI head November 11, 2016. FINDINGS: Brain: No acute infarction, hemorrhage, hydrocephalus, extra-axial collection or mass lesion. Similar mild prominence of the pituitary. No pathologic enhancement. Vascular: Normal flow voids. Skull and upper cervical spine: Normal marrow signal. Sinuses/Orbits: Clear sinuses.  No acute orbital  findings. Other: No mastoid effusions. IMPRESSION: No evidence of acute intracranial abnormality. Electronically Signed   By: Feliberto Harts M.D.   On: 05/11/2023 20:33   ECHOCARDIOGRAM COMPLETE Result Date: 05/11/2023    ECHOCARDIOGRAM REPORT   Patient Name:   Kylie Duffy Date of Exam: 05/11/2023 Medical Rec #:  [id number]     Height:       62.0 in Accession #:    [id number]    Weight:       125.0 lb Date of Birth:  1955-04-18     BSA:          1.566 m Patient Age:    67 years      BP:           135/92 mmHg Patient Gender: F             HR:           83 bpm. Exam Location:  Inpatient Procedure: 2D Echo (Both Spectral and Color Flow Doppler were utilized during  procedure). Indications:    TIA  History:        Patient has no prior history of Echocardiogram examinations.                 Risk Factors:Dyslipidemia.  Sonographer:    Dione Franks RDCS Referring Phys: [id number] Skylynn Burkley IMPRESSIONS  1. Left ventricular ejection fraction, by estimation, is 60 to 65%. The left ventricle has normal function. The left ventricle has no regional wall motion abnormalities. Left ventricular diastolic parameters are consistent with Grade I diastolic dysfunction (impaired relaxation).  2. Right ventricular systolic function is normal. The right ventricular size is normal. There is normal pulmonary artery systolic pressure.  3. The mitral valve is normal in structure. Mild mitral valve regurgitation. No evidence of mitral stenosis.  4. The aortic valve is tricuspid. There is mild calcification of the aortic valve. Aortic valve regurgitation is mild. No aortic stenosis is present.  5. The inferior vena cava is normal in size with greater than 50% respiratory variability, suggesting right atrial pressure of 3 mmHg. FINDINGS  Left Ventricle: Left ventricular ejection fraction, by estimation, is 60 to 65%. The left ventricle has normal function. The left ventricle has no regional wall motion abnormalities. The  left ventricular internal cavity size was normal in size. There is  no left ventricular hypertrophy. Left ventricular diastolic parameters are consistent with Grade I diastolic dysfunction (impaired relaxation). Right Ventricle: The right ventricular size is normal. No increase in right ventricular wall thickness. Right ventricular systolic function is normal. There is normal pulmonary artery systolic pressure. The tricuspid regurgitant velocity is 2.08 m/s, and  with an assumed right atrial pressure of 3 mmHg, the estimated right ventricular systolic pressure is 20.3 mmHg. Left Atrium: Left atrial size was normal in size. Right Atrium: Right atrial size was normal in size. Pericardium: There is no evidence of pericardial effusion. Mitral Valve: The mitral valve is normal in structure. Mild mitral valve regurgitation. No evidence of mitral valve stenosis. Tricuspid Valve: The tricuspid valve is normal in structure. Tricuspid valve regurgitation is trivial. No evidence of tricuspid stenosis. Aortic Valve: The aortic valve is tricuspid. There is mild calcification of the aortic valve. Aortic valve regurgitation is mild. Aortic regurgitation PHT measures 400 msec. No aortic stenosis is present. Pulmonic Valve: The pulmonic valve was normal in structure. Pulmonic valve regurgitation is trivial. No evidence of pulmonic stenosis. Aorta: The aortic root is normal in size and structure. Venous: The inferior vena cava is normal in size with greater than 50% respiratory variability, suggesting right atrial pressure of 3 mmHg. IAS/Shunts: No atrial level shunt detected by color flow Doppler.  LEFT VENTRICLE PLAX 2D LVIDd:         4.60 cm   Diastology LVIDs:         3.00 cm   LV e' medial:    6.53 cm/s LV PW:         0.80 cm   LV E/e' medial:  9.4 LV IVS:        0.60 cm   LV e' lateral:   9.25 cm/s LVOT diam:     1.70 cm   LV E/e' lateral: 6.7 LV SV:         39 LV SV Index:   25 LVOT Area:     2.27 cm  RIGHT VENTRICLE              IVC RV Basal diam:  2.40 cm     IVC diam:  1.40 cm RV S prime:     14.90 cm/s TAPSE (M-mode): 2.2 cm LEFT ATRIUM             Index        RIGHT ATRIUM          Index LA diam:        3.10 cm 1.98 cm/m   RA Area:     8.34 cm LA Vol (A2C):   29.0 ml 18.52 ml/m  RA Volume:   15.90 ml 10.16 ml/m LA Vol (A4C):   27.5 ml 17.57 ml/m LA Biplane Vol: 28.0 ml 17.89 ml/m  AORTIC VALVE LVOT Vmax:   104.00 cm/s LVOT Vmean:  69.700 cm/s LVOT VTI:    0.171 m AI PHT:      400 msec  AORTA Ao Root diam: 2.40 cm MITRAL VALVE               TRICUSPID VALVE MV Area (PHT): 4.80 cm    TR Peak grad:   17.3 mmHg MV Decel Time: 158 msec    TR Vmax:        208.00 cm/s MV E velocity: 61.60 cm/s MV A velocity: 69.70 cm/s  SHUNTS MV E/A ratio:  0.88        Systemic VTI:  0.17 m                            Systemic Diam: 1.70 cm Arvilla Meres MD Electronically signed by Arvilla Meres MD Signature Date/Time: 05/11/2023/6:37:30 PM    Final    CT HEAD WO CONTRAST Result Date: 05/11/2023 CLINICAL DATA:  Vision loss, monocular. EXAM: CT HEAD WITHOUT CONTRAST TECHNIQUE: Contiguous axial images were obtained from the base of the skull through the vertex without intravenous contrast. RADIATION DOSE REDUCTION: This exam was performed according to the departmental dose-optimization program which includes automated exposure control, adjustment of the mA and/or kV according to patient size and/or use of iterative reconstruction technique. COMPARISON:  Head MRI 11/11/2016 FINDINGS: Brain: There is no evidence of an acute infarct, intracranial hemorrhage, mass, midline shift, or extra-axial fluid collection. Cerebral volume is normal. The ventricles are normal in size. The pituitary remains prominent in size for age, similar to the prior MRI and not evaluated in detail on today's CT although without evidence of mass effect on the optic chiasm. Vascular: No hyperdense vessel. Skull: No acute fracture or suspicious lesion. Sinuses/Orbits:  Visualized paranasal sinuses and mastoid air cells are clear. Postoperative changes to the globes. Other: None. IMPRESSION: No evidence of acute intracranial abnormality. Electronically Signed   By: Sebastian Ache M.D.   On: 05/11/2023 16:23   (Echo, Carotid, EGD, Colonoscopy, ERCP)    Subjective: Seen and examined.  Walking around in the hallway.  No complaints.   Discharge Exam: Vitals:   05/12/23 1203 05/12/23 1547  BP: (!) 158/83 126/77  Pulse: 95 77  Resp: 18 18  Temp: 97.8 F (36.6 C) (!) 97.5 F (36.4 C)  SpO2: 100% 96%   Vitals:   05/12/23 0335 05/12/23 1157 05/12/23 1203 05/12/23 1547  BP: 98/61 106/65 (!) 158/83 126/77  Pulse: 74 83 95 77  Resp:  18 18 18   Temp: 97.9 F (36.6 C) 98 F (36.7 C) 97.8 F (36.6 C) (!) 97.5 F (36.4 C)  TempSrc: Oral Oral Oral Oral  SpO2: 96% 94% 100% 96%  Weight:      Height:  General: Pt is alert, awake, not in acute distress Cardiovascular: RRR, S1/S2 +, no rubs, no gallops Respiratory: CTA bilaterally, no wheezing, no rhonchi Abdominal: Soft, NT, ND, bowel sounds + Extremities: no edema, no cyanosis    The results of significant diagnostics from this hospitalization (including imaging, microbiology, ancillary and laboratory) are listed below for reference.     Microbiology: No results found for this or any previous visit (from the past 240 hours).   Labs: BNP (last 3 results) No results for input(s): "BNP" in the last 8760 hours. Basic Metabolic Panel: Recent Labs  Lab 05/11/23 1146 05/11/23 1202  NA 140 139  K 3.9 3.9  CL 103 103  CO2 25  --   GLUCOSE 104* 104*  BUN 14 16  CREATININE 0.73 0.80  CALCIUM 9.7  --    Liver Function Tests: Recent Labs  Lab 05/11/23 1146  AST 21  ALT 19  ALKPHOS 60  BILITOT 0.6  PROT 7.6  ALBUMIN 4.4   No results for input(s): "LIPASE", "AMYLASE" in the last 168 hours. No results for input(s): "AMMONIA" in the last 168 hours. CBC: Recent Labs  Lab 05/11/23 1146  05/11/23 1202  WBC 9.7  --   NEUTROABS 7.1  --   HGB 15.4* 16.3*  HCT 48.1* 48.0*  MCV 91.6  --   PLT 733*  --    Cardiac Enzymes: No results for input(s): "CKTOTAL", "CKMB", "CKMBINDEX", "TROPONINI" in the last 168 hours. BNP: Invalid input(s): "POCBNP" CBG: Recent Labs  Lab 05/11/23 1157  GLUCAP 114*   D-Dimer No results for input(s): "DDIMER" in the last 72 hours. Hgb A1c Recent Labs    05/11/23 1857  HGBA1C 5.5   Lipid Profile Recent Labs    05/11/23 1857  CHOL 227*  HDL 67  LDLCALC 148*  TRIG 61  CHOLHDL 3.4   Thyroid function studies No results for input(s): "TSH", "T4TOTAL", "T3FREE", "THYROIDAB" in the last 72 hours.  Invalid input(s): "FREET3" Anemia work up No results for input(s): "VITAMINB12", "FOLATE", "FERRITIN", "TIBC", "IRON", "RETICCTPCT" in the last 72 hours. Urinalysis    Component Value Date/Time   COLORURINE YELLOW 12/26/2016 1547   APPEARANCEUR Clear 06/21/2020 1400   LABSPEC 1.015 12/26/2016 1547   PHURINE 7.0 12/26/2016 1547   GLUCOSEU Negative 06/21/2020 1400   GLUCOSEU NEGATIVE 12/26/2016 1547   HGBUR NEGATIVE 12/26/2016 1547   BILIRUBINUR Negative 06/21/2020 1400   KETONESUR NEGATIVE 12/26/2016 1547   PROTEINUR Negative 06/21/2020 1400   UROBILINOGEN 0.2 12/26/2016 1547   NITRITE Negative 06/21/2020 1400   NITRITE NEGATIVE 12/26/2016 1547   LEUKOCYTESUR 1+ (A) 06/21/2020 1400   Sepsis Labs Recent Labs  Lab 05/11/23 1146  WBC 9.7   Microbiology No results found for this or any previous visit (from the past 240 hours).   Time coordinating discharge: 32 minutes  SIGNED:   Vada Garibaldi, MD  [facility] 05/12/2023, 4:10 PM

## 2023-05-12 NOTE — Care Management Obs Status (Signed)
 MEDICARE OBSERVATION STATUS NOTIFICATION   Patient Details  Name: Kylie Duffy MRN: [id number] Date of Birth: 1955/10/06   Medicare Observation Status Notification Given:  Yes  Obs/Moon letter signed and a copy given  Wynonia Hedges 05/12/2023, 10:39 AM

## 2023-05-12 NOTE — Progress Notes (Signed)
 PT Cancellation Note  Patient Details Name: Kylie Duffy MRN: [id number] DOB: [birth year]   Cancelled Treatment:    Reason Eval/Treat Not Completed: PT screened, no needs identified, will sign off. Pt at baseline. She is independent with no mobility or balance deficits.   [name] 05/12/2023, 8:55 AM

## 2023-05-12 NOTE — Progress Notes (Addendum)
 STROKE TEAM PROGRESS NOTE   INTERIM HISTORY/SUBJECTIVE  States that on Sunday she had 2 to 3 minutes of vision loss in her right upper quadrant right eye.  She went to her ophthalmologist and was referred to the emergency room  MRI negative for acute process CTA negative Check bilateral carotid ultrasound  Will start aspirin 81 mg and Plavix 75 mg for 3 weeks then aspirin alone and Crestor 40 mg  OBJECTIVE  CBC    Component Value Date/Time   WBC 9.7 05/11/2023 1146   RBC 5.25 (H) 05/11/2023 1146   HGB 16.3 (H) 05/11/2023 1202   HCT 48.0 (H) 05/11/2023 1202   PLT 733 (H) 05/11/2023 1146   MCV 91.6 05/11/2023 1146   MCH 29.3 05/11/2023 1146   MCHC 32.0 05/11/2023 1146   RDW 14.5 05/11/2023 1146   LYMPHSABS 1.9 05/11/2023 1146   MONOABS 0.4 05/11/2023 1146   EOSABS 0.1 05/11/2023 1146   BASOSABS 0.1 05/11/2023 1146    BMET    Component Value Date/Time   NA 139 05/11/2023 1202   K 3.9 05/11/2023 1202   CL 103 05/11/2023 1202   CO2 25 05/11/2023 1146   GLUCOSE 104 (H) 05/11/2023 1202   BUN 16 05/11/2023 1202   CREATININE 0.80 05/11/2023 1202   CREATININE 0.74 07/28/2022 1000   CALCIUM 9.7 05/11/2023 1146   EGFR 89 07/28/2022 1000   GFRNONAA >60 05/11/2023 1146   GFRNONAA 77 07/13/2020 0840    IMAGING past 24 hours CT ANGIO HEAD NECK W WO CM Result Date: 05/11/2023 CLINICAL DATA:  TIA EXAM: CT ANGIOGRAPHY HEAD AND NECK WITH AND WITHOUT CONTRAST TECHNIQUE: Multidetector CT imaging of the head and neck was performed using the standard protocol during bolus administration of intravenous contrast. Multiplanar CT image reconstructions and MIPs were obtained to evaluate the vascular anatomy. Carotid stenosis measurements (when applicable) are obtained utilizing NASCET criteria, using the distal internal carotid diameter as the denominator. RADIATION DOSE REDUCTION: This exam was performed according to the departmental dose-optimization program which includes automated exposure  control, adjustment of the mA and/or kV according to patient size and/or use of iterative reconstruction technique. CONTRAST:  75mL OMNIPAQUE IOHEXOL 350 MG/ML SOLN COMPARISON:  Same day CT head. FINDINGS: CTA NECK FINDINGS Aortic arch: Great vessel origins are patent without significant stenosis. Right carotid system: No evidence of dissection, stenosis (50% or greater), or occlusion. Left carotid system: No evidence of dissection, stenosis (50% or greater), or occlusion. Vertebral arteries: Codominant. No evidence of dissection, stenosis (50% or greater), or occlusion. Skeleton: No evidence of acute abnormality on limited assessment. Multilevel degenerative change in the cervical spine. Other neck: No acute abnormality on limited assessment. Upper chest: The visualized lung apices are clear. Review of the MIP images confirms the above findings CTA HEAD FINDINGS Anterior circulation: Bilateral intracranial ICAs, MCAs, and ACAs are patent without proximal hemodynamically significant stenosis. Small right supraclinoid outpouching has a vessel arising from its tip and therefore is compatible with an infundibulum. Posterior circulation: Bilateral intradural vertebral arteries, basilar artery and bilateral postrenal arteries are patent without proximal hemodynamically significant stenosis. Venous sinuses: As permitted by contrast timing, patent. Review of the MIP images confirms the above findings IMPRESSION: No large vessel occlusion or proximal hemodynamically significant stenosis. Electronically Signed   By: Stevenson Elbe M.D.   On: 05/11/2023 21:17   MR BRAIN W WO CONTRAST Result Date: 05/11/2023 CLINICAL DATA:  TIA EXAM: MRI HEAD WITHOUT AND WITH CONTRAST TECHNIQUE: Multiplanar, multiecho pulse sequences of the  brain and surrounding structures were obtained without and with intravenous contrast. CONTRAST:  5.5mL GADAVIST GADOBUTROL 1 MMOL/ML IV SOLN COMPARISON:  CT head from today.  MRI head November 11, 2016. FINDINGS: Brain: No acute infarction, hemorrhage, hydrocephalus, extra-axial collection or mass lesion. Similar mild prominence of the pituitary. No pathologic enhancement. Vascular: Normal flow voids. Skull and upper cervical spine: Normal marrow signal. Sinuses/Orbits: Clear sinuses.  No acute orbital findings. Other: No mastoid effusions. IMPRESSION: No evidence of acute intracranial abnormality. Electronically Signed   By: Stevenson Elbe M.D.   On: 05/11/2023 20:33   ECHOCARDIOGRAM COMPLETE Result Date: 05/11/2023    ECHOCARDIOGRAM REPORT   Patient Name:   Kylie Duffy Date of Exam: 05/11/2023 Medical Rec #:  [id number]     Height:       62.0 in Accession #:    [id number]    Weight:       125.0 lb Date of Birth:  [birth year]     BSA:          1.566 m Patient Age:    67 years      BP:           135/92 mmHg Patient Gender: F             HR:           83 bpm. Exam Location:  Inpatient Procedure: 2D Echo (Both Spectral and Color Flow Doppler were utilized during            procedure). Indications:    TIA  History:        Patient has no prior history of Echocardiogram examinations.                 Risk Factors:Dyslipidemia.  Sonographer:    Dione Franks RDCS Referring Phys: [id number] KUBER GHIMIRE IMPRESSIONS  1. Left ventricular ejection fraction, by estimation, is 60 to 65%. The left ventricle has normal function. The left ventricle has no regional wall motion abnormalities. Left ventricular diastolic parameters are consistent with Grade I diastolic dysfunction (impaired relaxation).  2. Right ventricular systolic function is normal. The right ventricular size is normal. There is normal pulmonary artery systolic pressure.  3. The mitral valve is normal in structure. Mild mitral valve regurgitation. No evidence of mitral stenosis.  4. The aortic valve is tricuspid. There is mild calcification of the aortic valve. Aortic valve regurgitation is mild. No aortic stenosis is present.  5. The inferior vena  cava is normal in size with greater than 50% respiratory variability, suggesting right atrial pressure of 3 mmHg. FINDINGS  Left Ventricle: Left ventricular ejection fraction, by estimation, is 60 to 65%. The left ventricle has normal function. The left ventricle has no regional wall motion abnormalities. The left ventricular internal cavity size was normal in size. There is  no left ventricular hypertrophy. Left ventricular diastolic parameters are consistent with Grade I diastolic dysfunction (impaired relaxation). Right Ventricle: The right ventricular size is normal. No increase in right ventricular wall thickness. Right ventricular systolic function is normal. There is normal pulmonary artery systolic pressure. The tricuspid regurgitant velocity is 2.08 m/s, and  with an assumed right atrial pressure of 3 mmHg, the estimated right ventricular systolic pressure is 20.3 mmHg. Left Atrium: Left atrial size was normal in size. Right Atrium: Right atrial size was normal in size. Pericardium: There is no evidence of pericardial effusion. Mitral Valve: The mitral valve is normal in structure. Mild mitral valve regurgitation. No evidence of  mitral valve stenosis. Tricuspid Valve: The tricuspid valve is normal in structure. Tricuspid valve regurgitation is trivial. No evidence of tricuspid stenosis. Aortic Valve: The aortic valve is tricuspid. There is mild calcification of the aortic valve. Aortic valve regurgitation is mild. Aortic regurgitation PHT measures 400 msec. No aortic stenosis is present. Pulmonic Valve: The pulmonic valve was normal in structure. Pulmonic valve regurgitation is trivial. No evidence of pulmonic stenosis. Aorta: The aortic root is normal in size and structure. Venous: The inferior vena cava is normal in size with greater than 50% respiratory variability, suggesting right atrial pressure of 3 mmHg. IAS/Shunts: No atrial level shunt detected by color flow Doppler.  LEFT VENTRICLE PLAX 2D LVIDd:          4.60 cm   Diastology LVIDs:         3.00 cm   LV e' medial:    6.53 cm/s LV PW:         0.80 cm   LV E/e' medial:  9.4 LV IVS:        0.60 cm   LV e' lateral:   9.25 cm/s LVOT diam:     1.70 cm   LV E/e' lateral: 6.7 LV SV:         39 LV SV Index:   25 LVOT Area:     2.27 cm  RIGHT VENTRICLE             IVC RV Basal diam:  2.40 cm     IVC diam: 1.40 cm RV S prime:     14.90 cm/s TAPSE (M-mode): 2.2 cm LEFT ATRIUM             Index        RIGHT ATRIUM          Index LA diam:        3.10 cm 1.98 cm/m   RA Area:     8.34 cm LA Vol (A2C):   29.0 ml 18.52 ml/m  RA Volume:   15.90 ml 10.16 ml/m LA Vol (A4C):   27.5 ml 17.57 ml/m LA Biplane Vol: 28.0 ml 17.89 ml/m  AORTIC VALVE LVOT Vmax:   104.00 cm/s LVOT Vmean:  69.700 cm/s LVOT VTI:    0.171 m AI PHT:      400 msec  AORTA Ao Root diam: 2.40 cm MITRAL VALVE               TRICUSPID VALVE MV Area (PHT): 4.80 cm    TR Peak grad:   17.3 mmHg MV Decel Time: 158 msec    TR Vmax:        208.00 cm/s MV E velocity: 61.60 cm/s MV A velocity: 69.70 cm/s  SHUNTS MV E/A ratio:  0.88        Systemic VTI:  0.17 m                            Systemic Diam: 1.70 cm Jules Oar MD Electronically signed by Jules Oar MD Signature Date/Time: 05/11/2023/6:37:30 PM    Final    CT HEAD WO CONTRAST Result Date: 05/11/2023 CLINICAL DATA:  Vision loss, monocular. EXAM: CT HEAD WITHOUT CONTRAST TECHNIQUE: Contiguous axial images were obtained from the base of the skull through the vertex without intravenous contrast. RADIATION DOSE REDUCTION: This exam was performed according to the departmental dose-optimization program which includes automated exposure control, adjustment of the mA and/or kV according to patient size  and/or use of iterative reconstruction technique. COMPARISON:  Head MRI 11/11/2016 FINDINGS: Brain: There is no evidence of an acute infarct, intracranial hemorrhage, mass, midline shift, or extra-axial fluid collection. Cerebral volume is normal. The  ventricles are normal in size. The pituitary remains prominent in size for age, similar to the prior MRI and not evaluated in detail on today's CT although without evidence of mass effect on the optic chiasm. Vascular: No hyperdense vessel. Skull: No acute fracture or suspicious lesion. Sinuses/Orbits: Visualized paranasal sinuses and mastoid air cells are clear. Postoperative changes to the globes. Other: None. IMPRESSION: No evidence of acute intracranial abnormality. Electronically Signed   By: Aundra Lee M.D.   On: 05/11/2023 16:23    Vitals:   05/11/23 2351 05/12/23 0335 05/12/23 1157 05/12/23 1203  BP:  98/61 106/65 (!) 158/83  Pulse:  74 83 95  Resp:   18 18  Temp: 98.3 F (36.8 C) 97.9 F (36.6 C) 98 F (36.7 C) 97.8 F (36.6 C)  TempSrc: Oral Oral Oral Oral  SpO2:  96% 94% 100%  Weight:      Height:         PHYSICAL EXAM General:  Alert, well-nourished, well-developed patient in no acute distress Psych:  Mood and affect appropriate for situation CV: Regular rate and rhythm on monitor Respiratory:  Regular, unlabored respirations on room air GI: Abdomen soft and nontender   NEURO:  Mental Status: AA&Ox3, patient is able to give clear and coherent history Speech/Language: speech is without dysarthria or aphasia.  Naming, repetition, fluency, and comprehension intact.  Cranial Nerves:  II: PERRL. Visual fields full.  III, IV, VI: EOMI. Eyelids elevate symmetrically.  V: Sensation is intact to light touch and symmetrical to face.  VII: Face is symmetrical resting and smiling VIII: hearing intact to voice. IX, X: Palate elevates symmetrically. Phonation is normal.  MV:HQIONGEX shrug 5/5. XII: tongue is midline without fasciculations. Motor: 5/5 strength to all muscle groups tested.  Tone: is normal and bulk is normal Sensation- Intact to light touch bilaterally. Extinction absent to light touch to DSS.   Coordination: FTN intact bilaterally, HKS: no ataxia in BLE.No  drift.  Gait- deferred  Most Recent NIH 0   ASSESSMENT/PLAN  [name] is a 68 y.o. female with history of hyperlipidemia,  nuclear sclerotic cataract of right eye, macular hole OD s/p closure, vitrectomy OD (5/24), macular hole OS s/p repair 03/18/23 and osteopenia who presented to the ED this morning after a transient episode of vision loss involving the top part of the visual fields of her left eye. The episode lasted between 2-3 minutes and then resolved. She saw her Ophthalmologist Dr. Seward Dao this morning where a dilated fundus examination was unremarkable. She was told that it may have been an "artery occlusion" and that she would need to be worked up for a stroke   Left BRAO   Ophthalmology reported seeing Hollenhorst plaque CT head No acute abnormality.  CTA head & neck negative MRI negative Carotid Doppler unremarkable 2D Echo EF 60 to 65%.  LV with grade 1 diastolic dysfunction LDL 148 HgbA1c 5.5 ESR and CRP neg Recommend following up with ophthalmology VTE prophylaxis -Lovenox No antithrombotic prior to admission, now on aspirin 81 mg daily and clopidogrel 75 mg daily for 3 weeks and then aspirin alone. Therapy recommendations:  none  Disposition: Pending  Hypertension Home meds: None Stable Long term BP goal normotensive  Hyperlipidemia Home meds: None LDL 148, goal < 70  Add Crestor 40 mg Continue statin at discharge  Other Stroke Risk Factors ETOH use, alcohol level <10, advised to drink no more than 1 drink(s) a day  Other Active Problems B/l eye macular hole s/p repair, R more recent at 2 months ago  Hospital day # 0   Jonette Nestle DNP, ACNPC-AG  [facility]  ATTENDING NOTE: I reviewed above note and agree with the assessment and plan. Pt was seen and examined.   No family at the bedside. Pt came out from bathroom and walking in the room without difficulty. Neuro exam intact. Left eyes upper visual field vision loss transient and  resolved. Stroke work up negative. Pt probably has BRAO with local pathology. Will continue DAPT for 3 weeks and then ASA alone. LDL high, put on high intensity statin. Follow up with ophthalmology.   For detailed assessment and plan, please refer to above as I have made changes wherever appropriate.   Neurology will sign off. Please call with questions. No neuro follow up needed at this time. Thanks for the consult.   Consuelo Denmark, MD PhD Stroke Neurology 05/12/2023 10:28 PM      To contact Stroke Continuity provider, please refer to [email or web address]. After hours, contact General Neurology

## 2023-05-20 DIAGNOSIS — H34231 Retinal artery branch occlusion, right eye: Secondary | ICD-10-CM | POA: Diagnosis not present

## 2023-05-20 DIAGNOSIS — H353122 Nonexudative age-related macular degeneration, left eye, intermediate dry stage: Secondary | ICD-10-CM | POA: Diagnosis not present

## 2023-05-20 DIAGNOSIS — S0501XD Injury of conjunctiva and corneal abrasion without foreign body, right eye, subsequent encounter: Secondary | ICD-10-CM | POA: Diagnosis not present

## 2023-05-20 DIAGNOSIS — H35353 Cystoid macular degeneration, bilateral: Secondary | ICD-10-CM | POA: Diagnosis not present

## 2023-05-20 DIAGNOSIS — H353113 Nonexudative age-related macular degeneration, right eye, advanced atrophic without subfoveal involvement: Secondary | ICD-10-CM | POA: Diagnosis not present

## 2023-05-25 ENCOUNTER — Telehealth: Payer: Self-pay

## 2023-05-25 NOTE — Telephone Encounter (Signed)
 Copied from CRM [id number]. Topic: General - Other >> May 25, 2023  1:35 PM Oddis Bench wrote: Reason for CRM: Patient is calling to get information on the reason for the call she missed, informed at lunch and will call back when done.

## 2023-05-29 ENCOUNTER — Ambulatory Visit (INDEPENDENT_AMBULATORY_CARE_PROVIDER_SITE_OTHER): Admitting: Family Medicine

## 2023-05-29 ENCOUNTER — Encounter: Payer: Self-pay | Admitting: Family Medicine

## 2023-05-29 VITALS — BP 114/62 | HR 88 | Temp 98.3°F | Ht 62.0 in | Wt 122.8 lb

## 2023-05-29 DIAGNOSIS — G459 Transient cerebral ischemic attack, unspecified: Secondary | ICD-10-CM | POA: Diagnosis not present

## 2023-05-29 DIAGNOSIS — E785 Hyperlipidemia, unspecified: Secondary | ICD-10-CM | POA: Diagnosis not present

## 2023-05-29 NOTE — Progress Notes (Signed)
 Subjective:    Patient ID: Kylie Duffy, female    DOB: [birth year], 68 y.o.   MRN: [id number]  Admit date: 05/11/2023 Discharge date: 05/12/2023   Admitted From: Home Disposition: Home   Recommendations for Outpatient Follow-up:  Follow up with PCP in 1-2 weeks Follow-up with your ophthalmologist Will send referral to neurology for follow-up   Home Health: N/A Equipment/Devices: N/A   Discharge Condition: Stable CODE STATUS: Full code Diet recommendation: Low-salt diet   Discharge summary: Kylie Duffy is a 68 y.o. female with history of hyperlipidemia,  nuclear sclerotic cataract of right eye, macular hole OD s/p closure, vitrectomy OD (5/24), macular hole OS s/p repair 03/18/23 and osteopenia who presented to the ED after a transient episode of vision loss involving the top part of the visual fields of her right eye. The episode lasted between 2-3 minutes and then resolved. She saw her Ophthalmologist Dr. Seward Dao in the morning where a dilated fundus examination was unremarkable. She was told that it may have been an "artery occlusion" and that she would need to be worked up for a stroke so came to ER.  Patient was without any neurological deficits on arrival to the ER.  Seen and examined by neurology.  Underwent following procedures and tests.   TIA/transient upper quadrant vision loss in a patient with history of macular hole s/p repair: Head CT, negative CT angiogram head and neck, normal MRI of the brain, normal Carotid ultrasound, without any obvious abnormalities or blockage 2D echocardiogram, normal ejection fraction.  Grade 1 diastolic dysfunction. LDL 148, hemoglobin A1c 5.5. Patient currently asymptomatic.  Seen by neurology.  Recommended to treat as TIA with following. -Dual antiplatelet therapy with aspirin  and Plavix  for 3 weeks then aspirin  alone - Treat hyperlipidemia with Crestor  40 mg daily. Follow-up with ophthalmology as already scheduled.     Stable for  discharge.     Discharge Diagnoses:  Principal Problem:   TIA (transient ischemic attack) Active Problems:   Hyperlipidemia   Macular hole of left eye    05/29/23 Patient has done well since her hospitalization.  She denies any further vision loss.  She is on dual antiplatelet therapy for a total of 21 days.  She plans to resume aspirin  81 mg after that.  She is on high intensity statin.  She denies any myalgias since starting this.  She will be due to recheck her cholesterol in 2 months.  She denies any chest pain or shortness of breath or dyspnea on exertion or palpitations.  She does have a soft lipoma-like mass on her dorsal right forearm.  This is located near the lateral epicondyle.  It is soft and squishy and nontender.  She just noticed this after the hospital.  It could be a cyst.  There is no skin discoloration 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 might limit the vision to less than 20/20   Osteopenia    PONV (postoperative nausea and vomiting)    Vaginal dryness 12/03/2012   Past Surgical History:  Procedure Laterality Date   ABDOMINAL HYSTERECTOMY     BREAST BIOPSY Left    BREAST EXCISIONAL BIOPSY Right  age 62s benign   CLOSED REDUCTION WRIST FRACTURE Right 02/24/2020   Procedure: CLOSED REDUCTION WRIST with pin placement;  Surgeon: Darrin Emerald, MD;  Location: [facility];  Service: Orthopedics;  Laterality: Right;   COLONOSCOPY N/A 08/25/2016   Procedure: COLONOSCOPY;  Surgeon: Alyce Jubilee, MD;  Location: [facility];  Service: Endoscopy;  Laterality: N/A;   DIAGNOSTIC LAPAROSCOPY     HARDWARE REMOVAL Right 05/29/2020   Procedure: PIN REMOVAL RIGHT WRIST;   Surgeon: Darrin Emerald, MD;  Location: [facility];  Service: Orthopedics;  Laterality: Right;   KNEE ARTHROSCOPY WITH MEDIAL MENISECTOMY  01/26/2012   Procedure: KNEE ARTHROSCOPY WITH MEDIAL MENISECTOMY;  Surgeon: Darrin Emerald, MD;  Location: [facility];  Service: Orthopedics;  Laterality: Left;   POLYPECTOMY  08/25/2016   Procedure: POLYPECTOMY;  Surgeon: Alyce Jubilee, MD;  Location: [facility];  Service: Endoscopy;;   TONSILLECTOMY     VITRECTOMY Right 06/04/2022   Current Outpatient Medications on File Prior to Visit  Medication Sig Dispense Refill   aspirin  EC 81 MG tablet Take 1 tablet (81 mg total) by mouth daily. Swallow whole. 30 tablet 12   b complex vitamins capsule Take 1 capsule by mouth daily.     bacitracin -polymyxin b (POLYSPORIN ) ophthalmic ointment Place 1 Application into the right eye at bedtime.     Calcium  Carbonate-Vitamin D  (CALCIUM  600 + D PO) Take 3 tablets by mouth daily. Calcifood     clopidogrel  (PLAVIX ) 75 MG tablet Take 1 tablet (75 mg total) by mouth daily for 21 days. 21 tablet 0   ibuprofen  (ADVIL ) 600 MG tablet Take 1 tablet (600 mg total) by mouth every 8 (eight) hours as needed. (Patient taking differently: Take 600 mg by mouth daily as needed for mild pain (pain score 1-3) or moderate pain (pain score 4-6).) 30 tablet 0   Multiple Vitamin (MULITIVITAMIN WITH MINERALS) TABS Take 1 tablet by mouth daily.     Omega-3 Fatty Acids (FISH OIL) 1200 MG CAPS Take 1,200 mg by mouth daily.     OVER THE COUNTER MEDICATION Take 1 each by mouth daily. Cyruta (Patient not taking: Reported on 05/29/2023)     OVER THE COUNTER MEDICATION Take 1 tablet by mouth daily. Cholaplex (Patient not taking: Reported on 05/29/2023)     rosuvastatin  (CRESTOR ) 40 MG tablet Take 1 tablet (40 mg total) by mouth daily. 90 tablet 0   SUMAtriptan  (IMITREX ) 100 MG tablet Take 1 tablet (100 mg total) by mouth every 2 (two) hours as needed for migraine or headache. May repeat in 2 hours if  needed. 10 tablet 1   Vitamin D , Cholecalciferol, 25 MCG (1000 UT) TABS Take 1,000 Units by mouth 2 (two) times daily.      No current facility-administered medications on file prior to visit.   No Known Allergies Social History   Socioeconomic History   Marital status: Married    Spouse name: Not on file   Number of children: Not on file   Years of education: Not on file   Highest education level: Not on file  Occupational History   Not on file  Tobacco Use   Smoking status: Never   Smokeless tobacco: Never  Vaping Use   Vaping status: Never Used  Substance and Sexual Activity   Alcohol use: Not Currently   Drug use: No   Sexual activity: Yes    Birth control/protection: Surgical, Post-menopausal    Comment: hyst  Other Topics Concern  Not on file  Social History Narrative   Not on file   Social Drivers of Health   Financial Resource Strain: Low Risk  (12/04/2022)   Overall Financial Resource Strain (CARDIA)    Difficulty of Paying Living Expenses: Not hard at all  Food Insecurity: No Food Insecurity (05/12/2023)   Hunger Vital Sign    Worried About Running Out of Food in the Last Year: Never true    Ran Out of Food in the Last Year: Never true  Transportation Needs: No Transportation Needs (05/12/2023)   PRAPARE - Administrator, Civil Service (Medical): No    Lack of Transportation (Non-Medical): No  Physical Activity: Sufficiently Active (12/04/2022)   Exercise Vital Sign    Days of Exercise per Week: 5 days    Minutes of Exercise per Session: 30 min  Stress: No Stress Concern Present (12/04/2022)   [employer] of Occupational Health - Occupational Stress Questionnaire    Feeling of Stress : Not at all  Social Connections: Moderately Integrated (05/12/2023)   Social Connection and Isolation Panel [NHANES]    Frequency of Communication with Friends and Family: More than three times a week    Frequency of Social Gatherings with Friends and Family:  Once a week    Attends Religious Services: More than 4 times per year    Active Member of [employer] or Organizations: No    Attends Banker Meetings: Never    Marital Status: Married  Catering manager Violence: Not At Risk (05/12/2023)   Humiliation, Afraid, Rape, and Kick questionnaire    Fear of Current or Ex-Partner: No    Emotionally Abused: No    Physically Abused: No    Sexually Abused: No   Family History  Problem Relation Age of Onset   Hypertension Mother    Hyperlipidemia Mother    Thyroid  disease Mother    Kidney disease Mother    Heart disease Father    Hyperlipidemia Father    Cancer Maternal Aunt    Cancer Maternal Uncle    Cancer Paternal Aunt    Cancer Paternal Uncle    Diabetes Maternal Grandmother    Cancer Paternal Grandmother        pancreatic   Hyperlipidemia Brother        borderline   Cancer Paternal Uncle    Alzheimer's disease Paternal Aunt    Colon cancer Neg Hx    Colon polyps Neg Hx    Breast cancer Neg Hx       Review of Systems  All other systems reviewed and are negative.      Objective:   Physical Exam Vitals reviewed.  Constitutional:      General: She is not in acute distress.    Appearance: Normal appearance. She is normal weight. She is not ill-appearing, toxic-appearing or diaphoretic.  HENT:     Head: Normocephalic and atraumatic.     Right Ear: Tympanic membrane, ear canal and external ear normal. There is no impacted cerumen.     Left Ear: Tympanic membrane, ear canal and external ear normal. There is no impacted cerumen.     Nose: Nose normal. No congestion or rhinorrhea.     Mouth/Throat:     Mouth: Mucous membranes are moist.     Pharynx: Oropharynx is clear. No oropharyngeal exudate or posterior oropharyngeal erythema.  Eyes:     General: No visual field deficit.    Extraocular Movements: Extraocular movements intact.  Conjunctiva/sclera: Conjunctivae normal.     Pupils: Pupils are equal, round, and  reactive to light.  Neck:     Vascular: No carotid bruit.  Cardiovascular:     Rate and Rhythm: Normal rate and regular rhythm.     Pulses: Normal pulses.     Heart sounds: Normal heart sounds. No murmur heard.    No friction rub. No gallop.  Pulmonary:     Effort: Pulmonary effort is normal. No respiratory distress.     Breath sounds: Normal breath sounds. No stridor. No wheezing, rhonchi or rales.  Abdominal:     General: Abdomen is flat. Bowel sounds are normal. There is no distension.     Palpations: Abdomen is soft. There is no mass.     Tenderness: There is no abdominal tenderness. There is no guarding or rebound.  Musculoskeletal:        General: No swelling or tenderness. Normal range of motion.     Cervical back: Normal range of motion. No rigidity or tenderness.     Right lower leg: No edema.     Left lower leg: No edema.  Lymphadenopathy:     Cervical: No cervical adenopathy.  Skin:    General: Skin is warm.     Coloration: Skin is not jaundiced or pale.     Findings: No bruising, erythema, lesion or rash.  Neurological:     General: No focal deficit present.     Mental Status: She is alert and oriented to person, place, and time. Mental status is at baseline.     Cranial Nerves: Facial asymmetry present. No cranial nerve deficit or dysarthria.     Sensory: No sensory deficit.     Motor: No weakness.     Coordination: Coordination normal.     Gait: Gait normal.     Deep Tendon Reflexes: Reflexes normal.  Psychiatric:        Mood and Affect: Mood normal.        Behavior: Behavior normal.        Thought Content: Thought content normal.        Judgment: Judgment normal.          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  Question is whether this is due to vasospasm or thrombus.  We will continue dual  antiplatelet therapy for a total of 21 days and then continue aspirin  81 mg a day thereafter.  Recommend high intensity statin to keep LDL cholesterol less than 55.  Recheck fasting lab work in 2 months.  Monitor cyst/mass on right forearm.  Currently it is 15 mm in diameter

## 2023-06-02 ENCOUNTER — Telehealth: Payer: Self-pay

## 2023-06-02 ENCOUNTER — Other Ambulatory Visit: Payer: Self-pay | Admitting: Family Medicine

## 2023-06-02 DIAGNOSIS — H353113 Nonexudative age-related macular degeneration, right eye, advanced atrophic without subfoveal involvement: Secondary | ICD-10-CM | POA: Diagnosis not present

## 2023-06-02 DIAGNOSIS — H353122 Nonexudative age-related macular degeneration, left eye, intermediate dry stage: Secondary | ICD-10-CM | POA: Diagnosis not present

## 2023-06-02 DIAGNOSIS — H34231 Retinal artery branch occlusion, right eye: Secondary | ICD-10-CM | POA: Diagnosis not present

## 2023-06-02 DIAGNOSIS — H35342 Macular cyst, hole, or pseudohole, left eye: Secondary | ICD-10-CM | POA: Diagnosis not present

## 2023-06-02 DIAGNOSIS — H35341 Macular cyst, hole, or pseudohole, right eye: Secondary | ICD-10-CM | POA: Diagnosis not present

## 2023-06-02 DIAGNOSIS — H3581 Retinal edema: Secondary | ICD-10-CM

## 2023-06-02 DIAGNOSIS — H35343 Macular cyst, hole, or pseudohole, bilateral: Secondary | ICD-10-CM | POA: Diagnosis not present

## 2023-06-02 DIAGNOSIS — H35073 Retinal telangiectasis, bilateral: Secondary | ICD-10-CM | POA: Diagnosis not present

## 2023-06-02 DIAGNOSIS — H35351 Cystoid macular degeneration, right eye: Secondary | ICD-10-CM | POA: Diagnosis not present

## 2023-06-02 DIAGNOSIS — S0501XD Injury of conjunctiva and corneal abrasion without foreign body, right eye, subsequent encounter: Secondary | ICD-10-CM | POA: Diagnosis not present

## 2023-06-02 DIAGNOSIS — H35352 Cystoid macular degeneration, left eye: Secondary | ICD-10-CM | POA: Diagnosis not present

## 2023-06-02 NOTE — Telephone Encounter (Signed)
 Copied from CRM [id number]. Topic: Clinical - Medical Advice >> Jun 02, 2023 10:43 AM Elle L wrote: Reason for CRM: The patient states that Dr. Seward Dao, her retina Doctor, will be sending a letter to Dr. Cheril Cork regarding her being tested for sleep apnea as soon as possible and would like to speak about this further. The patient's call back number is [phone].

## 2023-06-09 DIAGNOSIS — H35343 Macular cyst, hole, or pseudohole, bilateral: Secondary | ICD-10-CM | POA: Diagnosis not present

## 2023-06-09 DIAGNOSIS — Z961 Presence of intraocular lens: Secondary | ICD-10-CM | POA: Diagnosis not present

## 2023-06-09 DIAGNOSIS — G453 Amaurosis fugax: Secondary | ICD-10-CM | POA: Diagnosis not present

## 2023-07-01 DIAGNOSIS — H35353 Cystoid macular degeneration, bilateral: Secondary | ICD-10-CM | POA: Diagnosis not present

## 2023-07-01 DIAGNOSIS — H35073 Retinal telangiectasis, bilateral: Secondary | ICD-10-CM | POA: Diagnosis not present

## 2023-07-01 DIAGNOSIS — H353113 Nonexudative age-related macular degeneration, right eye, advanced atrophic without subfoveal involvement: Secondary | ICD-10-CM | POA: Diagnosis not present

## 2023-07-01 DIAGNOSIS — H353122 Nonexudative age-related macular degeneration, left eye, intermediate dry stage: Secondary | ICD-10-CM | POA: Diagnosis not present

## 2023-07-01 DIAGNOSIS — S0501XD Injury of conjunctiva and corneal abrasion without foreign body, right eye, subsequent encounter: Secondary | ICD-10-CM | POA: Diagnosis not present

## 2023-07-01 DIAGNOSIS — H35342 Macular cyst, hole, or pseudohole, left eye: Secondary | ICD-10-CM | POA: Diagnosis not present

## 2023-07-01 DIAGNOSIS — H34231 Retinal artery branch occlusion, right eye: Secondary | ICD-10-CM | POA: Diagnosis not present

## 2023-07-01 DIAGNOSIS — H35371 Puckering of macula, right eye: Secondary | ICD-10-CM | POA: Diagnosis not present

## 2023-07-01 DIAGNOSIS — H18831 Recurrent erosion of cornea, right eye: Secondary | ICD-10-CM | POA: Diagnosis not present

## 2023-07-02 DIAGNOSIS — Z961 Presence of intraocular lens: Secondary | ICD-10-CM | POA: Diagnosis not present

## 2023-07-02 DIAGNOSIS — S0501XA Injury of conjunctiva and corneal abrasion without foreign body, right eye, initial encounter: Secondary | ICD-10-CM | POA: Diagnosis not present

## 2023-07-03 ENCOUNTER — Other Ambulatory Visit (HOSPITAL_COMMUNITY): Payer: Self-pay | Admitting: Adult Health

## 2023-07-03 DIAGNOSIS — Z1231 Encounter for screening mammogram for malignant neoplasm of breast: Secondary | ICD-10-CM

## 2023-07-06 DIAGNOSIS — Z961 Presence of intraocular lens: Secondary | ICD-10-CM | POA: Diagnosis not present

## 2023-07-06 DIAGNOSIS — S0501XD Injury of conjunctiva and corneal abrasion without foreign body, right eye, subsequent encounter: Secondary | ICD-10-CM | POA: Diagnosis not present

## 2023-07-08 DIAGNOSIS — Z961 Presence of intraocular lens: Secondary | ICD-10-CM | POA: Diagnosis not present

## 2023-07-08 DIAGNOSIS — G453 Amaurosis fugax: Secondary | ICD-10-CM | POA: Diagnosis not present

## 2023-07-08 DIAGNOSIS — H40051 Ocular hypertension, right eye: Secondary | ICD-10-CM | POA: Diagnosis not present

## 2023-07-08 DIAGNOSIS — H35343 Macular cyst, hole, or pseudohole, bilateral: Secondary | ICD-10-CM | POA: Diagnosis not present

## 2023-07-14 DIAGNOSIS — H35351 Cystoid macular degeneration, right eye: Secondary | ICD-10-CM | POA: Diagnosis not present

## 2023-07-14 DIAGNOSIS — H353113 Nonexudative age-related macular degeneration, right eye, advanced atrophic without subfoveal involvement: Secondary | ICD-10-CM | POA: Diagnosis not present

## 2023-07-14 DIAGNOSIS — S0501XD Injury of conjunctiva and corneal abrasion without foreign body, right eye, subsequent encounter: Secondary | ICD-10-CM | POA: Diagnosis not present

## 2023-07-14 DIAGNOSIS — H18831 Recurrent erosion of cornea, right eye: Secondary | ICD-10-CM | POA: Diagnosis not present

## 2023-07-14 DIAGNOSIS — H35073 Retinal telangiectasis, bilateral: Secondary | ICD-10-CM | POA: Diagnosis not present

## 2023-07-14 DIAGNOSIS — H34231 Retinal artery branch occlusion, right eye: Secondary | ICD-10-CM | POA: Diagnosis not present

## 2023-07-14 DIAGNOSIS — H353122 Nonexudative age-related macular degeneration, left eye, intermediate dry stage: Secondary | ICD-10-CM | POA: Diagnosis not present

## 2023-07-14 DIAGNOSIS — H35371 Puckering of macula, right eye: Secondary | ICD-10-CM | POA: Diagnosis not present

## 2023-07-14 DIAGNOSIS — H4061X2 Glaucoma secondary to drugs, right eye, moderate stage: Secondary | ICD-10-CM | POA: Diagnosis not present

## 2023-07-15 ENCOUNTER — Ambulatory Visit (HOSPITAL_COMMUNITY)
Admission: RE | Admit: 2023-07-15 | Discharge: 2023-07-15 | Disposition: A | Discharge status: Home / Self Care | Source: Ambulatory Visit | Attending: Adult Health | Admitting: Adult Health

## 2023-07-15 DIAGNOSIS — Z1231 Encounter for screening mammogram for malignant neoplasm of breast: Secondary | ICD-10-CM | POA: Insufficient documentation

## 2023-07-21 ENCOUNTER — Ambulatory Visit: Admitting: Adult Health

## 2023-07-22 ENCOUNTER — Ambulatory Visit: Payer: Self-pay | Admitting: Adult Health

## 2023-07-28 ENCOUNTER — Encounter: Payer: Medicare PPO | Admitting: Family Medicine

## 2023-07-29 DIAGNOSIS — Z1283 Encounter for screening for malignant neoplasm of skin: Secondary | ICD-10-CM | POA: Diagnosis not present

## 2023-07-29 DIAGNOSIS — D225 Melanocytic nevi of trunk: Secondary | ICD-10-CM | POA: Diagnosis not present

## 2023-07-30 ENCOUNTER — Other Ambulatory Visit

## 2023-07-30 DIAGNOSIS — M8589 Other specified disorders of bone density and structure, multiple sites: Secondary | ICD-10-CM | POA: Diagnosis not present

## 2023-07-30 DIAGNOSIS — Z0000 Encounter for general adult medical examination without abnormal findings: Secondary | ICD-10-CM

## 2023-07-30 DIAGNOSIS — E785 Hyperlipidemia, unspecified: Secondary | ICD-10-CM | POA: Diagnosis not present

## 2023-07-31 ENCOUNTER — Ambulatory Visit: Payer: Self-pay | Admitting: Family Medicine

## 2023-07-31 LAB — COMPLETE METABOLIC PANEL WITHOUT GFR
AG Ratio: 2.4 (calc) (ref 1.0–2.5)
ALT: 27 U/L (ref 6–29)
AST: 25 U/L (ref 10–35)
Albumin: 4.8 g/dL (ref 3.6–5.1)
Alkaline phosphatase (APISO): 53 U/L (ref 37–153)
BUN: 18 mg/dL (ref 7–25)
CO2: 29 mmol/L (ref 20–32)
Calcium: 9.4 mg/dL (ref 8.6–10.4)
Chloride: 101 mmol/L (ref 98–110)
Creat: 0.65 mg/dL (ref 0.50–1.05)
Globulin: 2 g/dL (ref 1.9–3.7)
Glucose, Bld: 76 mg/dL (ref 65–99)
Potassium: 4.8 mmol/L (ref 3.5–5.3)
Sodium: 139 mmol/L (ref 135–146)
Total Bilirubin: 0.5 mg/dL (ref 0.2–1.2)
Total Protein: 6.8 g/dL (ref 6.1–8.1)

## 2023-07-31 LAB — CBC WITH DIFFERENTIAL/PLATELET
Absolute Lymphocytes: 2398 {cells}/uL (ref 850–3900)
Absolute Monocytes: 534 {cells}/uL (ref 200–950)
Basophils Absolute: 98 {cells}/uL (ref 0–200)
Basophils Relative: 0.9 %
Eosinophils Absolute: 196 {cells}/uL (ref 15–500)
Eosinophils Relative: 1.8 %
HCT: 45 % (ref 35.0–45.0)
Hemoglobin: 14.4 g/dL (ref 11.7–15.5)
MCH: 29.9 pg (ref 27.0–33.0)
MCHC: 32 g/dL (ref 32.0–36.0)
MCV: 93.6 fL (ref 80.0–100.0)
MPV: 10.1 fL (ref 7.5–12.5)
Monocytes Relative: 4.9 %
Neutro Abs: 7674 {cells}/uL (ref 1500–7800)
Neutrophils Relative %: 70.4 %
Platelets: 699 Thousand/uL — ABNORMAL HIGH (ref 140–400)
RBC: 4.81 Million/uL (ref 3.80–5.10)
RDW: 13.7 % (ref 11.0–15.0)
Total Lymphocyte: 22 %
WBC: 10.9 Thousand/uL — ABNORMAL HIGH (ref 3.8–10.8)

## 2023-07-31 LAB — LIPID PANEL
Cholesterol: 164 mg/dL (ref <200)
HDL: 66 mg/dL (ref >=50)
LDL Cholesterol (Calc): 82 mg/dL
Non-HDL Cholesterol (Calc): 98 mg/dL (ref <130)
Total CHOL/HDL Ratio: 2.5 (calc) (ref <5.0)
Triglycerides: 82 mg/dL (ref <150)

## 2023-07-31 LAB — TSH: TSH: 2.24 m[IU]/L (ref 0.40–4.50)

## 2023-07-31 LAB — VITAMIN D 25 HYDROXY (VIT D DEFICIENCY, FRACTURES): Vit D, 25-Hydroxy: 57 ng/mL (ref 30–100)

## 2023-08-04 ENCOUNTER — Institutional Professional Consult (permissible substitution): Admitting: Neurology

## 2023-08-06 DIAGNOSIS — S0501XD Injury of conjunctiva and corneal abrasion without foreign body, right eye, subsequent encounter: Secondary | ICD-10-CM | POA: Diagnosis not present

## 2023-08-06 DIAGNOSIS — H353113 Nonexudative age-related macular degeneration, right eye, advanced atrophic without subfoveal involvement: Secondary | ICD-10-CM | POA: Diagnosis not present

## 2023-08-06 DIAGNOSIS — H18831 Recurrent erosion of cornea, right eye: Secondary | ICD-10-CM | POA: Diagnosis not present

## 2023-08-06 DIAGNOSIS — H35341 Macular cyst, hole, or pseudohole, right eye: Secondary | ICD-10-CM | POA: Diagnosis not present

## 2023-08-06 DIAGNOSIS — H35371 Puckering of macula, right eye: Secondary | ICD-10-CM | POA: Diagnosis not present

## 2023-08-06 DIAGNOSIS — H4061X2 Glaucoma secondary to drugs, right eye, moderate stage: Secondary | ICD-10-CM | POA: Diagnosis not present

## 2023-08-06 DIAGNOSIS — H353122 Nonexudative age-related macular degeneration, left eye, intermediate dry stage: Secondary | ICD-10-CM | POA: Diagnosis not present

## 2023-08-06 DIAGNOSIS — H35353 Cystoid macular degeneration, bilateral: Secondary | ICD-10-CM | POA: Diagnosis not present

## 2023-08-06 DIAGNOSIS — H35073 Retinal telangiectasis, bilateral: Secondary | ICD-10-CM | POA: Diagnosis not present

## 2023-08-06 LAB — HM DIABETES EYE EXAM

## 2023-08-07 ENCOUNTER — Encounter: Payer: Self-pay | Admitting: Family Medicine

## 2023-08-07 ENCOUNTER — Ambulatory Visit: Admitting: Family Medicine

## 2023-08-07 VITALS — BP 104/75 | HR 79 | Temp 98.3°F | Ht 62.0 in | Wt 121.4 lb

## 2023-08-07 DIAGNOSIS — H3581 Retinal edema: Secondary | ICD-10-CM

## 2023-08-07 DIAGNOSIS — D75839 Thrombocytosis, unspecified: Secondary | ICD-10-CM

## 2023-08-07 DIAGNOSIS — G459 Transient cerebral ischemic attack, unspecified: Secondary | ICD-10-CM

## 2023-08-07 DIAGNOSIS — Z0001 Encounter for general adult medical examination with abnormal findings: Secondary | ICD-10-CM | POA: Diagnosis not present

## 2023-08-07 DIAGNOSIS — E785 Hyperlipidemia, unspecified: Secondary | ICD-10-CM

## 2023-08-07 DIAGNOSIS — Z0000 Encounter for general adult medical examination without abnormal findings: Secondary | ICD-10-CM

## 2023-08-07 NOTE — Progress Notes (Signed)
 Subjective:    Patient ID: Kylie Duffy, female    DOB: 1955-07-28, 68 y.o.   MRN: [id number]  HPI Patient is a very pleasant 68 year old Caucasian female here today for complete physical exam.  Patient had a bone density test November 2024 that showed stable osteopenia with a T-score of -2.4 at its worst.  Patient had a mammogram June 18 of this year which was negative for malignancy.  Patient's last colonoscopy was in 2018.  She had a tubular adenoma removed at that time.  Therefore, it has been 7 years since her last colonoscopy.  We discussed scheduling a colonoscopy however the patient would like to wait.  She was admitted earlier this year for transient vision loss.  This was assumed to be either a TIA versus a macular hole.  She is seeing ophthalmology.  We have started her on cholesterol medication and her LDL cholesterol has fallen from 148-82.  However the patient has persistent thrombocytosis.  I reviewed her past medical records to 2015.  This has been a chronic problem for this patient.  However her platelet counts have typically averaged around 500-550.  After last year, the patient had documented CMV infection.  After that her platelet counts have been substantially higher.  Most recently it was over 700.  Immunization History  Administered Date(s) Administered   Influenza,inj,Quad PF,6+ Mos 10/31/2016   Influenza-Unspecified 11/02/2013, 11/03/2014, 11/02/2015   Moderna Sars-Covid-2 Vaccination 10/06/2019, 11/03/2019   Tdap 04/24/2006, 07/13/2018   Patient is due for the pneumonia vaccine as well as the shingles vaccine.  Most recent lab work is listed below Lab on 07/30/2023  Component Date Value Ref Range Status   TSH 07/30/2023 2.24  0.40 - 4.50 mIU/L Final   Vit D, 25-Hydroxy 07/30/2023 57  30 - 100 ng/mL Final   Comment: Vitamin D  Status         25-OH Vitamin D : . Deficiency:                    <20 ng/mL Insufficiency:             20 - 29 ng/mL Optimal:                 >  or = 30 ng/mL . For 25-OH Vitamin D  testing on patients on  D2-supplementation and patients for whom quantitation  of D2 and D3 fractions is required, the [facility](TM) 25-OH VIT D, (D2,D3), LC/MS/MS is recommended: order  code 07111 (patients >78yrs). . See Note 1 . Note 1 . For additional information, please refer to  http://education.[facility].com/faq/FAQ199  (This link is being provided for informational/ educational purposes only.)    Cholesterol 07/30/2023 164  <200 mg/dL Final   HDL 92/96/7974 66  > OR = 50 mg/dL Final   Triglycerides 92/96/7974 82  <150 mg/dL Final   LDL Cholesterol (Calc) 07/30/2023 82  mg/dL (calc) Final   Comment: Reference range: <100 . Desirable range <100 mg/dL for primary prevention;   <70 mg/dL for patients with CHD or diabetic patients  with > or = 2 CHD risk factors. SABRA LDL-C is now calculated using the Martin-Hopkins  calculation, which is a validated novel method providing  better accuracy than the Friedewald equation in the  estimation of LDL-C.  Gladis APPLETHWAITE et al. SANDREA. 7986;689(80): 2061-2068  (http://education.[facility].com/faq/FAQ164)    Total CHOL/HDL Ratio 07/30/2023 2.5  <4.9 (calc) Final   Non-HDL Cholesterol (Calc) 07/30/2023 98  <130 mg/dL (calc) Final   Comment:  For patients with diabetes plus 1 major ASCVD risk  factor, treating to a non-HDL-C goal of <100 mg/dL  (LDL-C of <29 mg/dL) is considered a therapeutic  option.    Glucose, Bld 07/30/2023 76  65 - 99 mg/dL Final   Comment: .            Fasting reference interval .    BUN 07/30/2023 18  7 - 25 mg/dL Final   Creat 92/96/7974 0.65  0.50 - 1.05 mg/dL Final   BUN/Creatinine Ratio 07/30/2023 SEE NOTE:  6 - 22 (calc) Final   Comment:    Not Reported: BUN and Creatinine are within    reference range. .    Sodium 07/30/2023 139  135 - 146 mmol/L Final   Potassium 07/30/2023 4.8  3.5 - 5.3 mmol/L Final   Chloride 07/30/2023 101  98 - 110 mmol/L Final    CO2 07/30/2023 29  20 - 32 mmol/L Final   Calcium  07/30/2023 9.4  8.6 - 10.4 mg/dL Final   Total Protein 92/96/7974 6.8  6.1 - 8.1 g/dL Final   Albumin 92/96/7974 4.8  3.6 - 5.1 g/dL Final   Globulin 92/96/7974 2.0  1.9 - 3.7 g/dL (calc) Final   AG Ratio 07/30/2023 2.4  1.0 - 2.5 (calc) Final   Total Bilirubin 07/30/2023 0.5  0.2 - 1.2 mg/dL Final   Alkaline phosphatase (APISO) 07/30/2023 53  37 - 153 U/L Final   AST 07/30/2023 25  10 - 35 U/L Final   ALT 07/30/2023 27  6 - 29 U/L Final   WBC 07/30/2023 10.9 (H)  3.8 - 10.8 Thousand/uL Final   RBC 07/30/2023 4.81  3.80 - 5.10 Million/uL Final   Hemoglobin 07/30/2023 14.4  11.7 - 15.5 g/dL Final   HCT 92/96/7974 45.0  35.0 - 45.0 % Final   MCV 07/30/2023 93.6  80.0 - 100.0 fL Final   MCH 07/30/2023 29.9  27.0 - 33.0 pg Final   MCHC 07/30/2023 32.0  32.0 - 36.0 g/dL Final   Comment: For adults, a slight decrease in the calculated MCHC value (in the range of 30 to 32 g/dL) is most likely not clinically significant; however, it should be interpreted with caution in correlation with other red cell parameters and the patient's clinical condition.    RDW 07/30/2023 13.7  11.0 - 15.0 % Final   Platelets 07/30/2023 699 (H)  140 - 400 Thousand/uL Final   MPV 07/30/2023 10.1  7.5 - 12.5 fL Final   Neutro Abs 07/30/2023 7,674  1,500 - 7,800 cells/uL Final   Absolute Lymphocytes 07/30/2023 2,398  850 - 3,900 cells/uL Final   Absolute Monocytes 07/30/2023 534  200 - 950 cells/uL Final   Eosinophils Absolute 07/30/2023 196  15 - 500 cells/uL Final   Basophils Absolute 07/30/2023 98  0 - 200 cells/uL Final   Neutrophils Relative % 07/30/2023 70.4  % Final   Total Lymphocyte 07/30/2023 22.0  % Final   Monocytes Relative 07/30/2023 4.9  % Final   Eosinophils Relative 07/30/2023 1.8  % Final   Basophils Relative 07/30/2023 0.9  % Final   Past Medical History:  Diagnosis Date   Burning with urination 11/01/2014   Hematuria 07/28/2013   History  of UTI    Hyperlipidemia    Nuclear sclerotic cataract of right eye 04/23/2021   Dense cataract, some contribution to the poor vision in the right eye, although ultimately the previous spontaneous macular hole and closure in the right eye might limit  the vision to less than 20/20   Osteopenia    PONV (postoperative nausea and vomiting)    Vaginal dryness 12/03/2012   Past Surgical History:  Procedure Laterality Date   ABDOMINAL HYSTERECTOMY     BREAST BIOPSY Left    BREAST EXCISIONAL BIOPSY Right    age 40s benign   CLOSED REDUCTION WRIST FRACTURE Right 02/24/2020   Procedure: CLOSED REDUCTION WRIST with pin placement;  Surgeon: Margrette Taft BRAVO, MD;  Location: [facility];  Service: Orthopedics;  Laterality: Right;   COLONOSCOPY N/A 08/25/2016   Procedure: COLONOSCOPY;  Surgeon: Harvey Margo CROME, MD;  Location: [facility];  Service: Endoscopy;  Laterality: N/A;   DIAGNOSTIC LAPAROSCOPY     HARDWARE REMOVAL Right 05/29/2020   Procedure: PIN REMOVAL RIGHT WRIST;  Surgeon: Margrette Taft BRAVO, MD;  Location: [facility];  Service: Orthopedics;  Laterality: Right;   KNEE ARTHROSCOPY WITH MEDIAL MENISECTOMY  01/26/2012   Procedure: KNEE ARTHROSCOPY WITH MEDIAL MENISECTOMY;  Surgeon: Taft BRAVO Margrette, MD;  Location: [facility];  Service: Orthopedics;  Laterality: Left;   POLYPECTOMY  08/25/2016   Procedure: POLYPECTOMY;  Surgeon: Harvey Margo CROME, MD;  Location: [facility];  Service: Endoscopy;;   TONSILLECTOMY     VITRECTOMY Right 06/04/2022   Current Outpatient Medications on File Prior to Visit  Medication Sig Dispense Refill   aspirin  EC 81 MG tablet Take 1 tablet (81 mg total) by mouth daily. Swallow whole. 30 tablet 12   b complex vitamins capsule Take 1 capsule by mouth daily.     Calcium  Carbonate-Vitamin D  (CALCIUM  600 + D PO) Take 4 tablets by mouth daily. Calcifood. Pt is taking 250 mg per day     ibuprofen  (ADVIL ) 600 MG tablet Take 1 tablet (600 mg total) by mouth every 8 (eight)  hours as needed. 30 tablet 0   Multiple Vitamin (MULITIVITAMIN WITH MINERALS) TABS Take 1 tablet by mouth daily.     Omega-3 Fatty Acids (FISH OIL) 1200 MG CAPS Take 1,200 mg by mouth daily.     rosuvastatin  (CRESTOR ) 40 MG tablet Take 1 tablet (40 mg total) by mouth daily. 90 tablet 0   SUMAtriptan  (IMITREX ) 100 MG tablet Take 1 tablet (100 mg total) by mouth every 2 (two) hours as needed for migraine or headache. May repeat in 2 hours if needed. 10 tablet 1   Vitamin D , Cholecalciferol, 25 MCG (1000 UT) TABS Take 1,000 Units by mouth 2 (two) times daily.      bacitracin -polymyxin b (POLYSPORIN ) ophthalmic ointment Place 1 Application into the right eye at bedtime.     No current facility-administered medications on file prior to visit.   No Known Allergies Social History   Socioeconomic History   Marital status: Married    Spouse name: Not on file   Number of children: Not on file   Years of education: Not on file   Highest education level: Bachelor's degree (e.g., BA, AB, BS)  Occupational History   Not on file  Tobacco Use   Smoking status: Never   Smokeless tobacco: Never  Vaping Use   Vaping status: Never Used  Substance and Sexual Activity   Alcohol use: Not Currently   Drug use: No   Sexual activity: Yes    Birth control/protection: Surgical, Post-menopausal    Comment: hyst  Other Topics Concern   Not on file  Social History Narrative   Not on file   Social Drivers of Health   Financial Resource Strain: Low  Risk  (08/02/2023)   Overall Financial Resource Strain (CARDIA)    Difficulty of Paying Living Expenses: Not very hard  Food Insecurity: No Food Insecurity (08/02/2023)   Hunger Vital Sign    Worried About Running Out of Food in the Last Year: Never true    Ran Out of Food in the Last Year: Never true  Transportation Needs: No Transportation Needs (08/02/2023)   PRAPARE - Administrator, Civil Service (Medical): No    Lack of Transportation  (Non-Medical): No  Physical Activity: Insufficiently Active (08/02/2023)   Exercise Vital Sign    Days of Exercise per Week: 7 days    Minutes of Exercise per Session: 20 min  Stress: No Stress Concern Present (08/02/2023)   [employer] of Occupational Health - Occupational Stress Questionnaire    Feeling of Stress: Not at all  Social Connections: Moderately Integrated (08/02/2023)   Social Connection and Isolation Panel    Frequency of Communication with Friends and Family: More than three times a week    Frequency of Social Gatherings with Friends and Family: Once a week    Attends Religious Services: More than 4 times per year    Active Member of [employer] or Organizations: No    Attends Engineer, structural: Not on file    Marital Status: Married  Catering manager Violence: Not At Risk (05/12/2023)   Humiliation, Afraid, Rape, and Kick questionnaire    Fear of Current or Ex-Partner: No    Emotionally Abused: No    Physically Abused: No    Sexually Abused: No   Family History  Problem Relation Age of Onset   Hypertension Mother    Hyperlipidemia Mother    Thyroid  disease Mother    Kidney disease Mother    Heart disease Father    Hyperlipidemia Father    Cancer Maternal Aunt    Cancer Maternal Uncle    Cancer Paternal Aunt    Cancer Paternal Uncle    Diabetes Maternal Grandmother    Cancer Paternal Grandmother        pancreatic   Hyperlipidemia Brother        borderline   Cancer Paternal Uncle    Alzheimer's disease Paternal Aunt    Colon cancer Neg Hx    Colon polyps Neg Hx    Breast cancer Neg Hx       Review of Systems  All other systems reviewed and are negative.      Objective:   Physical Exam Vitals reviewed.  Constitutional:      General: She is not in acute distress.    Appearance: Normal appearance. She is normal weight. She is not ill-appearing, toxic-appearing or diaphoretic.  HENT:     Head: Normocephalic and atraumatic.     Right Ear:  Tympanic membrane, ear canal and external ear normal. There is no impacted cerumen.     Left Ear: Tympanic membrane, ear canal and external ear normal. There is no impacted cerumen.     Nose: Nose normal. No congestion or rhinorrhea.     Mouth/Throat:     Mouth: Mucous membranes are moist.     Pharynx: Oropharynx is clear. No oropharyngeal exudate or posterior oropharyngeal erythema.  Eyes:     Extraocular Movements: Extraocular movements intact.     Conjunctiva/sclera: Conjunctivae normal.     Pupils: Pupils are equal, round, and reactive to light.  Neck:     Vascular: No carotid bruit.  Cardiovascular:  Rate and Rhythm: Normal rate and regular rhythm.     Pulses: Normal pulses.     Heart sounds: Normal heart sounds. No murmur heard.    No friction rub. No gallop.  Pulmonary:     Effort: Pulmonary effort is normal. No respiratory distress.     Breath sounds: Normal breath sounds. No stridor. No wheezing, rhonchi or rales.  Abdominal:     General: Abdomen is flat. Bowel sounds are normal. There is no distension.     Palpations: Abdomen is soft. There is no mass.     Tenderness: There is no abdominal tenderness. There is no guarding or rebound.  Musculoskeletal:        General: No swelling or tenderness. Normal range of motion.     Cervical back: Normal range of motion. No rigidity or tenderness.     Right lower leg: No edema.     Left lower leg: No edema.  Lymphadenopathy:     Cervical: No cervical adenopathy.  Skin:    General: Skin is warm.     Coloration: Skin is not jaundiced or pale.     Findings: No bruising, erythema, lesion or rash.  Neurological:     General: No focal deficit present.     Mental Status: She is alert and oriented to person, place, and time. Mental status is at baseline.     Cranial Nerves: No cranial nerve deficit.     Sensory: No sensory deficit.     Motor: No weakness.     Coordination: Coordination normal.     Gait: Gait normal.     Deep  Tendon Reflexes: Reflexes normal.  Psychiatric:        Mood and Affect: Mood normal.        Behavior: Behavior normal.        Thought Content: Thought content normal.        Judgment: Judgment normal.          Assessment & Plan:  Thrombocytosis - Plan: Ambulatory referral to Hematology / Oncology, B. burgdorfi antibodies by WB  TIA (transient ischemic attack)  Hyperlipidemia, unspecified hyperlipidemia type  Macular edema  Routine general medical examination at a health care facility We discussed the shingles shot today.  We discussed the pneumonia vaccine.  The patient prefers to defer these at the present time.  Her bone density test is not due again until 2027.  Her mammogram is due in June 2026.  She is due for a colonoscopy now however she defers this at the present time.  We discussed her thrombocytosis.  I believe that she has chronic longstanding idiopathic normocytosis relieved with the recent elevation has been a reaction to the severe CMV infection that she had last year.  However I will screen the patient for Lyme disease given her recent Bell's palsy.  I will also consult hematology for a second opinion.

## 2023-08-10 ENCOUNTER — Encounter: Payer: Self-pay | Admitting: Neurology

## 2023-08-10 ENCOUNTER — Ambulatory Visit: Admitting: Neurology

## 2023-08-10 VITALS — BP 129/84 | HR 88 | Ht 62.5 in | Wt 120.0 lb

## 2023-08-10 DIAGNOSIS — R351 Nocturia: Secondary | ICD-10-CM | POA: Diagnosis not present

## 2023-08-10 DIAGNOSIS — H35342 Macular cyst, hole, or pseudohole, left eye: Secondary | ICD-10-CM | POA: Diagnosis not present

## 2023-08-10 DIAGNOSIS — R0683 Snoring: Secondary | ICD-10-CM

## 2023-08-10 DIAGNOSIS — H34231 Retinal artery branch occlusion, right eye: Secondary | ICD-10-CM

## 2023-08-10 DIAGNOSIS — G459 Transient cerebral ischemic attack, unspecified: Secondary | ICD-10-CM | POA: Diagnosis not present

## 2023-08-10 DIAGNOSIS — Z9189 Other specified personal risk factors, not elsewhere classified: Secondary | ICD-10-CM | POA: Diagnosis not present

## 2023-08-10 DIAGNOSIS — H3530 Unspecified macular degeneration: Secondary | ICD-10-CM | POA: Diagnosis not present

## 2023-08-10 DIAGNOSIS — R519 Headache, unspecified: Secondary | ICD-10-CM | POA: Diagnosis not present

## 2023-08-10 NOTE — Progress Notes (Signed)
 Subjective:    Patient ID: Kylie Duffy is a 68 y.o. female.  HPI    True Mar, MD, PhD [facility] [address], [address] [address] [place], [place] [address]  Dear Dr. Duanne,   I saw your patient, Kylie Duffy, upon your kind request in my sleep clinic today for initial consultation of her sleep disorder, in particular, concern for underlying obstructive sleep apnea.  The patient is unaccompanied today.  As you know, Kylie Duffy is a 68 year old female with an underlying medical history of TIA, cataracts, history of UTI, osteopenia, hyperlipidemia, and macular degeneration, macular hole with status post surgery, who reports snoring and difficulty maintaining sleep.  She often falls asleep watching TV in the den and then has trouble going to bed and sleeping there.  She has had recent stressors including problems with her right eye, she has developed glaucoma, she had a procedure for her macular hole repair.  She has another procedure planned for it to remove the silicone oil.  She has a bedtime of around 11 and rise time between 7 and 7:15 AM.  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  Patient sees her daily.  Patient does not watch TV in her bedroom.  She has nocturia about once per average night, she has had rare morning headaches.  She wears a retainer at night, she had braces later in life, at age 61.  She had a tonsillectomy as a child.  She limits her caffeine to 2 to 3 cups of coffee per day, no soda or tea daily.  No alcohol, she is a non-smoker.  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  She was encouraged by her retina specialist to get checked out for sleep apnea.  She saw Dr. Roz and then Dr. Elner.  I reviewed the office visit note with Dr. Elner from 06/02/2023.  Her Epworth sleepiness score is 4 out of 24, fatigue severity score is 18 out of 63.  She has a history  of Bell's palsy in the past year.    Her Past Medical History Is Significant For: Past Medical History:  Diagnosis Date   Blood transfusion without reported diagnosis 1993   Burning with urination 11/01/2014   Hematuria 07/28/2013   History of UTI    Hyperlipidemia    Nuclear sclerotic cataract of right eye 04/23/2021   Dense cataract, some contribution to the poor vision in the right eye, although ultimately the previous spontaneous macular hole and closure in the right eye might limit the vision to less than 20/20   Osteopenia    PONV (postoperative nausea and vomiting)    Vaginal dryness 12/03/2012    Her Past Surgical History Is Significant For: Past Surgical History:  Procedure Laterality Date   ABDOMINAL HYSTERECTOMY  06/2000   BREAST BIOPSY Left    BREAST EXCISIONAL BIOPSY Right    age 55s benign   CLOSED REDUCTION WRIST FRACTURE Right 02/24/2020   Procedure: CLOSED REDUCTION WRIST with pin placement;  Surgeon: Margrette Taft BRAVO, MD;  Location: [facility];  Service: Orthopedics;  Laterality: Right;   COLONOSCOPY N/A 08/25/2016   Procedure: COLONOSCOPY;  Surgeon: Harvey Margo CROME, MD;  Location: [facility];  Service: Endoscopy;  Laterality: N/A;   DIAGNOSTIC LAPAROSCOPY     EYE SURGERY  2023   Also 2024, 2025   FRACTURE SURGERY  02/16/2020   HARDWARE REMOVAL Right 05/29/2020   Procedure: PIN REMOVAL RIGHT WRIST;  Surgeon: Margrette Taft BRAVO, MD;  Location: [facility];  Service: Orthopedics;  Laterality: Right;   KNEE ARTHROSCOPY WITH MEDIAL MENISECTOMY  01/26/2012   Procedure: KNEE ARTHROSCOPY WITH MEDIAL MENISECTOMY;  Surgeon: Taft BRAVO Margrette, MD;  Location: [facility];  Service: Orthopedics;  Laterality: Left;   POLYPECTOMY  08/25/2016   Procedure: POLYPECTOMY;  Surgeon: Harvey Margo CROME, MD;  Location: [facility];  Service: Endoscopy;;   TONSILLECTOMY     VITRECTOMY Right 06/04/2022    Her Family History Is Significant For: Family History  Problem Relation Age of  Onset   Hypertension Mother    Hyperlipidemia Mother    Thyroid  disease Mother    Kidney disease Mother    Snoring Mother    Arthritis Mother    Varicose Veins Mother    Snoring Father    Heart disease Father    Hyperlipidemia Father    Arthritis Father    Hypertension Father    Hyperlipidemia Brother        borderline   Diabetes Maternal Grandmother    Varicose Veins Maternal Grandmother    Cancer Paternal Grandmother        pancreatic   Early death Paternal Grandmother    Cancer Maternal Aunt    Cancer Maternal Uncle    Cancer Paternal Aunt    Alzheimer's disease Paternal Aunt    Cancer Paternal Uncle    Cancer Paternal Uncle    Colon cancer Neg Hx    Colon polyps Neg Hx    Breast cancer Neg Hx    Sleep apnea Neg Hx     Her Social History Is Significant For: Social History   Socioeconomic History   Marital status: Married    Spouse name: Not on file   Number of children: Not on file   Years of education: Not on file   Highest education level: Bachelor's degree (e.g., BA, AB, BS)  Occupational History   Not on file  Tobacco Use   Smoking status: Never   Smokeless tobacco: Never  Vaping Use   Vaping status: Never Used  Substance and Sexual Activity   Alcohol use: Not Currently   Drug use: No   Sexual activity: Yes    Birth control/protection: Surgical, Post-menopausal    Comment: hyst  Other Topics Concern   Not on file  Social History Narrative   Lives at home with husband   Right handed   Caffeine: 2-3 cups/day   Social Drivers of Corporate investment banker Strain: Low Risk  (08/02/2023)   Overall Financial Resource Strain (CARDIA)    Difficulty of Paying Living Expenses: Not very hard  Food Insecurity: No Food Insecurity (08/02/2023)   Hunger Vital Sign    Worried About Running Out of Food in the Last Year: Never true    Ran Out of Food in the Last Year: Never true  Transportation Needs: No Transportation Needs (08/02/2023)   PRAPARE -  Administrator, Civil Service (Medical): No    Lack of Transportation (Non-Medical): No  Physical Activity: Insufficiently Active (08/02/2023)   Exercise Vital Sign    Days of Exercise per Week: 7 days    Minutes of Exercise per Session: 20 min  Stress: No Stress Concern Present (08/02/2023)   [employer] of Occupational Health - Occupational Stress Questionnaire    Feeling of Stress: Not at all  Social Connections: Moderately Integrated (  08/02/2023)   Social Connection and Isolation Panel    Frequency of Communication with Friends and Family: More than three times a week    Frequency of Social Gatherings with Friends and Family: Once a week    Attends Religious Services: More than 4 times per year    Active Member of [employer] or Organizations: No    Attends Engineer, structural: Not on file    Marital Status: Married    Her Allergies Are:  No Known Allergies:   Her Current Medications Are:  Outpatient Encounter Medications as of 08/10/2023  Medication Sig   aspirin  EC 81 MG tablet Take 1 tablet (81 mg total) by mouth daily. Swallow whole.   b complex vitamins capsule Take 1 capsule by mouth daily.   bacitracin -polymyxin b (POLYSPORIN ) ophthalmic ointment Place 1 Application into the right eye at bedtime.   Calcium  Carbonate-Vitamin D  (CALCIUM  600 + D PO) Take 4 tablets by mouth daily. Calcifood. Pt is taking 250 mg per day   COMBIGAN 0.2-0.5 % ophthalmic solution Place 1 drop into the right eye 2 (two) times daily.   Ergocalciferol  (VITAMIN D2) 50 MCG (2000 UT) TABS Take 2 tablets by mouth daily.   ibuprofen  (ADVIL ) 600 MG tablet Take 1 tablet (600 mg total) by mouth every 8 (eight) hours as needed.   Multiple Vitamin (MULITIVITAMIN WITH MINERALS) TABS Take 1 tablet by mouth daily.   Omega-3 Fatty Acids (FISH OIL) 1200 MG CAPS Take 1,200 mg by mouth daily.   rosuvastatin  (CRESTOR ) 40 MG tablet Take 1 tablet (40 mg total) by mouth daily.   sodium chloride  (MURO  128) 2 % ophthalmic solution Place 1 drop into the right eye every 3 (three) hours as needed for eye irritation.   SUMAtriptan  (IMITREX ) 100 MG tablet Take 1 tablet (100 mg total) by mouth every 2 (two) hours as needed for migraine or headache. May repeat in 2 hours if needed.   Vitamin D , Cholecalciferol, 25 MCG (1000 UT) TABS Take 1,000 Units by mouth 2 (two) times daily.    No facility-administered encounter medications on file as of 08/10/2023.  :   Review of Systems:  Out of a complete 14 point review of systems, all are reviewed and negative with the exception of these symptoms as listed below:  Review of Systems  Neurological:        Patient is here alone for sleep consult. Patient states she has had a lot of retinal issues and procedures lately. She was told by her doctor that she didn't look like she slept well and she had some vessel leakage during an eye exam. She has a tendency to fall asleep on the sofa at night and feels her sleep is best. She was taking care of her mother who is now in skilled nursing so her personal fatigue and snoring seems to have improved. She states she has never had a sleep study. She denies any known family history sleep apnea but + snoring ESS 4 FSS 18    Objective:  Neurological Exam  Physical Exam Physical Examination:   Vitals:   08/10/23 1340  BP: 129/84  Pulse: 88    General Examination: The patient is a very pleasant 68 y.o. female in no acute distress. She appears well-developed and well-nourished and well groomed.   HEENT: Normocephalic, atraumatic, pupils are equal, round and reactive to light, extraocular tracking is good without limitation to gaze excursion or nystagmus noted.  Corrective eyeglasses in place.  Hearing  is grossly intact. Face is symmetric with normal facial animation. Speech is clear with no dysarthria noted. There is no hypophonia. There is no lip, neck/head, jaw or voice tremor. Neck is supple with full range of passive  and active motion. There are no carotid bruits on auscultation. Oropharynx exam reveals: mild mouth dryness, good dental hygiene and mild airway crowding, due to small airway entry, Mallampati class III, tonsils absent, tongue protrudes centrally and palate elevates symmetrically.    Chest: Clear to auscultation without wheezing, rhonchi or crackles noted.  Heart: S1+S2+0, regular and normal without murmurs, rubs or gallops noted.   Abdomen: Soft, non-tender and non-distended.  Extremities: There is nonpitting puffiness in the left distal lower extremity, unremarkable scars left knee from arthroscopic surgery in the past.  Mild varicose and spider veins in the right distal lower extremity.   Skin: Warm and dry without trophic changes noted.   Musculoskeletal: exam reveals no obvious joint deformities.   Neurologically:  Mental status: The patient is awake, alert and oriented in all 4 spheres. Her immediate and remote memory, attention, language skills and fund of knowledge are appropriate. There is no evidence of aphasia, agnosia, apraxia or anomia. Speech is clear with normal prosody and enunciation. Thought process is linear. Mood is normal and affect is normal.  Cranial nerves II - XII are as described above under HEENT exam.  Motor exam: Normal bulk, strength and tone is noted. There is no obvious action or resting tremor.  Fine motor skills and coordination: grossly intact.  Cerebellar testing: No dysmetria or intention tremor. There is no truncal or gait ataxia.  Sensory exam: intact to light touch in the upper and lower extremities.  Gait, station and balance: She stands easily. No veering to one side is noted. No leaning to one side is noted. Posture is age-appropriate and stance is narrow based. Gait shows normal stride length and normal pace. No problems turning are noted.   Assessment and Plan:  In summary, DACI STUBBE is a very pleasant 68 y.o.-year old female with an underlying  medical history of TIA, cataracts, history of UTI, osteopenia, hyperlipidemia, and macular degeneration, macular hole with status post surgery, whose history and physical exam are concerning for sleep disordered breathing, particularly obstructive sleep apnea (OSA). A laboratory attended sleep study is typically considered gold standard for evaluation of sleep disordered breathing.   I had a long chat with the patient about my findings and the diagnosis of sleep apnea, particularly OSA, its prognosis and treatment options. We talked about medical/conservative treatments, surgical interventions and non-pharmacological approaches for symptom control. I explained, in particular, the risks and ramifications of untreated moderate to severe OSA, especially with respect to developing cardiovascular disease down the road, including congestive heart failure (CHF), difficult to treat hypertension, cardiac arrhythmias (particularly A-fib), neurovascular complications including TIA, stroke and dementia. Even type 2 diabetes has, in part, been linked to untreated OSA. Symptoms of untreated OSA may include (but may not be limited to) daytime sleepiness, nocturia (i.e. frequent nighttime urination), memory problems, mood irritability and suboptimally controlled or worsening mood disorder such as depression and/or anxiety, lack of energy, lack of motivation, physical discomfort, as well as recurrent headaches, especially morning or nocturnal headaches. We talked about the importance of maintaining a healthy lifestyle and striving for healthy weight. In addition, we talked about the importance of striving for and maintaining good sleep hygiene. I recommended a sleep study at this time. I outlined the differences between a  laboratory attended sleep study which is considered more comprehensive and accurate over the option of a home sleep test (HST); the latter may lead to underestimation of sleep disordered breathing in some  instances and does not help with diagnosing upper airway resistance syndrome and is not accurate enough to diagnose primary central sleep apnea typically. I outlined possible surgical and non-surgical treatment options of OSA, including the use of a positive airway pressure (PAP) device (i.e. CPAP, AutoPAP/APAP or BiPAP in certain circumstances), a custom-made dental device (aka oral appliance, which would require a referral to a specialist dentist or orthodontist typically, and is generally speaking not considered for patients with full dentures or edentulous state), upper airway surgical options, such as traditional UPPP (which is not considered a first-line treatment) or the Inspire device (hypoglossal nerve stimulator, which would involve a referral for consultation with an ENT surgeon, after careful selection, following inclusion criteria - also not first-line treatment). I explained the PAP treatment option to the patient in detail, as this is generally considered first-line treatment.  The patient indicated that she would be willing to try PAP therapy, if the need arises. I explained the importance of being compliant with PAP treatment, not only for insurance purposes but primarily to improve patient's symptoms symptoms, and for the patient's long term health benefit, including to reduce Her cardiovascular risks longer-term.    We will pick up our discussion about the next steps and treatment options after testing.  We will keep her posted as to the test results by phone call and/or MyChart messaging where possible.  We will plan to follow-up in sleep clinic accordingly as well.  I answered all her questions today and the patient was in agreement.   I encouraged her to call with any interim questions, concerns, problems or updates or email us  through MyChart.  Generally speaking, sleep test authorizations may take up to 2 weeks, sometimes less, sometimes longer, the patient is encouraged to get in touch  with us  if they do not hear back from the sleep lab staff directly within the next 2 weeks.  Thank you very much for allowing me to participate in the care of this nice patient. If I can be of any further assistance to you please do not hesitate to call me at [phone].  Sincerely,   True Mar, MD, PhD

## 2023-08-10 NOTE — Patient Instructions (Signed)

## 2023-08-11 ENCOUNTER — Encounter: Payer: Self-pay | Admitting: Family Medicine

## 2023-08-11 ENCOUNTER — Other Ambulatory Visit: Payer: Self-pay

## 2023-08-11 DIAGNOSIS — E785 Hyperlipidemia, unspecified: Secondary | ICD-10-CM

## 2023-08-11 LAB — B. BURGDORFI ANTIBODIES BY WB
B burgdorferi IgG Abs (IB): NEGATIVE
B burgdorferi IgM Abs (IB): NEGATIVE
Lyme Disease 18 kD IgG: NONREACTIVE
Lyme Disease 23 kD IgG: NONREACTIVE
Lyme Disease 23 kD IgM: NONREACTIVE
Lyme Disease 28 kD IgG: NONREACTIVE
Lyme Disease 30 kD IgG: NONREACTIVE
Lyme Disease 39 kD IgG: NONREACTIVE
Lyme Disease 39 kD IgM: NONREACTIVE
Lyme Disease 41 kD IgG: REACTIVE — AB
Lyme Disease 41 kD IgM: NONREACTIVE
Lyme Disease 45 kD IgG: NONREACTIVE
Lyme Disease 58 kD IgG: REACTIVE — AB
Lyme Disease 66 kD IgG: NONREACTIVE
Lyme Disease 93 kD IgG: NONREACTIVE

## 2023-08-11 MED ORDER — ROSUVASTATIN CALCIUM 40 MG PO TABS: 40.0000 mg | ORAL_TABLET | Freq: Every day | ORAL | 2 refills | Status: DC

## 2023-08-13 ENCOUNTER — Ambulatory Visit: Payer: Self-pay | Admitting: Family Medicine

## 2023-08-14 ENCOUNTER — Telehealth: Payer: Self-pay | Admitting: Neurology

## 2023-08-14 NOTE — Telephone Encounter (Signed)
 [employer] NPSG pending.

## 2023-08-18 DIAGNOSIS — H35343 Macular cyst, hole, or pseudohole, bilateral: Secondary | ICD-10-CM | POA: Diagnosis not present

## 2023-08-18 DIAGNOSIS — G453 Amaurosis fugax: Secondary | ICD-10-CM | POA: Diagnosis not present

## 2023-08-18 DIAGNOSIS — Z961 Presence of intraocular lens: Secondary | ICD-10-CM | POA: Diagnosis not present

## 2023-08-18 DIAGNOSIS — H40051 Ocular hypertension, right eye: Secondary | ICD-10-CM | POA: Diagnosis not present

## 2023-08-20 NOTE — Telephone Encounter (Signed)
 Checked status on the portal it is still pending.

## 2023-08-25 NOTE — Telephone Encounter (Signed)
Checked the status it is still pending  

## 2023-08-26 DIAGNOSIS — H35341 Macular cyst, hole, or pseudohole, right eye: Secondary | ICD-10-CM | POA: Diagnosis not present

## 2023-08-26 DIAGNOSIS — H3581 Retinal edema: Secondary | ICD-10-CM | POA: Diagnosis not present

## 2023-08-26 DIAGNOSIS — Z8669 Personal history of other diseases of the nervous system and sense organs: Secondary | ICD-10-CM | POA: Diagnosis not present

## 2023-08-26 DIAGNOSIS — T85398A Other mechanical complication of other ocular prosthetic devices, implants and grafts, initial encounter: Secondary | ICD-10-CM | POA: Diagnosis not present

## 2023-08-26 DIAGNOSIS — Z48810 Encounter for surgical aftercare following surgery on the sense organs: Secondary | ICD-10-CM | POA: Diagnosis not present

## 2023-08-26 HISTORY — PX: OTHER SURGICAL HISTORY: SHX169

## 2023-08-27 DIAGNOSIS — S0501XD Injury of conjunctiva and corneal abrasion without foreign body, right eye, subsequent encounter: Secondary | ICD-10-CM | POA: Diagnosis not present

## 2023-08-31 NOTE — Addendum Note (Signed)
 Addended by: Jeremi Losito on: 08/31/2023 01:07 PM   Modules accepted: Orders

## 2023-08-31 NOTE — Telephone Encounter (Signed)
 HST ordered as pt's ins denied NPSG. Please notify pt.

## 2023-08-31 NOTE — Telephone Encounter (Signed)
 [employer] denied the NPSG see below for the denial reason. Do you want to order a HST?

## 2023-09-02 DIAGNOSIS — H4061X2 Glaucoma secondary to drugs, right eye, moderate stage: Secondary | ICD-10-CM | POA: Diagnosis not present

## 2023-09-02 DIAGNOSIS — H353113 Nonexudative age-related macular degeneration, right eye, advanced atrophic without subfoveal involvement: Secondary | ICD-10-CM | POA: Diagnosis not present

## 2023-09-02 DIAGNOSIS — H34231 Retinal artery branch occlusion, right eye: Secondary | ICD-10-CM | POA: Diagnosis not present

## 2023-09-02 DIAGNOSIS — H35341 Macular cyst, hole, or pseudohole, right eye: Secondary | ICD-10-CM | POA: Diagnosis not present

## 2023-09-02 DIAGNOSIS — S0501XD Injury of conjunctiva and corneal abrasion without foreign body, right eye, subsequent encounter: Secondary | ICD-10-CM | POA: Diagnosis not present

## 2023-09-02 DIAGNOSIS — H353122 Nonexudative age-related macular degeneration, left eye, intermediate dry stage: Secondary | ICD-10-CM | POA: Diagnosis not present

## 2023-09-02 DIAGNOSIS — H35073 Retinal telangiectasis, bilateral: Secondary | ICD-10-CM | POA: Diagnosis not present

## 2023-09-02 DIAGNOSIS — H18831 Recurrent erosion of cornea, right eye: Secondary | ICD-10-CM | POA: Diagnosis not present

## 2023-09-02 DIAGNOSIS — H35352 Cystoid macular degeneration, left eye: Secondary | ICD-10-CM | POA: Diagnosis not present

## 2023-09-08 ENCOUNTER — Encounter: Payer: Self-pay | Admitting: Oncology

## 2023-09-08 ENCOUNTER — Inpatient Hospital Stay

## 2023-09-08 ENCOUNTER — Inpatient Hospital Stay: Attending: Oncology | Admitting: Oncology

## 2023-09-08 VITALS — BP 119/65 | HR 71 | Temp 99.0°F | Resp 18 | Ht 62.01 in | Wt 122.4 lb

## 2023-09-08 DIAGNOSIS — D72829 Elevated white blood cell count, unspecified: Secondary | ICD-10-CM | POA: Diagnosis not present

## 2023-09-08 DIAGNOSIS — Z7982 Long term (current) use of aspirin: Secondary | ICD-10-CM | POA: Diagnosis not present

## 2023-09-08 DIAGNOSIS — D473 Essential (hemorrhagic) thrombocythemia: Secondary | ICD-10-CM | POA: Insufficient documentation

## 2023-09-08 DIAGNOSIS — R5383 Other fatigue: Secondary | ICD-10-CM | POA: Insufficient documentation

## 2023-09-08 DIAGNOSIS — E611 Iron deficiency: Secondary | ICD-10-CM | POA: Insufficient documentation

## 2023-09-08 DIAGNOSIS — Z8673 Personal history of transient ischemic attack (TIA), and cerebral infarction without residual deficits: Secondary | ICD-10-CM | POA: Diagnosis not present

## 2023-09-08 DIAGNOSIS — Z7902 Long term (current) use of antithrombotics/antiplatelets: Secondary | ICD-10-CM | POA: Diagnosis not present

## 2023-09-08 DIAGNOSIS — D75839 Thrombocytosis, unspecified: Secondary | ICD-10-CM

## 2023-09-08 LAB — CBC WITH DIFFERENTIAL/PLATELET
Abs Immature Granulocytes: 0.06 K/uL (ref 0.00–0.07)
Basophils Absolute: 0.1 K/uL (ref 0.0–0.1)
Basophils Relative: 1 %
Eosinophils Absolute: 0.2 K/uL (ref 0.0–0.5)
Eosinophils Relative: 2 %
HCT: 43.6 % (ref 36.0–46.0)
Hemoglobin: 14.1 g/dL (ref 12.0–15.0)
Immature Granulocytes: 1 %
Lymphocytes Relative: 17 %
Lymphs Abs: 2.1 K/uL (ref 0.7–4.0)
MCH: 30.3 pg (ref 26.0–34.0)
MCHC: 32.3 g/dL (ref 30.0–36.0)
MCV: 93.8 fL (ref 80.0–100.0)
Monocytes Absolute: 0.6 K/uL (ref 0.1–1.0)
Monocytes Relative: 5 %
Neutro Abs: 9.6 K/uL — ABNORMAL HIGH (ref 1.7–7.7)
Neutrophils Relative %: 74 %
Platelets: 684 K/uL — ABNORMAL HIGH (ref 150–400)
RBC: 4.65 MIL/uL (ref 3.87–5.11)
RDW: 14.6 % (ref 11.5–15.5)
WBC: 12.6 K/uL — ABNORMAL HIGH (ref 4.0–10.5)
nRBC: 0 % (ref 0.0–0.2)

## 2023-09-08 LAB — IRON AND TIBC
Iron: 55 ug/dL (ref 28–170)
Saturation Ratios: 16 % (ref 10.4–31.8)
TIBC: 343 ug/dL (ref 250–450)
UIBC: 288 ug/dL

## 2023-09-08 LAB — FERRITIN: Ferritin: 109 ng/mL (ref 11–307)

## 2023-09-08 LAB — SEDIMENTATION RATE: Sed Rate: 4 mm/h (ref 0–30)

## 2023-09-08 LAB — C-REACTIVE PROTEIN: CRP: 1.2 mg/dL — ABNORMAL HIGH (ref <1.0)

## 2023-09-08 NOTE — Assessment & Plan Note (Addendum)
 Patient has chronic thrombocytosis since at least 2015.  With recent slight increase to 600s. Differential to include iron deficiency, MPN versus secondary to inflammation  - Will obtain labs today-iron panel, ferritin -Will also obtain JAK2 mutation analysis - Will obtain inflammatory markers with an ESR, CRP -Discussed the risk of thrombosis with increased platelets.  Patient is currently taking aspirin .  Review to clinic in 2 weeks to discuss results and further management.

## 2023-09-08 NOTE — Assessment & Plan Note (Addendum)
 Patient has a very mild leukocytosis on recent labs Likely secondary to inflammation or reactive No B symptoms  - Will repeat labs today - Will also obtain inflammatory markers-ESR and CRP - If does not resolve, will consider further workup.

## 2023-09-08 NOTE — Patient Instructions (Signed)
 You were seen and examined today by Dr. Davonna. Dr. Davonna is a hematologist, meaning that he specializes in blood abnormalities. Dr. Davonna discussed your past medical history, family history of cancers/blood conditions and the events that led to you being here today.  You were referred to Dr. Davonna due to thrombocytosis (elevated white blood cell count).  Dr. Davonna has recommended additional labs today for further evaluation.  Follow-up as scheduled.

## 2023-09-08 NOTE — Progress Notes (Signed)
 [facility] at [facility]  HEMATOLOGY NEW VISIT  Duanne Butler DASEN, MD  REASON FOR REFERRAL: Thrombocytosis  HISTORY OF PRESENT ILLNESS: Kylie Duffy 68 y.o. female referred for thrombocytosis.  Patient has a past medical history of TIA, hyperlipidemia, macular edema.  Patient has chronically high platelets for at least 10 years now with recent slight increase.  In April, she experienced an episode of suspected TIA characterized by sudden vision loss in her right eye, this episode lasted between 2 and 3 minutes.  Following this, she was placed on aspirin  and Plavix .  She continues to feel fatigued since the TIA episode.  No other complaints at this time.  No fever, chills, loss of appetite or loss of weight.  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 She was recently diagnosed of possibility of sleep apnea and is trying to get a home sleep study done.  She is not a smoker, does not use alcohol.  She denies any hematological conditions in her family history.  I have reviewed the past medical history, past surgical history, social history and family history with the patient   ALLERGIES:  has no known allergies.  MEDICATIONS:  Current Outpatient Medications  Medication Sig Dispense Refill   aspirin  EC 81 MG tablet Take 1 tablet (81 mg total) by mouth daily. Swallow whole. 30 tablet 12   b complex vitamins capsule Take 1 capsule by mouth daily.     Calcium  Carbonate-Vitamin D  (CALCIUM  600 + D PO) Take 4 tablets by mouth daily. Calcifood. Pt is taking 250 mg per day     Cholecalciferol (VITAMIN D3 PO) Take 2,000 Units by mouth 2 (two) times daily.     COMBIGAN 0.2-0.5 % ophthalmic solution Place 1 drop into the right eye 2 (two) times daily.     ibuprofen  (ADVIL ) 600 MG tablet Take 1 tablet (600 mg total) by mouth every 8 (eight) hours as needed. 30 tablet 0   Multiple Vitamin (MULITIVITAMIN WITH  MINERALS) TABS Take 1 tablet by mouth daily.     Omega-3 Fatty Acids (FISH OIL) 1200 MG CAPS Take 1,200 mg by mouth daily.     rosuvastatin  (CRESTOR ) 40 MG tablet Take 1 tablet (40 mg total) by mouth daily. 90 tablet 2   sodium chloride  (MURO 128) 2 % ophthalmic solution Place 1 drop into the right eye every 3 (three) hours as needed for eye irritation.     sodium chloride  (MURO 128) 5 % ophthalmic ointment Place 1 Application into the right eye at bedtime.     SUMAtriptan  (IMITREX ) 100 MG tablet Take 1 tablet (100 mg total) by mouth every 2 (two) hours as needed for migraine or headache. May repeat in 2 hours if needed. 10 tablet 1   No current facility-administered medications for this visit.     REVIEW OF SYSTEMS:   Constitutional: Denies fevers, chills or night sweats Eyes: Denies blurriness of vision Ears, nose, mouth, throat, and face: Denies mucositis or sore throat Respiratory: Denies cough, dyspnea or wheezes Cardiovascular: Denies palpitation, chest discomfort or lower extremity swelling Gastrointestinal:  Denies nausea, heartburn or change in bowel habits Skin: Denies abnormal skin rashes Lymphatics: Denies new lymphadenopathy or easy bruising Neurological:Denies numbness, tingling or new weaknesses Behavioral/Psych: Mood is stable, no new changes  All other systems were reviewed with the patient and are negative.  PHYSICAL EXAMINATION:   Vitals:  09/08/23 0842  BP: 119/65  Pulse: 71  Resp: 18  Temp: 99 F (37.2 C)  SpO2: 97%    GENERAL:alert, no distress and comfortable SKIN: skin color, texture, turgor are normal, no rashes or significant lesions LYMPH:  no palpable lymphadenopathy in the cervical, axillary or inguinal LUNGS: clear to auscultation and percussion with normal breathing effort HEART: regular rate & rhythm and no murmurs and no lower extremity edema ABDOMEN:abdomen soft, non-tender and normal bowel sounds Musculoskeletal:no cyanosis of digits and  no clubbing  NEURO: alert & oriented x 3 with fluent speech, no focal motor/sensory deficits  LABORATORY DATA:  I have reviewed the data as listed   Latest Reference Range & Units 07/30/23 09:12  WBC 3.8 - 10.8 Thousand/uL 10.9 (H)  RBC 3.80 - 5.10 Million/uL 4.81  Hemoglobin 11.7 - 15.5 g/dL 85.5  HCT 64.9 - 54.9 % 45.0  MCV 80.0 - 100.0 fL 93.6  MCH 27.0 - 33.0 pg 29.9  MCHC 32.0 - 36.0 g/dL 67.9  RDW 88.9 - 84.9 % 13.7  Platelets 140 - 400 Thousand/uL 699 (H)  MPV 7.5 - 12.5 fL 10.1  Neutrophils % 70.4  Monocytes Relative % 4.9  Eosinophil % 1.8  Basophil % 0.9  NEUT# 1,500 - 7,800 cells/uL 7,674  Total Lymphocyte % 22.0  Eosinophils Absolute 15 - 500 cells/uL 196  Basophils Absolute 0 - 200 cells/uL 98  Absolute Monocytes 200 - 950 cells/uL 534  (H): Data is abnormally high    Chemistry      Component Value Date/Time   NA 139 07/30/2023 0912   K 4.8 07/30/2023 0912   CL 101 07/30/2023 0912   CO2 29 07/30/2023 0912   BUN 18 07/30/2023 0912   CREATININE 0.65 07/30/2023 0912      Component Value Date/Time   CALCIUM  9.4 07/30/2023 0912   ALKPHOS 60 05/11/2023 1146   AST 25 07/30/2023 0912   ALT 27 07/30/2023 0912   BILITOT 0.5 07/30/2023 0912      Latest Reference Range & Units 01/07/14 09:14 05/07/15 09:01 06/25/16 08:11 07/03/17 11:45 01/25/18 08:34 07/13/18 09:16 01/13/19 08:31 07/13/19 08:26 02/16/20 10:49 05/28/20 09:58 07/13/20 08:40 07/18/21 08:18 07/28/22 10:00 05/11/23 11:46 07/30/23 09:12  Platelets 140 - 400 Thousand/uL 433 (H) 423 (H) 427 (H) 540 (H) 518 (H) 481 (H) 581 (H) 502 (H) 595 (H) 522 (H) 557 (H) 546 (H) 592 (H) 733 (H) 699 (H)  (H): Data is abnormally high   RADIOGRAPHIC STUDIES: I have personally reviewed the radiological images as listed and agreed with the findings in the report.  None relevant to review  ASSESSMENT & PLAN:  Patient is a 68 y.o. female referred for thrombocytosis  Assessment & Plan Thrombocytosis Patient has  chronic thrombocytosis since at least 2015.  With recent slight increase to 600s. Differential to include iron deficiency, MPN versus secondary to inflammation  - Will obtain labs today-iron panel, ferritin -Will also obtain JAK2 mutation analysis - Will obtain inflammatory markers with an ESR, CRP -Discussed the risk of thrombosis with increased platelets.  Patient is currently taking aspirin .  Review to clinic in 2 weeks to discuss results and further management. Leukocytosis, unspecified type Patient has a very mild leukocytosis on recent labs Likely secondary to inflammation or reactive No B symptoms  - Will repeat labs today - Will also obtain inflammatory markers-ESR and CRP - If does not resolve, will consider further workup.   Orders Placed This Encounter  Procedures   JAK2 V617F rfx  CALR/MPL/E12-15    Standing Status:   Future    Number of Occurrences:   1    Expected Date:   09/08/2023    Expiration Date:   12/07/2023   Sedimentation rate    Standing Status:   Future    Number of Occurrences:   1    Expected Date:   09/08/2023    Expiration Date:   12/07/2023   C-reactive protein    Standing Status:   Future    Number of Occurrences:   1    Expected Date:   09/08/2023    Expiration Date:   12/07/2023   CBC with Differential    Standing Status:   Future    Number of Occurrences:   1    Expected Date:   09/08/2023    Expiration Date:   12/07/2023   Iron and TIBC ([facility] ONLY)    Standing Status:   Future    Number of Occurrences:   1    Expected Date:   09/08/2023    Expiration Date:   12/07/2023   Ferritin    Standing Status:   Future    Number of Occurrences:   1    Expected Date:   09/08/2023    Expiration Date:   12/07/2023    The total time spent in the appointment was 40 minutes encounter with patients including review of chart and various tests results, discussions about plan of care and coordination of care plan   All questions  were answered. The patient knows to call the clinic with any problems, questions or concerns. No barriers to learning was detected.   Mickiel Dry, MD 8/12/202512:23 PM

## 2023-09-14 ENCOUNTER — Encounter: Payer: Self-pay | Admitting: Adult Health

## 2023-09-14 ENCOUNTER — Ambulatory Visit (INDEPENDENT_AMBULATORY_CARE_PROVIDER_SITE_OTHER): Admitting: Adult Health

## 2023-09-14 VITALS — BP 120/78 | HR 78 | Ht 62.5 in | Wt 120.5 lb

## 2023-09-14 DIAGNOSIS — Z1331 Encounter for screening for depression: Secondary | ICD-10-CM

## 2023-09-14 DIAGNOSIS — Z1211 Encounter for screening for malignant neoplasm of colon: Secondary | ICD-10-CM | POA: Diagnosis not present

## 2023-09-14 DIAGNOSIS — Z90710 Acquired absence of both cervix and uterus: Secondary | ICD-10-CM | POA: Diagnosis not present

## 2023-09-14 DIAGNOSIS — Z01419 Encounter for gynecological examination (general) (routine) without abnormal findings: Secondary | ICD-10-CM

## 2023-09-14 LAB — HEMOCCULT GUIAC POC 1CARD (OFFICE): Fecal Occult Blood, POC: NEGATIVE

## 2023-09-14 NOTE — Progress Notes (Signed)
 Patient ID: Kylie Duffy, female   DOB: 10-24-1955, 68 y.o.   MRN: [id number] History of Present Illness: Kylie Duffy is a 68 year old white female, married, sp hysterectomy in for a well woman gyn exam. She has had retinal surgery and had TIA in April and has had to place mom in skilled nursing facility in [place]. And she just saw hemologist about elevated platelets.   PCP is Dr Duanne.   Current Medications, Allergies, Past Medical History, Past Surgical History, Family History and Social History were reviewed in [name] record.     Review of Systems: Patient denies any headaches, hearing loss, fatigue, blurred vision, shortness of breath, chest pain, abdominal pain, problems with bowel movements, urination, or intercourse(not having often).. No joint pain or mood swings.     Physical Exam:BP 120/78 (BP Location: Left Arm, Patient Position: Sitting, Cuff Size: Normal)   Pulse 78   Ht 5' 2.5 (1.588 m)   Wt 120 lb 8 oz (54.7 kg)   BMI 21.69 kg/m   General:  Well developed, well nourished, no acute distress Skin:  Warm and dry Neck:  Midline trachea, normal thyroid , good ROM, no lymphadenopathy, no carotid bruits heard  Lungs; Clear to auscultation bilaterally Breast:  No dominant palpable mass, retraction, or nipple discharge Cardiovascular: Regular rate and rhythm Abdomen:  Soft, non tender, no hepatosplenomegaly Pelvic:  External genitalia is normal in appearance, no lesions.  The vagina is pale and atrophic. Urethra has no lesions or masses. The cervix and uterus are absent.  No adnexal masses or tenderness noted.Bladder is non tender, no masses felt. Rectal: Good sphincter tone, no polyps, or hemorrhoids felt.  Hemoccult negative. Extremities/musculoskeletal:  No swelling, + varicosities noted, no clubbing or cyanosis Psych:  No mood changes, alert and cooperative,seems happy AA is 0    09/14/2023   11:40 AM 09/08/2023    8:49 AM 08/07/2023   11:15 AM   Depression screen PHQ 2/9  Decreased Interest 0 0 0  Down, Depressed, Hopeless 0 0 0  PHQ - 2 Score 0 0 0  Altered sleeping 0    Tired, decreased energy 0    Change in appetite 0    Feeling bad or failure about yourself  0    Trouble concentrating 0    Moving slowly or fidgety/restless 0    Suicidal thoughts 0    PHQ-9 Score 0         09/14/2023   11:41 AM 08/07/2023   11:15 AM 07/28/2022    9:34 AM 06/27/2022    9:57 AM  GAD 7 : Generalized Anxiety Score  Nervous, Anxious, on Edge 0 0 0 0  Control/stop worrying 0 0 0 0  Worry too much - different things 0  0 0  Trouble relaxing 0  0 0  Restless 0  0 0  Easily annoyed or irritable 0  0 0  Afraid - awful might happen 0  0 0  Total GAD 7 Score 0  0 0  Anxiety Difficulty  Not difficult at all      Upstream - 09/14/23 1138       Pregnancy Intention Screening   Does the patient want to become pregnant in the next year? N/A    Does the patient's partner want to become pregnant in the next year? N/A    Would the patient like to discuss contraceptive options today? N/A      Contraception Wrap Up   Current Method  Female Sterilization   hyst   End Method Female Sterilization   hyst   Contraception Counseling Provided No         Examination chaperoned by Kylie Salt LPN     Impression and plan: 1. Encounter for well woman exam with routine gynecological exam (Primary) Physical in 2 years  Mammogram was negative 07/15/23 Colonoscopy per GI Labs with PCP   2. Encounter for screening fecal occult blood testing Hemoccult was negative  - POCT occult blood stool  3. S/P hysterectomy

## 2023-09-20 NOTE — Progress Notes (Incomplete)
 [facility] at [facility]  HEMATOLOGY FOLLOW-UP VISIT  Kylie Butler DASEN, MD  REASON FOR FOLLOW-UP: Thrombocytosis  ASSESSMENT & PLAN:  Patient is a 68 y.o. Duffy following for thrombocytosis  Assessment & Plan Thrombocytosis Patient has chronic thrombocytosis since at least 2015.  With recent slight increase to 600s. Differential to include iron deficiency, MPN versus secondary to inflammation  - Labs reviewed today: CBC: WBC: 12.6, platelets: 684, hemoglobin: 14.1.  CRP: 1.2 elevated, iron panel: TSAT: 16, ferritin: 109, Sed rate: 4 Will obtain labs today-iron panel, ferritin -Will also obtain JAK2 mutation analysis - Will obtain inflammatory markers with an ESR, CRP -Discussed the risk of thrombosis with increased platelets.  Patient is currently taking aspirin .  Review to clinic in 2 weeks to discuss results and further management.    No orders of the defined types were placed in this encounter.   The total time spent in the appointment was {CHL ONC TIME VISIT - DTPQU:[id number]} encounter with patients including review of chart and various tests results, discussions about plan of care and coordination of care plan   All questions were answered. The patient knows to call the clinic with any problems, questions or concerns. No barriers to learning was detected.   Kylie Duffy,acting as a Neurosurgeon for Mickiel Dry, MD.,have documented all relevant documentation on the behalf of Mickiel Dry, MD,as directed by  Mickiel Dry, MD while in the presence of Mickiel Dry, MD.  ***   Mickiel Dry, MD 8/25/202510:59 PM   SUMMARY OF HEMATOLOGIC HISTORY: ***   INTERVAL HISTORY: Kylie Duffy following for thrombocytosis. Patient was accompanied by    I have reviewed the past medical history, past surgical history, social history and family history with the patient   ALLERGIES:  has no known  allergies.  MEDICATIONS:  Current Outpatient Medications  Medication Sig Dispense Refill   aspirin  EC 81 MG tablet Take 1 tablet (81 mg total) by mouth daily. Swallow whole. 30 tablet 12   b complex vitamins capsule Take 1 capsule by mouth daily.     Calcium  Carbonate-Vitamin D  (CALCIUM  600 + D PO) Take 4 tablets by mouth daily. Calcifood. Pt is taking 250 mg per day     Cholecalciferol (VITAMIN D3 PO) Take 2,000 Units by mouth 2 (two) times daily.     COMBIGAN 0.2-0.5 % ophthalmic solution Place 1 drop into the right eye 2 (two) times daily.     ibuprofen  (ADVIL ) 600 MG tablet Take 1 tablet (600 mg total) by mouth every 8 (eight) hours as needed. 30 tablet 0   Multiple Vitamin (MULITIVITAMIN WITH MINERALS) TABS Take 1 tablet by mouth daily.     Omega-3 Fatty Acids (FISH OIL) 1200 MG CAPS Take 1,200 mg by mouth daily.     rosuvastatin  (CRESTOR ) 40 MG tablet Take 1 tablet (40 mg total) by mouth daily. 90 tablet 2   sodium chloride  (MURO 128) 2 % ophthalmic solution Place 1 drop into the right eye every 3 (three) hours as needed for eye irritation.     sodium chloride  (MURO 128) 5 % ophthalmic ointment Place 1 Application into the right eye at bedtime.     SUMAtriptan  (IMITREX ) 100 MG tablet Take 1 tablet (100 mg total) by mouth every 2 (two) hours as needed for migraine or headache. May repeat in 2 hours if needed. 10 tablet 1   No current facility-administered medications for this visit.     REVIEW OF  SYSTEMS:   Constitutional: Denies fevers, chills or night sweats Eyes: Denies blurriness of vision Ears, nose, mouth, throat, and face: Denies mucositis or sore throat Respiratory: Denies cough, dyspnea or wheezes Cardiovascular: Denies palpitation, chest discomfort or lower extremity swelling Gastrointestinal:  Denies nausea, heartburn or change in bowel habits Skin: Denies abnormal skin rashes Lymphatics: Denies new lymphadenopathy or easy bruising Neurological:Denies numbness, tingling  or new weaknesses Behavioral/Psych: Mood is stable, no new changes  All other systems were reviewed with the patient and are negative.  PHYSICAL EXAMINATION:   There were no vitals filed for this visit.  GENERAL:alert, no distress and comfortable SKIN: skin color, texture, turgor are normal, no rashes or significant lesions LYMPH:  no palpable lymphadenopathy in the cervical, axillary or inguinal LUNGS: clear to auscultation and percussion with normal breathing effort HEART: regular rate & rhythm and no murmurs and no lower extremity edema ABDOMEN:abdomen soft, non-tender and normal bowel sounds Musculoskeletal:no cyanosis of digits and no clubbing  NEURO: alert & oriented x 3 with fluent speech  LABORATORY DATA:  I have reviewed the data as listed  Lab Results  Component Value Date   WBC 12.6 (H) 09/08/2023   NEUTROABS 9.6 (H) 09/08/2023   HGB 14.1 09/08/2023   HCT 43.6 09/08/2023   MCV 93.8 09/08/2023   PLT 684 (H) 09/08/2023     No results found for: SPEP, UPEP   @LASTCHEMISTRY @   RADIOGRAPHIC STUDIES: I have personally reviewed the radiological images as listed and agreed with the findings in the report.  MM 3D SCREENING MAMMOGRAM BILATERAL BREAST CLINICAL DATA:  Screening.  EXAM: DIGITAL SCREENING BILATERAL MAMMOGRAM WITH TOMOSYNTHESIS AND CAD  TECHNIQUE: Bilateral screening digital craniocaudal and mediolateral oblique mammograms were obtained. Bilateral screening digital breast tomosynthesis was performed. The images were evaluated with computer-aided detection.  COMPARISON:  Previous exam(s).  ACR Breast Density Category c: The breasts are heterogeneously dense, which may obscure small masses.  FINDINGS: There are no findings suspicious for malignancy.  IMPRESSION: No mammographic evidence of malignancy. A result letter of this screening mammogram will be mailed directly to the patient.  RECOMMENDATION: Screening mammogram in one year.  (Code:SM-B-01Y)  BI-RADS CATEGORY  1: Negative.  Electronically Signed   By: Toribio Agreste M.D.   On: 07/17/2023 08:17

## 2023-09-21 NOTE — Assessment & Plan Note (Deleted)
 Patient has chronic thrombocytosis since at least 2015.  With recent slight increase to 600s. Differential to include iron deficiency, MPN versus secondary to inflammation  - Labs reviewed today: CBC: WBC: 12.6, platelets: 684, hemoglobin: 14.1.  CRP: 1.2 elevated, iron panel: TSAT: 16, ferritin: 109, Sed rate: 4 Will obtain labs today-iron panel, ferritin -Will also obtain JAK2 mutation analysis - Will obtain inflammatory markers with an ESR, CRP -Discussed the risk of thrombosis with increased platelets.  Patient is currently taking aspirin .  Review to clinic in 2 weeks to discuss results and further management.

## 2023-09-22 ENCOUNTER — Inpatient Hospital Stay: Admitting: Oncology

## 2023-09-22 DIAGNOSIS — D75839 Thrombocytosis, unspecified: Secondary | ICD-10-CM

## 2023-09-23 DIAGNOSIS — H35073 Retinal telangiectasis, bilateral: Secondary | ICD-10-CM | POA: Diagnosis not present

## 2023-09-23 DIAGNOSIS — H18831 Recurrent erosion of cornea, right eye: Secondary | ICD-10-CM | POA: Diagnosis not present

## 2023-09-23 DIAGNOSIS — H35352 Cystoid macular degeneration, left eye: Secondary | ICD-10-CM | POA: Diagnosis not present

## 2023-09-23 DIAGNOSIS — H34231 Retinal artery branch occlusion, right eye: Secondary | ICD-10-CM | POA: Diagnosis not present

## 2023-09-23 DIAGNOSIS — H35341 Macular cyst, hole, or pseudohole, right eye: Secondary | ICD-10-CM | POA: Diagnosis not present

## 2023-09-23 DIAGNOSIS — H353113 Nonexudative age-related macular degeneration, right eye, advanced atrophic without subfoveal involvement: Secondary | ICD-10-CM | POA: Diagnosis not present

## 2023-09-23 DIAGNOSIS — H4061X2 Glaucoma secondary to drugs, right eye, moderate stage: Secondary | ICD-10-CM | POA: Diagnosis not present

## 2023-09-23 DIAGNOSIS — H353122 Nonexudative age-related macular degeneration, left eye, intermediate dry stage: Secondary | ICD-10-CM | POA: Diagnosis not present

## 2023-09-23 DIAGNOSIS — S0501XD Injury of conjunctiva and corneal abrasion without foreign body, right eye, subsequent encounter: Secondary | ICD-10-CM | POA: Diagnosis not present

## 2023-09-23 LAB — JAK2 V617F RFX CALR/MPL/E12-15: JAK2 V617F %: 16.9 %

## 2023-09-29 NOTE — Progress Notes (Unsigned)
 [facility] at [facility]  HEMATOLOGY FOLLOW-UP VISIT  Duanne Butler DASEN, MD  REASON FOR FOLLOW-UP: Thrombocytosis  ASSESSMENT & PLAN:  Patient is a 68 y.o. female following for thrombocytosis  Assessment & Plan Thrombocytosis Patient has chronic thrombocytosis since at least 2015.  With recent slight increase to 600s. Differential to include iron deficiency, MPN versus secondary to inflammation JAK2 mutation: positive  - Labs reviewed today: CBC: WBC: 12.6, platelets: 684, hemoglobin: 14.1.  CRP: 1.2 elevated, iron panel: TSAT: 16, ferritin: 109, Sed rate: 4 -Discussed the risk of thrombosis with increased platelets.  Patient is currently taking aspirin . -JAK2 mutation positivity suggests a myeloproliferative neoplasm, likely polycythemia vera or essential thrombocythemia, with thrombocytosis. Bone marrow biopsy needed for diagnosis and treatment guidance.  - Will refer for IR guided bone marrow biopsy - Will obtain labs today including BCR-ABL to rule out chronic myeloid leukemia, erythropoietin , LDH, uric acid. - Continue taking aspirin   Review to clinic 1 week after biopsy to discuss results and further management Iron deficiency Low saturation ratio of 16 indicates mild iron deficiency.   - Start iron supplementation with 65 mg or 325 mg every other day.Discussed constipation risk and absorption enhancement strategies. - Take Miralax if constipation occurs. - Take iron with orange juice, avoid coffee with iron.    Orders Placed This Encounter  Procedures   CT BONE MARROW BIOPSY & ASPIRATION    FLOW cytometry; cytogenetics; MPN FISH    Standing Status:   Future    Expected Date:   10/07/2023    Expiration Date:   09/29/2024    Reason for Exam (SYMPTOM  OR DIAGNOSIS REQUIRED):   r/o myelofibrosis    Preferred location?:   [facility]   BCR-ABL1, CML/ALL, PCR, QUANT    Standing Status:   Future    Number of Occurrences:   1    Expected  Date:   09/30/2023    Expiration Date:   12/29/2023   Lactate dehydrogenase    Standing Status:   Future    Number of Occurrences:   1    Expected Date:   09/30/2023    Expiration Date:   12/29/2023   Uric acid    Standing Status:   Future    Number of Occurrences:   1    Expected Date:   09/30/2023    Expiration Date:   12/29/2023   Erythropoietin     Standing Status:   Future    Number of Occurrences:   1    Expected Date:   09/30/2023    Expiration Date:   12/29/2023    The total time spent in the appointment was 30 minutes encounter with patients including review of chart and various tests results, discussions about plan of care and coordination of care plan   All questions were answered. The patient knows to call the clinic with any problems, questions or concerns. No barriers to learning was detected.  Mickiel Dry, MD 9/3/20254:40 PM   SUMMARY OF HEMATOLOGIC HISTORY: Patient has chronic thrombocytosis since at least 2015.   -JAK2 mutation: positive - Iron panel: TSAT: 16, ferritin: 109 - CRP: 1.2-elevated, sed rate: 4-Normal - LDH: 220, uric acid: 3.5   INTERVAL HISTORY: Niels DELENA Dibbles 68 y.o. female following for thrombocytosis. Patient was accompanied by her husband today.  We discussed the JAK2 positivity on her lab test and its implications.  Discussed the role of bone marrow biopsy patient is in agreement with this plan.  Patient has ongoing visual abnormality she has no other.  Denies fatigue, night sweats, weight loss, loss of appetite.  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   I have reviewed the past medical history, past surgical history, social history and family history with the patient   ALLERGIES:  has no known allergies.  MEDICATIONS:  Current Outpatient Medications  Medication Sig Dispense Refill   aspirin  EC 81 MG tablet Take 1 tablet (81 mg total) by mouth daily. Swallow whole. 30 tablet 12    b complex vitamins capsule Take 1 capsule by mouth daily.     CALCIUM  PO Take 760 mg by mouth daily.     Cholecalciferol (VITAMIN D3 PO) Take 2,000 Units by mouth 2 (two) times daily.     COMBIGAN 0.2-0.5 % ophthalmic solution Place 1 drop into the right eye 2 (two) times daily.     ibuprofen  (ADVIL ) 600 MG tablet Take 1 tablet (600 mg total) by mouth every 8 (eight) hours as needed. 30 tablet 0   Multiple Vitamin (MULITIVITAMIN WITH MINERALS) TABS Take 1 tablet by mouth daily.     Omega-3 Fatty Acids (FISH OIL) 1200 MG CAPS Take 1,200 mg by mouth daily.     rosuvastatin  (CRESTOR ) 40 MG tablet Take 1 tablet (40 mg total) by mouth daily. 90 tablet 2   sodium chloride  (MURO 128) 2 % ophthalmic solution Place 1 drop into the right eye every 3 (three) hours as needed for eye irritation.     sodium chloride  (MURO 128) 5 % ophthalmic ointment Place 1 Application into the right eye at bedtime.     SUMAtriptan  (IMITREX ) 100 MG tablet Take 1 tablet (100 mg total) by mouth every 2 (two) hours as needed for migraine or headache. May repeat in 2 hours if needed. 10 tablet 1   No current facility-administered medications for this visit.     REVIEW OF SYSTEMS:   Constitutional: Denies fevers, chills or night sweats Eyes: Denies blurriness of vision Ears, nose, mouth, throat, and face: Denies mucositis or sore throat Respiratory: Denies cough, dyspnea or wheezes Cardiovascular: Denies palpitation, chest discomfort or lower extremity swelling Gastrointestinal:  Denies nausea, heartburn or change in bowel habits Skin: Denies abnormal skin rashes Lymphatics: Denies new lymphadenopathy or easy bruising Neurological:Denies numbness, tingling or new weaknesses Behavioral/Psych: Mood is stable, no new changes  All other systems were reviewed with the patient and are negative.  PHYSICAL EXAMINATION:   Vitals:   09/30/23 1420  BP: 128/80  Pulse: 96  Resp: 18  Temp: (!) 97.3 F (36.3 C)  SpO2: 95%     GENERAL:alert, no distress and comfortable SKIN: skin color, texture, turgor are normal, no rashes or significant lesions LYMPH:  no palpable lymphadenopathy in the cervical, axillary or inguinal LUNGS: clear to auscultation and percussion with normal breathing effort HEART: regular rate & rhythm and no murmurs and no lower extremity edema ABDOMEN:abdomen soft, non-tender and normal bowel sounds Musculoskeletal:no cyanosis of digits and no clubbing  NEURO: alert & oriented x 3 with fluent speech  LABORATORY DATA:  I have reviewed the data as listed  Lab Results  Component Value Date   WBC 12.6 (H) 09/08/2023   NEUTROABS 9.6 (H) 09/08/2023   HGB 14.1 09/08/2023   HCT 43.6 09/08/2023   MCV 93.8 09/08/2023   PLT 684 (H) 09/08/2023       Chemistry  Component Value Date/Time   NA 139 07/30/2023 0912   K 4.8 07/30/2023 0912   CL 101 07/30/2023 0912   CO2 29 07/30/2023 0912   BUN 18 07/30/2023 0912   CREATININE 0.65 07/30/2023 0912      Component Value Date/Time   CALCIUM  9.4 07/30/2023 0912   ALKPHOS 60 05/11/2023 1146   AST 25 07/30/2023 0912   ALT 27 07/30/2023 0912   BILITOT 0.5 07/30/2023 0912      09/08/23 09:20  JAK2 V617F rfx CALR/MPL/E12-15 Positive  (C): Corrected Rpt: View report in Results Review for more information   Latest Reference Range & Units 09/08/23 09:19  CRP <1.0 mg/dL 1.2 (H)  (H): Data is abnormally high   Latest Reference Range & Units 09/30/23 15:19  Uric Acid, Serum 2.5 - 7.1 mg/dL 3.5  LDH 98 - 807 U/L 779 (H)  (H): Data is abnormally high   Latest Reference Range & Units 09/08/23 09:20  Sed Rate 0 - 30 mm/hr 4    RADIOGRAPHIC STUDIES: I have personally reviewed the radiological images as listed and agreed with the findings in the report.  MM 3D SCREENING MAMMOGRAM BILATERAL BREAST CLINICAL DATA:  Screening.  EXAM: DIGITAL SCREENING BILATERAL MAMMOGRAM WITH TOMOSYNTHESIS AND CAD  TECHNIQUE: Bilateral screening  digital craniocaudal and mediolateral oblique mammograms were obtained. Bilateral screening digital breast tomosynthesis was performed. The images were evaluated with computer-aided detection.  COMPARISON:  Previous exam(s).  ACR Breast Density Category c: The breasts are heterogeneously dense, which may obscure small masses.  FINDINGS: There are no findings suspicious for malignancy.  IMPRESSION: No mammographic evidence of malignancy. A result letter of this screening mammogram will be mailed directly to the patient.  RECOMMENDATION: Screening mammogram in one year. (Code:[id number]-B-01Y)  BI-RADS CATEGORY  1: Negative.  Electronically Signed   By: Toribio Agreste M.D.   On: 07/17/2023 08:17

## 2023-09-29 NOTE — Assessment & Plan Note (Addendum)
 Patient has chronic thrombocytosis since at least 2015.  With recent slight increase to 600s. Differential to include iron deficiency, MPN versus secondary to inflammation  - Labs reviewed today: CBC: WBC: 12.6, platelets: 684, hemoglobin: 14.1.  CRP: 1.2 elevated, iron panel: TSAT: 16, ferritin: 109, Sed rate: 4 Will obtain labs today-iron panel, ferritin -Will also obtain JAK2 mutation analysis - Will obtain inflammatory markers with an ESR, CRP -Discussed the risk of thrombosis with increased platelets.  Patient is currently taking aspirin .  Review to clinic in 2 weeks to discuss results and further management.

## 2023-09-30 ENCOUNTER — Inpatient Hospital Stay

## 2023-09-30 ENCOUNTER — Inpatient Hospital Stay: Attending: Oncology | Admitting: Oncology

## 2023-09-30 VITALS — BP 128/80 | HR 96 | Temp 97.3°F | Resp 18 | Wt 121.7 lb

## 2023-09-30 DIAGNOSIS — R634 Abnormal weight loss: Secondary | ICD-10-CM | POA: Insufficient documentation

## 2023-09-30 DIAGNOSIS — D509 Iron deficiency anemia, unspecified: Secondary | ICD-10-CM | POA: Insufficient documentation

## 2023-09-30 DIAGNOSIS — D72829 Elevated white blood cell count, unspecified: Secondary | ICD-10-CM | POA: Insufficient documentation

## 2023-09-30 DIAGNOSIS — Z7982 Long term (current) use of aspirin: Secondary | ICD-10-CM | POA: Diagnosis not present

## 2023-09-30 DIAGNOSIS — D473 Essential (hemorrhagic) thrombocythemia: Secondary | ICD-10-CM | POA: Insufficient documentation

## 2023-09-30 DIAGNOSIS — Z1589 Genetic susceptibility to other disease: Secondary | ICD-10-CM

## 2023-09-30 DIAGNOSIS — D75839 Thrombocytosis, unspecified: Secondary | ICD-10-CM

## 2023-09-30 DIAGNOSIS — E611 Iron deficiency: Secondary | ICD-10-CM | POA: Diagnosis not present

## 2023-09-30 DIAGNOSIS — R5383 Other fatigue: Secondary | ICD-10-CM | POA: Diagnosis not present

## 2023-09-30 LAB — LACTATE DEHYDROGENASE: LDH: 220 U/L — ABNORMAL HIGH (ref 98–192)

## 2023-09-30 LAB — URIC ACID: Uric Acid, Serum: 3.5 mg/dL (ref 2.5–7.1)

## 2023-09-30 NOTE — Assessment & Plan Note (Signed)
 Low saturation ratio of 16 indicates mild iron deficiency.   - Start iron supplementation with 65 mg or 325 mg every other day.Discussed constipation risk and absorption enhancement strategies. - Take Miralax if constipation occurs. - Take iron with orange juice, avoid coffee with iron.

## 2023-09-30 NOTE — Patient Instructions (Signed)
 [facility] at [facility] Discharge Instructions   You were seen and examined today by Dr. Davonna.  She reviewed the results of your lab work. We will arrange for you to have a bone marrow biopsy. We will also do additional lab work today.    We will see you back after the biopsy to discuss the results.   Return as scheduled.    Thank you for choosing [facility] at [facility] to provide your oncology and hematology care.  To afford each patient quality time with our provider, please arrive at least 15 minutes before your scheduled appointment time.   If you have a lab appointment with the Cancer Center please come in thru the Main Entrance and check in at the main information desk.  You need to re-schedule your appointment should you arrive 10 or more minutes late.  We strive to give you quality time with our providers, and arriving late affects you and other patients whose appointments are after yours.  Also, if you no show three or more times for appointments you may be dismissed from the clinic at the providers discretion.     Again, thank you for choosing [facility].  Our hope is that these requests will decrease the amount of time that you wait before being seen by our physicians.       _____________________________________________________________  Should you have questions after your visit to [facility], please contact our office at [phone] and follow the prompts.  Our office hours are 8:00 a.m. and 4:30 p.m. Monday - Friday.  Please note that voicemails left after 4:00 p.m. may not be returned until the following business day.  We are closed weekends and major holidays.  You do have access to a nurse 24-7, just call the main number to the clinic [phone] and do not press any options, hold on the line and a nurse will answer the phone.    For prescription refill requests, have your pharmacy contact our  office and allow 72 hours.    Due to Covid, you will need to wear a mask upon entering the hospital. If you do not have a mask, a mask will be given to you at the Main Entrance upon arrival. For doctor visits, patients may have 1 support person age 69 or older with them. For treatment visits, patients can not have anyone with them due to social distancing guidelines and our immunocompromised population.

## 2023-10-01 ENCOUNTER — Encounter: Payer: Self-pay | Admitting: Oncology

## 2023-10-01 ENCOUNTER — Encounter: Payer: Self-pay | Admitting: *Deleted

## 2023-10-01 LAB — ERYTHROPOIETIN: Erythropoietin: 3.7 m[IU]/mL (ref 2.6–18.5)

## 2023-10-01 NOTE — Addendum Note (Signed)
 Addended by: TANDA LOA MATSU on: 10/01/2023 04:32 PM   Modules accepted: Orders

## 2023-10-05 ENCOUNTER — Inpatient Hospital Stay: Admitting: Oncology

## 2023-10-06 ENCOUNTER — Other Ambulatory Visit: Payer: Self-pay | Admitting: Radiology

## 2023-10-06 DIAGNOSIS — D75839 Thrombocytosis, unspecified: Secondary | ICD-10-CM

## 2023-10-06 NOTE — H&P (Signed)
 Chief Complaint: Chronic thrombocytosis ,JAK2 mutation positive; referred for image guided bone marrow biopsy for further evaluation/rule out myelofibrosis  Referring Provider(s): Davonna DEL  Supervising Physician: Vanice Revel  Patient Status: [facility] - Out-pt  History of Present Illness: Kylie Duffy is a 68 y.o. female with past medical history significant for hyperlipidemia, TIA, cataracts, history of UTI, osteopenia, macular degeneration, sleep disorder, iron deficiency, and chronic thrombocytosis with positive JAK2 mutation.  She is scheduled today for image guided bone marrow biopsy for further evaluation/rule out myelofibrosis.  *** Patient is Full Code  Past Medical History:  Diagnosis Date   Blood transfusion without reported diagnosis 1993   Burning with urination 11/01/2014   Hematuria 07/28/2013   History of UTI    Hyperlipidemia    Nuclear sclerotic cataract of right eye 04/23/2021   Dense cataract, some contribution to the poor vision in the right eye, although ultimately the previous spontaneous macular hole and closure in the right eye might limit the vision to less than 20/20   Osteopenia    PONV (postoperative nausea and vomiting)    Vaginal dryness 12/03/2012    Past Surgical History:  Procedure Laterality Date   ABDOMINAL HYSTERECTOMY  06/2000   BREAST BIOPSY Left    BREAST EXCISIONAL BIOPSY Right    age 70s benign   CLOSED REDUCTION WRIST FRACTURE Right 02/24/2020   Procedure: CLOSED REDUCTION WRIST with pin placement;  Surgeon: Margrette Taft BRAVO, MD;  Location: [facility] ORS;  Service: Orthopedics;  Laterality: Right;   COLONOSCOPY N/A 08/25/2016   Procedure: COLONOSCOPY;  Surgeon: Harvey Margo CROME, MD;  Location: [facility] END[facility] SUITE;  Service: Endoscopy;  Laterality: N/A;   DIAGNOSTIC LAPAROSCOPY     EYE SURGERY  2023   Also 2024, July 2025   FRACTURE SURGERY  02/16/2020   HARDWARE REMOVAL Right 05/29/2020   Procedure: PIN REMOVAL RIGHT WRIST;  Surgeon:  Margrette Taft BRAVO, MD;  Location: [facility] ORS;  Service: Orthopedics;  Laterality: Right;   KNEE ARTHROSCOPY WITH MEDIAL MENISECTOMY  01/26/2012   Procedure: KNEE ARTHROSCOPY WITH MEDIAL MENISECTOMY;  Surgeon: Taft BRAVO Margrette, MD;  Location: [facility] ORS;  Service: Orthopedics;  Laterality: Left;   POLYPECTOMY  08/25/2016   Procedure: POLYPECTOMY;  Surgeon: Harvey Margo CROME, MD;  Location: [facility];  Service: Endoscopy;;   right eye  08/26/2023   vitrectomy with removal of silicone oil   TONSILLECTOMY     VITRECTOMY Right 06/04/2022    Allergies: Patient has no known allergies.  Medications: Prior to Admission medications   Medication Sig Start Date End Date Taking? Authorizing Provider  aspirin  EC 81 MG tablet Take 1 tablet (81 mg total) by mouth daily. Swallow whole. 05/13/23   Raenelle Coria, MD  b complex vitamins capsule Take 1 capsule by mouth daily.    [provider]  CALCIUM  PO Take 1,160 mg by mouth daily.    [provider]  Cholecalciferol (VITAMIN D3 PO) Take 2,000 Units by mouth 2 (two) times daily.    [provider]  COMBIGAN 0.2-0.5 % ophthalmic solution Place 1 drop into the right eye 2 (two) times daily. 07/08/23   [provider]  ibuprofen  (ADVIL ) 600 MG tablet Take 1 tablet (600 mg total) by mouth every 8 (eight) hours as needed. 07/24/22   Mortenson, Ashley, MD  Multiple Vitamin (MULITIVITAMIN WITH MINERALS) TABS Take 1 tablet by mouth daily.    [provider]  Omega-3 Fatty Acids (FISH OIL) 1200 MG CAPS Take 1,200  mg by mouth daily.    [provider]  rosuvastatin  (CRESTOR ) 40 MG tablet Take 1 tablet (40 mg total) by mouth daily. 08/11/23 11/09/23  Aletha Bene, MD  sodium chloride  (MURO 128) 2 % ophthalmic solution Place 1 drop into the right eye every 3 (three) hours as needed for eye irritation.    [provider]  sodium chloride  (MURO 128) 5 % ophthalmic ointment Place 1 Application into the right  eye at bedtime.    [provider]  SUMAtriptan  (IMITREX ) 100 MG tablet Take 1 tablet (100 mg total) by mouth every 2 (two) hours as needed for migraine or headache. May repeat in 2 hours if needed. 03/28/22   Duanne Butler DASEN, MD     Family History  Problem Relation Age of Onset   Hypertension Mother    Hyperlipidemia Mother    Thyroid  disease Mother    Kidney disease Mother    Snoring Mother    Arthritis Mother    Varicose Veins Mother    Snoring Father    Heart disease Father    Hyperlipidemia Father    Arthritis Father    Hypertension Father    Hyperlipidemia Brother        borderline   Diabetes Maternal Grandmother    Varicose Veins Maternal Grandmother    Cancer Paternal Grandmother        pancreatic   Early death Paternal Grandmother    Cancer Maternal Aunt    Cancer Maternal Uncle    Cancer Paternal Aunt    Alzheimer's disease Paternal Aunt    Cancer Paternal Uncle    Cancer Paternal Uncle    Colon cancer Neg Hx    Colon polyps Neg Hx    Breast cancer Neg Hx    Sleep apnea Neg Hx     Social History   Socioeconomic History   Marital status: Married    Spouse name: Not on file   Number of children: Not on file   Years of education: Not on file   Highest education level: Bachelor's degree (e.g., BA, AB, BS)  Occupational History   Not on file  Tobacco Use   Smoking status: Never   Smokeless tobacco: Never  Vaping Use   Vaping status: Never Used  Substance and Sexual Activity   Alcohol use: Not Currently   Drug use: No   Sexual activity: Yes    Birth control/protection: Surgical    Comment: hyst  Other Topics Concern   Not on file  Social History Narrative   Lives at home with husband   Right handed   Caffeine: 2-3 cups/day   Social Drivers of Corporate investment banker Strain: Low Risk  (09/14/2023)   Overall Financial Resource Strain (CARDIA)    Difficulty of Paying Living Expenses: Not hard at all  Food Insecurity: No Food  Insecurity (09/14/2023)   Hunger Vital Sign    Worried About Running Out of Food in the Last Year: Never true    Ran Out of Food in the Last Year: Never true  Transportation Needs: No Transportation Needs (09/14/2023)   PRAPARE - Administrator, Civil Service (Medical): No    Lack of Transportation (Non-Medical): No  Physical Activity: Insufficiently Active (09/14/2023)   Exercise Vital Sign    Days of Exercise per Week: 6 days    Minutes of Exercise per Session: 20 min  Stress: No Stress Concern Present (09/14/2023)   [employer] of Occupational Health -  Occupational Stress Questionnaire    Feeling of Stress: Not at all  Social Connections: Moderately Integrated (09/14/2023)   Social Connection and Isolation Panel    Frequency of Communication with Friends and Family: More than three times a week    Frequency of Social Gatherings with Friends and Family: Once a week    Attends Religious Services: More than 4 times per year    Active Member of [employer] or Organizations: No    Attends Banker Meetings: Never    Marital Status: Married      Review of Systems  Vital Signs:   Advance Care Plan: No documents on file  Physical Exam  Imaging: No results found.  Labs:  CBC: Recent Labs    05/11/23 1146 05/11/23 1202 07/30/23 0912 09/08/23 0920  WBC 9.7  --  10.9* 12.6*  HGB 15.4* 16.3* 14.4 14.1  HCT 48.1* 48.0* 45.0 43.6  PLT 733*  --  699* 684*    COAGS: Recent Labs    05/11/23 1146  INR 1.0  APTT 33    BMP: Recent Labs    05/11/23 1146 05/11/23 1202 07/30/23 0912  NA 140 139 139  K 3.9 3.9 4.8  CL 103 103 101  CO2 25  --  29  GLUCOSE 104* 104* 76  BUN 14 16 18   CALCIUM  9.7  --  9.4  CREATININE 0.73 0.80 0.65  GFRNONAA >60  --   --     LIVER FUNCTION TESTS: Recent Labs    05/11/23 1146 07/30/23 0912  BILITOT 0.6 0.5  AST 21 25  ALT 19 27  ALKPHOS 60  --   PROT 7.6 6.8  ALBUMIN 4.4  --     TUMOR MARKERS: No  results for input(s): AFPTM, CEA, CA199, CHROMGRNA in the last 8760 hours.  Assessment and Plan: 68 y.o. female with past medical history significant for hyperlipidemia, TIA, cataracts, history of UTI, osteopenia, macular degeneration, sleep disorder, iron deficiency, and chronic thrombocytosis with positive JAK2 mutation.  She is scheduled today for image guided bone marrow biopsy for further evaluation/rule out myelofibrosis.Risks and benefits of  was discussed with the patient including, but not limited to bleeding, infection, damage to adjacent structures or low yield requiring additional tests.  All of the questions were answered and there is agreement to proceed.  Consent signed and in chart.   Electronically Signed: D. Franky Rakers, PA-C   10/06/2023, 1:24 PM      I spent a total of  20 minutes   in face to face in clinical consultation, greater than 50% of which was counseling/coordinating care for image guided bone marrow biopsy

## 2023-10-07 ENCOUNTER — Other Ambulatory Visit: Payer: Self-pay

## 2023-10-07 ENCOUNTER — Ambulatory Visit (HOSPITAL_COMMUNITY)
Admission: RE | Admit: 2023-10-07 | Discharge: 2023-10-07 | Disposition: A | Discharge status: Home / Self Care | Source: Ambulatory Visit | Attending: Interventional Radiology | Admitting: Interventional Radiology

## 2023-10-07 ENCOUNTER — Encounter (HOSPITAL_COMMUNITY): Payer: Self-pay

## 2023-10-07 ENCOUNTER — Other Ambulatory Visit (HOSPITAL_COMMUNITY): Payer: Self-pay | Admitting: Interventional Radiology

## 2023-10-07 ENCOUNTER — Ambulatory Visit (HOSPITAL_COMMUNITY)
Admission: RE | Admit: 2023-10-07 | Discharge: 2023-10-07 | Disposition: A | Discharge status: Home / Self Care | Source: Ambulatory Visit | Attending: Oncology

## 2023-10-07 ENCOUNTER — Ambulatory Visit (HOSPITAL_COMMUNITY)
Admission: RE | Admit: 2023-10-07 | Discharge: 2023-10-07 | Disposition: A | Discharge status: Home / Self Care | Source: Ambulatory Visit | Attending: Oncology | Admitting: Oncology

## 2023-10-07 DIAGNOSIS — H269 Unspecified cataract: Secondary | ICD-10-CM | POA: Insufficient documentation

## 2023-10-07 DIAGNOSIS — D7581 Myelofibrosis: Secondary | ICD-10-CM | POA: Diagnosis not present

## 2023-10-07 DIAGNOSIS — Z1379 Encounter for other screening for genetic and chromosomal anomalies: Secondary | ICD-10-CM | POA: Diagnosis not present

## 2023-10-07 DIAGNOSIS — E785 Hyperlipidemia, unspecified: Secondary | ICD-10-CM | POA: Diagnosis not present

## 2023-10-07 DIAGNOSIS — G479 Sleep disorder, unspecified: Secondary | ICD-10-CM | POA: Diagnosis not present

## 2023-10-07 DIAGNOSIS — R58 Hemorrhage, not elsewhere classified: Secondary | ICD-10-CM | POA: Diagnosis not present

## 2023-10-07 DIAGNOSIS — D471 Chronic myeloproliferative disease: Secondary | ICD-10-CM | POA: Diagnosis not present

## 2023-10-07 DIAGNOSIS — H3530 Unspecified macular degeneration: Secondary | ICD-10-CM | POA: Insufficient documentation

## 2023-10-07 DIAGNOSIS — Z87440 Personal history of urinary (tract) infections: Secondary | ICD-10-CM | POA: Insufficient documentation

## 2023-10-07 DIAGNOSIS — Z1589 Genetic susceptibility to other disease: Secondary | ICD-10-CM | POA: Diagnosis not present

## 2023-10-07 DIAGNOSIS — D72829 Elevated white blood cell count, unspecified: Secondary | ICD-10-CM | POA: Insufficient documentation

## 2023-10-07 DIAGNOSIS — D75839 Thrombocytosis, unspecified: Secondary | ICD-10-CM | POA: Insufficient documentation

## 2023-10-07 DIAGNOSIS — Z8673 Personal history of transient ischemic attack (TIA), and cerebral infarction without residual deficits: Secondary | ICD-10-CM | POA: Insufficient documentation

## 2023-10-07 DIAGNOSIS — M8580 Other specified disorders of bone density and structure, unspecified site: Secondary | ICD-10-CM | POA: Insufficient documentation

## 2023-10-07 HISTORY — PX: IR PATIENT EVAL TECH 0-60 MINS: IMG5564

## 2023-10-07 LAB — CBC WITH DIFFERENTIAL/PLATELET
Abs Immature Granulocytes: 0.06 K/uL (ref 0.00–0.07)
Basophils Absolute: 0.1 K/uL (ref 0.0–0.1)
Basophils Relative: 1 %
Eosinophils Absolute: 0.3 K/uL (ref 0.0–0.5)
Eosinophils Relative: 2 %
HCT: 46.2 % — ABNORMAL HIGH (ref 36.0–46.0)
Hemoglobin: 14.2 g/dL (ref 12.0–15.0)
Immature Granulocytes: 1 %
Lymphocytes Relative: 26 %
Lymphs Abs: 2.8 K/uL (ref 0.7–4.0)
MCH: 29.3 pg (ref 26.0–34.0)
MCHC: 30.7 g/dL (ref 30.0–36.0)
MCV: 95.5 fL (ref 80.0–100.0)
Monocytes Absolute: 0.6 K/uL (ref 0.1–1.0)
Monocytes Relative: 6 %
Neutro Abs: 6.8 K/uL (ref 1.7–7.7)
Neutrophils Relative %: 64 %
Platelets: 654 K/uL — ABNORMAL HIGH (ref 150–400)
RBC: 4.84 MIL/uL (ref 3.87–5.11)
RDW: 14.1 % (ref 11.5–15.5)
WBC: 10.7 K/uL — ABNORMAL HIGH (ref 4.0–10.5)
nRBC: 0 % (ref 0.0–0.2)

## 2023-10-07 MED ORDER — SODIUM CHLORIDE 0.9 % IV SOLN: INTRAVENOUS | Status: DC

## 2023-10-07 MED ORDER — FENTANYL CITRATE (PF) 100 MCG/2ML IJ SOLN
INTRAMUSCULAR | Status: AC
  Filled 2023-10-07: qty 2

## 2023-10-07 MED ORDER — NALOXONE HCL 0.4 MG/ML IJ SOLN
INTRAMUSCULAR | Status: AC
  Filled 2023-10-07: qty 1

## 2023-10-07 MED ORDER — LIDOCAINE-EPINEPHRINE 1 %-1:100000 IJ SOLN
INTRAMUSCULAR | Status: AC
  Filled 2023-10-07: qty 1

## 2023-10-07 MED ORDER — FENTANYL CITRATE (PF) 100 MCG/2ML IJ SOLN
INTRAMUSCULAR | Status: AC | PRN
  Administered 2023-10-07: 50 ug via INTRAVENOUS

## 2023-10-07 MED ORDER — MIDAZOLAM HCL 2 MG/2ML IJ SOLN
INTRAMUSCULAR | Status: AC
  Filled 2023-10-07: qty 2

## 2023-10-07 MED ORDER — MIDAZOLAM HCL 2 MG/2ML IJ SOLN
INTRAMUSCULAR | Status: AC | PRN
  Administered 2023-10-07: 1 mg via INTRAVENOUS

## 2023-10-07 MED ORDER — FLUMAZENIL 0.5 MG/5ML IV SOLN
INTRAVENOUS | Status: AC
  Filled 2023-10-07: qty 5

## 2023-10-07 MED ORDER — GELATIN ABSORBABLE 12-7 MM EX MISC
CUTANEOUS | Status: AC
  Filled 2023-10-07: qty 2

## 2023-10-07 MED ORDER — GELATIN ABSORBABLE 12-7 MM EX MISC
1.0000 | Freq: Once | CUTANEOUS | Status: AC
  Administered 2023-10-07: 1 via TOPICAL

## 2023-10-07 NOTE — Progress Notes (Signed)
 1120 Post CT bone biopsy- during 15 min check, bandage with blood- site oozing.   Reinforced site, redressed and pressure held.   Informed K Allred PA and Dr. Vanice. 1130 K. Allred in to place gauze to decrease bleeding.   New dsg applied.   1210- site C/D/I- no further bleeding.

## 2023-10-07 NOTE — Procedures (Signed)
 Interventional Radiology Procedure Note  Procedure: CT BM ASP AND CORE BX    Complications: None  Estimated Blood Loss:  MIN  Findings: 11 G CORE AND ASP    M. Ruel Favors, MD

## 2023-10-07 NOTE — Discharge Instructions (Addendum)
 For questions /concerns may call Interventional Radiology at [phone] or  Interventional Radiology clinic [phone]   You may remove your dressing and shower tomorrow afternoon                                                                                 Bone Marrow Aspiration and Bone Marrow Biopsy, Adult, Care After The following information offers guidance on how to care for yourself after your procedure. Your health care provider may also give you more specific instructions. If you have problems or questions, contact your health care provider. What can I expect after the procedure? After the procedure, it is common to have: Mild pain and tenderness. Swelling. Bruising. Follow these instructions at home: Incision care  Follow instructions from your health care provider about how to take care of the incision site. Make sure you: Wash your hands with soap and water for at least 20 seconds before and after you change your bandage (dressing). If soap and water are not available, use hand sanitizer. Change your dressing as told by your health care provider. Leave stitches (sutures), skin glue, or adhesive strips in place. These skin closures may need to stay in place for 2 weeks or longer. If adhesive strip edges start to loosen and curl up, you may trim the loose edges. Do not remove adhesive strips completely unless your health care provider tells you to do that. Check your incision site every day for signs of infection. Check for: More redness, swelling, or pain. Fluid or blood. Warmth. Pus or a bad smell. Activity Return to your normal activities as told by your health care provider. Ask your health care provider what activities are safe for you. Do not lift anything that is heavier than 10 lb (4.5 kg), or the limit that you are told, until your health care provider says that it is safe. If you were given a sedative during the procedure, it can affect you for several hours. Do  not drive or operate machinery until your health care provider says that it is safe. General instructions  Take over-the-counter and prescription medicines only as told by your health care provider. Do not take baths, swim, or use a hot tub until your health care provider approves. Ask your health care provider if you may take showers. You may only be allowed to take sponge baths. If directed, put ice on the affected area. To do this: Put ice in a plastic bag. Place a towel between your skin and the bag. Leave the ice on for 20 minutes, 2-3 times a day. If your skin turns bright red, remove the ice right away to prevent skin damage. The risk of skin damage is higher if you cannot feel pain, heat, or cold. Contact a health care provider if: You have signs of infection. Your pain is not controlled with medicine. You have cancer, and a temperature of 100.88F (38C) or higher. Get help right away if: You have a temperature of 101F (38.3C) or higher, or as told by your health care provider. You have bleeding from the incision site that cannot be controlled. This information is not intended to replace advice given to you by  your health care provider. Make sure you discuss any questions you have with your health care provider. Document Revised: 05/20/2021 Document Reviewed: 05/20/2021 Elsevier Patient Education  2024 [employer] Inc.                                                                          Moderate Conscious Sedation, Adult, Care After After the procedure, it is common to have: Sleepiness for a few hours. Impaired judgment for a few hours. Trouble with balance. Nausea or vomiting if you eat too soon. Follow these instructions at home: For the time period you were told by your health care provider:  Rest. Do not participate in activities where you could fall or become injured. Do not drive or use machinery. Do not drink alcohol. Do not take sleeping pills or medicines that  cause drowsiness. Do not make important decisions or sign legal documents. Do not take care of children on your own. Eating and drinking Follow instructions from your health care provider about what you may eat and drink. Drink enough fluid to keep your urine pale yellow. If you vomit: Drink clear fluids slowly and in small amounts as you are able. Clear fluids include water, ice chips, low-calorie sports drinks, and fruit juice that has water added to it (diluted fruit juice). Eat light and bland foods in small amounts as you are able. These foods include bananas, applesauce, rice, lean meats, toast, and crackers. General instructions Take over-the-counter and prescription medicines only as told by your health care provider. Have a responsible adult stay with you for the time you are told. Do not use any products that contain nicotine or tobacco. These products include cigarettes, chewing tobacco, and vaping devices, such as e-cigarettes. If you need help quitting, ask your health care provider. Return to your normal activities as told by your health care provider. Ask your health care provider what activities are safe for you. Your health care provider may give you more instructions. Make sure you know what you can and cannot do. Contact a health care provider if: You are still sleepy or having trouble with balance after 24 hours. You feel light-headed. You vomit every time you eat or drink. You get a rash. You have a fever. You have redness or swelling around the IV site. Get help right away if: You have trouble breathing. You start to feel confused at home. These symptoms may be an emergency. Get help right away. Call 911. Do not wait to see if the symptoms will go away. Do not drive yourself to the hospital. This information is not intended to replace advice given to you by your health care provider. Make sure you discuss any questions you have with your health care  provider. Document Revised: 07/29/2021 Document Reviewed: 07/29/2021 Elsevier Patient Education  2024 [employer].

## 2023-10-07 NOTE — Progress Notes (Signed)
 Patient ID: Kylie Duffy, female   DOB: 12-Nov-1955, 68 y.o.   MRN: [id number] Patient returned to IR department today following persistent bleeding from puncture site right lower back following bone marrow biopsy this am.  Hemostasis was not achieved with manual pressure for 20 minutes.  Dr. Vanice placed Gelfoam, cauterized region and placed suture around site with resolution of bleeding.  Patient to return to IR department on Monday (9/15) for suture removal.  Patient was observed in IR holding area for 15 minutes following above measures to ensure no further bleeding.  Patient told to contact IR service with any additional questions or concerns.

## 2023-10-08 ENCOUNTER — Other Ambulatory Visit (HOSPITAL_COMMUNITY): Payer: Self-pay | Admitting: Interventional Radiology

## 2023-10-08 ENCOUNTER — Encounter (HOSPITAL_COMMUNITY): Payer: Self-pay | Admitting: Radiology

## 2023-10-08 DIAGNOSIS — R58 Hemorrhage, not elsewhere classified: Secondary | ICD-10-CM

## 2023-10-08 NOTE — Procedures (Signed)
  Allred, Darrell K, PA-C  Physician Assistant Radiology   Progress Notes    Signed   Date of Service: 10/07/2023  9:00 AM  Related encounter: CT Bone Marrow Biopsy from 10/07/2023 in [facility]   Signed      Patient ID: Kylie Duffy, female   DOB: [birth year], 68 y.o.   MRN: [id number] Patient returned to IR department today following persistent bleeding from puncture site right lower back following bone marrow biopsy this am.  Hemostasis was not achieved with manual pressure for 20 minutes.  Dr. Vanice placed Gelfoam, cauterized region and placed suture around site with resolution of bleeding.  Patient to return to IR department on Monday (9/15) for suture removal.  Patient was observed in IR holding area for 15 minutes following above measures to ensure no further bleeding.  Patient told to contact IR service with any additional questions or concerns.        Cosigned by: Vanice Sharper, MD at 10/07/2023  5:34 PM

## 2023-10-12 ENCOUNTER — Ambulatory Visit (HOSPITAL_COMMUNITY)
Admission: RE | Admit: 2023-10-12 | Discharge: 2023-10-12 | Disposition: A | Discharge status: Home / Self Care | Source: Ambulatory Visit | Attending: Interventional Radiology | Admitting: Interventional Radiology

## 2023-10-12 DIAGNOSIS — R58 Hemorrhage, not elsewhere classified: Secondary | ICD-10-CM

## 2023-10-12 HISTORY — PX: IR RADIOLOGIST EVAL & MGMT: IMG5224

## 2023-10-12 LAB — SURGICAL PATHOLOGY

## 2023-10-12 NOTE — Progress Notes (Signed)
 Patient ID: Kylie Duffy, female   DOB: [birth year], 68 y.o.   MRN: [id number] Patient returned today to IR dept  for suture removal right lower back following recent bone marrow biopsy and subsequent bleeding in region.  On exam today site is clean and dry with only minimal erythema, no evidence of bleeding or hematoma noted.  Chlorhexidine  was applied to puncture site and suture was removed in its entirety without immediate complications.  Gauze dressing applied over site.  Site care instructions were reviewed with patient.  She will contact our team if she has any further questions.

## 2023-10-14 NOTE — Assessment & Plan Note (Signed)
 Presented with thrombocytosis JAK2 mutation: positive BMBx consistent with ET MPN-SAF TSS- 2 points out of 100- 1 point for fatigue and weight loss  -Discussed the diagnosis and prognosis in detail with the patient.  Explained that essential thrombocythemia is associated with overproduction of platelets but has normal life expectancy (20+ years).  The potential for progression to polycythemia vera is 5%, myelofibrosis is 10 to 15% or AML is 5%. -Patient is high risk with age >60 years and JAK2 mutation. She will meet criteria for cytoreduction -Will start hydroxyurea  500 mg twice daily for cytoreduction.Discussed risk versus benefits in detail and most common side effects including bone marrow suppression, nausea, vomiting and skin ulcers.  Patient is in agreement with the plan.  -Continue aspirin  daily -Will obtain US  abdomen to assess for splenomegaly -Will follow up on BCR/ABL  Return to clinic in 1 month for assessment of tolerance of hydrea .

## 2023-10-15 ENCOUNTER — Encounter (HOSPITAL_COMMUNITY): Payer: Self-pay | Admitting: Oncology

## 2023-10-15 ENCOUNTER — Inpatient Hospital Stay (HOSPITAL_BASED_OUTPATIENT_CLINIC_OR_DEPARTMENT_OTHER): Admitting: Oncology

## 2023-10-15 DIAGNOSIS — D473 Essential (hemorrhagic) thrombocythemia: Secondary | ICD-10-CM | POA: Diagnosis not present

## 2023-10-15 DIAGNOSIS — Z7982 Long term (current) use of aspirin: Secondary | ICD-10-CM | POA: Diagnosis not present

## 2023-10-15 DIAGNOSIS — D72829 Elevated white blood cell count, unspecified: Secondary | ICD-10-CM

## 2023-10-15 DIAGNOSIS — D75839 Thrombocytosis, unspecified: Secondary | ICD-10-CM | POA: Diagnosis not present

## 2023-10-15 DIAGNOSIS — D509 Iron deficiency anemia, unspecified: Secondary | ICD-10-CM | POA: Diagnosis not present

## 2023-10-15 DIAGNOSIS — R5383 Other fatigue: Secondary | ICD-10-CM | POA: Diagnosis not present

## 2023-10-15 DIAGNOSIS — E611 Iron deficiency: Secondary | ICD-10-CM | POA: Diagnosis not present

## 2023-10-15 DIAGNOSIS — R634 Abnormal weight loss: Secondary | ICD-10-CM | POA: Diagnosis not present

## 2023-10-15 MED ORDER — HYDROXYUREA 500 MG PO CAPS: 500.0000 mg | ORAL_CAPSULE | Freq: Two times a day (BID) | ORAL | 2 refills | Status: DC

## 2023-10-15 NOTE — Assessment & Plan Note (Addendum)
 Very mild iron deficiency with a TSAT of 16  -Continue oral iron every other day.  Use MiraLAX for constipation

## 2023-10-15 NOTE — Progress Notes (Addendum)
 [facility]  Hematology Follow-up Virtual Visit via Telephone Note  Duanne Butler DASEN, MD  I connected with Niels Dibbles on 10/15/23 at  8:40 AM EDT by telephone and verified that I am speaking with the correct person using two identifiers.  Location: Patient: Patients residence Provider: [facility]  REASON FOR FOLLOW-UP: Essential thrombocythemia  ASSESSMENT & PLAN:  Patient is a 68 y.o. female following for essential thrombocythemia  Assessment & Plan Essential thrombocythemia (HCC) Presented with thrombocytosis JAK2 mutation: positive BMBx consistent with ET MPN-SAF TSS- 2 points out of 100- 1 point for fatigue and weight loss  -Discussed the diagnosis and prognosis in detail with the patient.  Explained that essential thrombocythemia is associated with overproduction of platelets but has normal life expectancy (20+ years).  The potential for progression to polycythemia vera is 5%, myelofibrosis is 10 to 15% or AML is 5%. -Patient is high risk with age >60 years and JAK2 mutation. She will meet criteria for cytoreduction -Will start hydroxyurea  500 mg twice daily for cytoreduction.Discussed risk versus benefits in detail and most common side effects including bone marrow suppression, nausea, vomiting and skin ulcers.  Patient is in agreement with the plan.  -Continue aspirin  daily -Will obtain US  abdomen to assess for splenomegaly -Will follow up on BCR/ABL  Return to clinic in 1 month for assessment of tolerance of hydrea .  Iron deficiency anemia, unspecified iron deficiency anemia type Very mild iron deficiency with a TSAT of 16  -Continue oral iron every other day.  Use MiraLAX for constipation    Orders Placed This Encounter  Procedures   US  Abdomen Complete    Standing Status:   Future    Expected Date:   10/15/2023    Expiration Date:   10/14/2024    Reason for exam::   Rule out splenomegaly    Preferred imaging  location?:   [facility]   CBC with Differential/Platelet    Standing Status:   Future    Expected Date:   11/13/2023    Expiration Date:   02/11/2024   Comprehensive metabolic panel with GFR    Standing Status:   Future    Expected Date:   11/13/2023    Expiration Date:   02/11/2024   Lactate dehydrogenase    Standing Status:   Future    Expected Date:   11/13/2023    Expiration Date:   10/14/2024    I discussed the assessment and treatment plan with the patient. The patient was provided an opportunity to ask questions and all were answered. The patient agreed with the plan and demonstrated an understanding of the instructions.   The patient was advised to call back or seek an in-person evaluation if the symptoms worsen or if the condition fails to improve as anticipated.  I provided 20 minutes of non-face-to-face time during this encounter.   Mickiel Dry, MD 9/18/202512:47 PM    SUMMARY OF HEMATOLOGIC HISTORY: Patient has chronic thrombocytosis since at least 2015.   -JAK2 mutation: positive - Iron panel: TSAT: 16, ferritin: 109 - CRP: 1.2-elevated, sed rate: 4-Normal - LDH: 220, uric acid: 3.5 -10/07/2023: BMBx: Variably cellular marrow (20-50%) involved by JAK2 mutated  myeloproliferative neoplasm   -The overall findings of persistent and progressive peripheral blood  thrombocytosis, along with  proliferation and atypia of megakaryocytes, and positive JAK2-mutation testing are consistent with a  myeloproliferative neoplasm with essential thrombocythemia being  favored.   -Importantly, there is  no overt increase in blasts or fibrosis.   -Normal female karyotype -BCR/ABL: Pending  INTERVAL HISTORY:  I discussed the limitations, risks, security and privacy concerns of performing an evaluation and management service by telephone and the availability of in person appointments. I also discussed with the patient that there may be a patient responsible charge related to  this service. The patient expressed understanding and agreed to proceed.   Niels DELENA Dibbles 68 y.o. female following for newly diagnosed essential thrombocythemia.  We discussed that her biopsy results are consistent with essential thrombocythemia and considering she is greater than 60 years and has JAK2 mutation, she will qualify for treatment for high risk ET.  We discussed the risk versus benefits of starting hydroxyurea .    Patient reported that after the bone marrow biopsy she had an episode of bleeding that required going back to the IR suite and having a stitch placed.  She recently had this done.  She did not have any further bleeding episodes.  She reported that she had some upper respiratory tract infection recently and is still recovering from it.  She does not have any other complaints.  She reported that her fatigue is 1 out of 10 and her loss of weight is 1 out of 10.  She denies night sweats, loss of appetite, fever, chills.  Overall is doing well at baseline  I have reviewed the past medical history, past surgical history, social history and family history with the patient   ALLERGIES:  has no known allergies.  MEDICATIONS:  Current Outpatient Medications  Medication Sig Dispense Refill   aspirin  EC 81 MG tablet Take 1 tablet (81 mg total) by mouth daily. Swallow whole. 30 tablet 12   b complex vitamins capsule Take 1 capsule by mouth daily.     CALCIUM  PO Take 1,160 mg by mouth daily.     Cholecalciferol (VITAMIN D3 PO) Take 2,000 Units by mouth 2 (two) times daily.     COMBIGAN 0.2-0.5 % ophthalmic solution Place 1 drop into the right eye 2 (two) times daily.     hydroxyurea  (HYDREA ) 500 MG capsule Take 1 capsule (500 mg total) by mouth 2 (two) times daily. May take with food to minimize GI side effects. 60 capsule 2   ibuprofen  (ADVIL ) 600 MG tablet Take 1 tablet (600 mg total) by mouth every 8 (eight) hours as needed. 30 tablet 0   Multiple Vitamin (MULITIVITAMIN WITH  MINERALS) TABS Take 1 tablet by mouth daily.     Omega-3 Fatty Acids (FISH OIL) 1200 MG CAPS Take 1,200 mg by mouth daily.     rosuvastatin  (CRESTOR ) 40 MG tablet Take 1 tablet (40 mg total) by mouth daily. 90 tablet 2   sodium chloride  (MURO 128) 2 % ophthalmic solution Place 1 drop into the right eye every 3 (three) hours as needed for eye irritation.     sodium chloride  (MURO 128) 5 % ophthalmic ointment Place 1 Application into the right eye at bedtime.     SUMAtriptan  (IMITREX ) 100 MG tablet Take 1 tablet (100 mg total) by mouth every 2 (two) hours as needed for migraine or headache. May repeat in 2 hours if needed. 10 tablet 1   No current facility-administered medications for this visit.     REVIEW OF SYSTEMS:   Constitutional: Denies fevers, chills or night sweats Eyes: Denies blurriness of vision Ears, nose, mouth, throat, and face: Denies mucositis or sore throat Respiratory: Denies cough, dyspnea or wheezes Cardiovascular: Denies  palpitation, chest discomfort or lower extremity swelling Gastrointestinal:  Denies nausea, heartburn or change in bowel habits Skin: Denies abnormal skin rashes Lymphatics: Denies new lymphadenopathy or easy bruising Neurological:Denies numbness, tingling or new weaknesses Behavioral/Psych: Mood is stable, no new changes  All other systems were reviewed with the patient and are negative.  PHYSICAL EXAMINATION: Deferred for virtual visit  LABORATORY DATA:  I have reviewed the data as listed  Lab Results  Component Value Date   WBC 10.7 (H) 10/07/2023   NEUTROABS 6.8 10/07/2023   HGB 14.2 10/07/2023   HCT 46.2 (H) 10/07/2023   MCV 95.5 10/07/2023   PLT 654 (H) 10/07/2023      Component Value Date/Time   NA 139 07/30/2023 0912   K 4.8 07/30/2023 0912   CL 101 07/30/2023 0912   CO2 29 07/30/2023 0912   GLUCOSE 76 07/30/2023 0912   BUN 18 07/30/2023 0912   CREATININE 0.65 07/30/2023 0912   CALCIUM  9.4 07/30/2023 0912   PROT 6.8  07/30/2023 0912   ALBUMIN 4.4 05/11/2023 1146   AST 25 07/30/2023 0912   ALT 27 07/30/2023 0912   ALKPHOS 60 05/11/2023 1146   BILITOT 0.5 07/30/2023 0912   GFRNONAA >60 05/11/2023 1146   GFRNONAA 77 07/13/2020 0840   GFRAA 89 07/13/2020 0840   PATHOLOGY:  DIAGNOSIS:  BONE MARROW, ASPIRATE, CLOT, CORE:  - Variably cellular marrow (20-50%) involved by JAK2 mutated myeloproliferative neoplasm (See Comment)   PERIPHERAL SMEAR:  - Thrombocytosis  - Leukocytosis    COMMENT:   The overall findings of persistent and progressive peripheral blood thrombocytosis, along with  proliferation and atypia of megakaryocytes, and positive JAK2-mutation testing are consistent with a myeloproliferative neoplasm with essential thrombocythemia being favored. Importantly, there is no overt increase in blasts or fibrosis.  However, correlation with erythropoietin  levels, iron studies,  presence/absence of splenomegaly and serum LDH, cytogenetic studies as  well as interim history, would be helpful in excluding less likely  diagnostic considerations namely pre-fibrotic primary myelofibrosis and  polycythemia vera.    RADIOGRAPHIC STUDIES: I have personally reviewed the radiological images as listed and agreed with the findings in the report.  No results found.

## 2023-10-15 NOTE — Assessment & Plan Note (Signed)
 Patient has a very mild leukocytosis on recent labs Likely secondary to inflammation or reactive No B symptoms  - Will repeat labs today - Will also obtain inflammatory markers-ESR and CRP - If does not resolve, will consider further workup.

## 2023-10-20 ENCOUNTER — Ambulatory Visit: Admitting: Oncology

## 2023-10-20 ENCOUNTER — Encounter: Payer: Self-pay | Admitting: Oncology

## 2023-10-22 DIAGNOSIS — H35352 Cystoid macular degeneration, left eye: Secondary | ICD-10-CM | POA: Diagnosis not present

## 2023-10-22 DIAGNOSIS — H353122 Nonexudative age-related macular degeneration, left eye, intermediate dry stage: Secondary | ICD-10-CM | POA: Diagnosis not present

## 2023-10-22 DIAGNOSIS — H35073 Retinal telangiectasis, bilateral: Secondary | ICD-10-CM | POA: Diagnosis not present

## 2023-10-22 DIAGNOSIS — S0501XD Injury of conjunctiva and corneal abrasion without foreign body, right eye, subsequent encounter: Secondary | ICD-10-CM | POA: Diagnosis not present

## 2023-10-22 DIAGNOSIS — H30121 Disseminated chorioretinal inflammation, peripheral right eye: Secondary | ICD-10-CM | POA: Diagnosis not present

## 2023-10-22 DIAGNOSIS — H18831 Recurrent erosion of cornea, right eye: Secondary | ICD-10-CM | POA: Diagnosis not present

## 2023-10-22 DIAGNOSIS — H4061X2 Glaucoma secondary to drugs, right eye, moderate stage: Secondary | ICD-10-CM | POA: Diagnosis not present

## 2023-10-22 DIAGNOSIS — H35351 Cystoid macular degeneration, right eye: Secondary | ICD-10-CM | POA: Diagnosis not present

## 2023-10-22 DIAGNOSIS — H35342 Macular cyst, hole, or pseudohole, left eye: Secondary | ICD-10-CM | POA: Diagnosis not present

## 2023-10-22 DIAGNOSIS — D473 Essential (hemorrhagic) thrombocythemia: Secondary | ICD-10-CM | POA: Diagnosis not present

## 2023-10-29 ENCOUNTER — Ambulatory Visit: Admitting: Oncology

## 2023-11-04 ENCOUNTER — Ambulatory Visit (HOSPITAL_COMMUNITY)
Admission: RE | Admit: 2023-11-04 | Discharge: 2023-11-04 | Disposition: A | Discharge status: Home / Self Care | Source: Ambulatory Visit | Attending: Oncology | Admitting: Oncology

## 2023-11-04 DIAGNOSIS — D473 Essential (hemorrhagic) thrombocythemia: Secondary | ICD-10-CM | POA: Diagnosis not present

## 2023-11-04 DIAGNOSIS — Z0389 Encounter for observation for other suspected diseases and conditions ruled out: Secondary | ICD-10-CM | POA: Diagnosis not present

## 2023-11-04 DIAGNOSIS — N281 Cyst of kidney, acquired: Secondary | ICD-10-CM | POA: Diagnosis not present

## 2023-11-05 ENCOUNTER — Other Ambulatory Visit: Payer: Self-pay | Admitting: Oncology

## 2023-11-05 MED ORDER — HYDROXYUREA 500 MG PO CAPS: 500.0000 mg | ORAL_CAPSULE | Freq: Two times a day (BID) | ORAL | 2 refills | Status: DC

## 2023-11-10 ENCOUNTER — Encounter: Payer: Self-pay | Admitting: Oncology

## 2023-11-10 DIAGNOSIS — H40051 Ocular hypertension, right eye: Secondary | ICD-10-CM | POA: Diagnosis not present

## 2023-11-10 NOTE — Telephone Encounter (Signed)
Please advise as soon as possible °

## 2023-11-13 ENCOUNTER — Inpatient Hospital Stay: Admitting: Oncology

## 2023-11-13 ENCOUNTER — Other Ambulatory Visit: Payer: Self-pay

## 2023-11-13 ENCOUNTER — Inpatient Hospital Stay: Attending: Oncology

## 2023-11-13 VITALS — BP 113/62 | HR 76 | Temp 97.7°F | Resp 16 | Wt 123.0 lb

## 2023-11-13 DIAGNOSIS — Z7982 Long term (current) use of aspirin: Secondary | ICD-10-CM | POA: Diagnosis not present

## 2023-11-13 DIAGNOSIS — D509 Iron deficiency anemia, unspecified: Secondary | ICD-10-CM

## 2023-11-13 DIAGNOSIS — D473 Essential (hemorrhagic) thrombocythemia: Secondary | ICD-10-CM

## 2023-11-13 DIAGNOSIS — R110 Nausea: Secondary | ICD-10-CM | POA: Insufficient documentation

## 2023-11-13 DIAGNOSIS — D75839 Thrombocytosis, unspecified: Secondary | ICD-10-CM | POA: Diagnosis not present

## 2023-11-13 DIAGNOSIS — R21 Rash and other nonspecific skin eruption: Secondary | ICD-10-CM | POA: Insufficient documentation

## 2023-11-13 DIAGNOSIS — E611 Iron deficiency: Secondary | ICD-10-CM | POA: Insufficient documentation

## 2023-11-13 DIAGNOSIS — D1771 Benign lipomatous neoplasm of kidney: Secondary | ICD-10-CM

## 2023-11-13 DIAGNOSIS — N2889 Other specified disorders of kidney and ureter: Secondary | ICD-10-CM | POA: Diagnosis not present

## 2023-11-13 DIAGNOSIS — D72829 Elevated white blood cell count, unspecified: Secondary | ICD-10-CM

## 2023-11-13 DIAGNOSIS — Z1589 Genetic susceptibility to other disease: Secondary | ICD-10-CM

## 2023-11-13 LAB — COMPREHENSIVE METABOLIC PANEL WITH GFR
ALT: 33 U/L (ref 0–44)
AST: 33 U/L (ref 15–41)
Albumin: 4.6 g/dL (ref 3.5–5.0)
Alkaline Phosphatase: 70 U/L (ref 38–126)
Anion gap: 10 (ref 5–15)
BUN: 16 mg/dL (ref 8–23)
CO2: 28 mmol/L (ref 22–32)
Calcium: 9.3 mg/dL (ref 8.9–10.3)
Chloride: 102 mmol/L (ref 98–111)
Creatinine, Ser: 0.92 mg/dL (ref 0.44–1.00)
GFR, Estimated: >60 mL/min (ref >60)
Glucose, Bld: 92 mg/dL (ref 70–99)
Potassium: 4 mmol/L (ref 3.5–5.1)
Sodium: 140 mmol/L (ref 135–145)
Total Bilirubin: 0.5 mg/dL (ref 0.0–1.2)
Total Protein: 7.1 g/dL (ref 6.5–8.1)

## 2023-11-13 LAB — CBC WITH DIFFERENTIAL/PLATELET
Abs Immature Granulocytes: 0.02 K/uL (ref 0.00–0.07)
Basophils Absolute: 0.1 K/uL (ref 0.0–0.1)
Basophils Relative: 1 %
Eosinophils Absolute: 0.1 K/uL (ref 0.0–0.5)
Eosinophils Relative: 2 %
HCT: 42.1 % (ref 36.0–46.0)
Hemoglobin: 13.7 g/dL (ref 12.0–15.0)
Immature Granulocytes: 0 %
Lymphocytes Relative: 26 %
Lymphs Abs: 1.8 K/uL (ref 0.7–4.0)
MCH: 31.4 pg (ref 26.0–34.0)
MCHC: 32.5 g/dL (ref 30.0–36.0)
MCV: 96.3 fL (ref 80.0–100.0)
Monocytes Absolute: 0.4 K/uL (ref 0.1–1.0)
Monocytes Relative: 6 %
Neutro Abs: 4.7 K/uL (ref 1.7–7.7)
Neutrophils Relative %: 65 %
Platelets: 424 K/uL — ABNORMAL HIGH (ref 150–400)
RBC: 4.37 MIL/uL (ref 3.87–5.11)
RDW: 16.7 % — ABNORMAL HIGH (ref 11.5–15.5)
WBC: 7.2 K/uL (ref 4.0–10.5)
nRBC: 0 % (ref 0.0–0.2)

## 2023-11-13 LAB — LACTATE DEHYDROGENASE: LDH: 233 U/L — ABNORMAL HIGH (ref 98–192)

## 2023-11-13 MED ORDER — HYDROXYUREA 500 MG PO CAPS: 500.0000 mg | ORAL_CAPSULE | Freq: Every day | ORAL | 2 refills | Status: AC

## 2023-11-13 MED ORDER — TRIAMCINOLONE ACETONIDE 0.1 % EX CREA: 1.0000 | TOPICAL_CREAM | Freq: Two times a day (BID) | CUTANEOUS | 0 refills | Status: AC

## 2023-11-13 NOTE — Progress Notes (Signed)
 [facility] at [facility]  HEMATOLOGY FOLLOW-UP VISIT  Kylie Butler DASEN, MD  REASON FOR FOLLOW-UP: Essential thrombocythemia  ASSESSMENT & PLAN:  Patient is a 68 y.o. female following for thrombocythemia  Essential Thrombocythemia (HCC) Presented with thrombocytosis JAK2 mutation: positive BMBx consistent with ET MPN-SAF TSS- 2 points out of 100- 1 point for fatigue and weight loss BCR ABL pending at this time No splenomegaly on ultrasound   -Patient is high risk with age >60 years and JAK2 mutation. She will meet criteria for cytoreduction - Started on hydroxyurea  500 mg twice daily for cytoreduction.  Patient had some rash and nausea.  Will decrease dose of hydroxyurea  500 mg once daily today.   -Continue aspirin  daily -Will follow up on BCR/ABL   Return to clinic in 2 months for assessment of tolerance of hydrea .  Iron Deficiency Anemia, unspecified type irion deficiency anemia type Very mild iron deficiency with a TSAT of 16   -Continue oral iron every other day.  Use MiraLAX for constipation  Rash Likely allergic reaction to hydroxyurea  No respiratory distress  - Continue Benadryl cream and Benadryl oral as needed - Will prescribe triamcinolone  cream for the rash as needed  Bleeding Patient was concerned about acquired von Willebrand disease  - Will check labs with next blood  Right renal mass Concerning for angiomyolipoma.  Repeat suggested in 6 months  -Will repeat after ultrasound in 6 months.  Due 04/2023  Orders Placed This Encounter  Procedures   CBC with Differential/Platelet    Standing Status:   Future    Expected Date:   01/11/2024    Expiration Date:   04/10/2024   Comprehensive metabolic panel with GFR    Standing Status:   Future    Expected Date:   01/11/2024    Expiration Date:   04/10/2024   Ferritin    Standing Status:   Future    Expected Date:   01/11/2024    Expiration Date:   04/10/2024   Folate     Standing Status:   Future    Expected Date:   01/11/2024    Expiration Date:   04/10/2024   Vitamin B12    Standing Status:   Future    Expected Date:   01/11/2024    Expiration Date:   04/10/2024   Iron and TIBC    Standing Status:   Future    Expected Date:   01/11/2024    Expiration Date:   04/10/2024   Von Willebrand panel    Standing Status:   Future    Expected Date:   01/11/2024    Expiration Date:   04/10/2024    The total time spent in the appointment was 30 minutes encounter with patients including review of chart and various tests results, discussions about plan of care and coordination of care plan   All questions were answered. The patient knows to call the clinic with any problems, questions or concerns. No barriers to learning was detected.  Mickiel Dry, MD 10/17/20259:31 AM   SUMMARY OF HEMATOLOGIC HISTORY: Patient has chronic thrombocytosis since at least 2015.   -JAK2 mutation: positive - Iron panel: TSAT: 16, ferritin: 109 - CRP: 1.2-elevated, sed rate: 4-Normal - LDH: 220, uric acid: 3.5 -10/07/2023: BMBx: Variably cellular marrow (20-50%) involved by JAK2 mutated  myeloproliferative neoplasm              -The overall findings of persistent and progressive peripheral blood  thrombocytosis, along with  proliferation and atypia of megakaryocytes, and positive JAK2-mutation testing are consistent with a  myeloproliferative neoplasm with essential thrombocythemia being  favored.              -Importantly, there is no overt increase in blasts or fibrosis.              -Normal female karyotype -BCR/ABL: Pending - Ultrasound abdomen: No splenomegaly   INTERVAL HISTORY: Kylie Duffy 68 y.o. female following for essential thrombocythemia.   Patient reported some rash after taking hydroxyurea .  We recommended using Benadryl cream and Benadryl pills as needed.  She reported some improvement with that.  She does not have any shortness of breath.  She also reported  some nausea with hydroxyurea  but has improved after taking it with food.  Kylie Duffy notes that she is has had major bleeding episodes in her past, as well as heavy periods prior to her hysterectomy and requested for acquired von Willebrand testing. She is still taking iron supplements every other day.  I have reviewed the past medical history, past surgical history, social history and family history with the patient   ALLERGIES:  has no known allergies.  MEDICATIONS:  Current Outpatient Medications  Medication Sig Dispense Refill   aspirin  EC 81 MG tablet Take 1 tablet (81 mg total) by mouth daily. Swallow whole. 30 tablet 12   b complex vitamins capsule Take 1 capsule by mouth daily.     CALCIUM  PO Take 1,160 mg by mouth daily.     Cholecalciferol (VITAMIN D3 PO) Take 2,000 Units by mouth 2 (two) times daily.     COMBIGAN 0.2-0.5 % ophthalmic solution Place 1 drop into the right eye 2 (two) times daily.     ibuprofen  (ADVIL ) 600 MG tablet Take 1 tablet (600 mg total) by mouth every 8 (eight) hours as needed. 30 tablet 0   Multiple Vitamin (MULITIVITAMIN WITH MINERALS) TABS Take 1 tablet by mouth daily.     Omega-3 Fatty Acids (FISH OIL) 1200 MG CAPS Take 1,200 mg by mouth daily.     rosuvastatin  (CRESTOR ) 40 MG tablet Take 1 tablet (40 mg total) by mouth daily. 90 tablet 2   sodium chloride  (MURO 128) 2 % ophthalmic solution Place 1 drop into the right eye every 3 (three) hours as needed for eye irritation.     sodium chloride  (MURO 128) 5 % ophthalmic ointment Place 1 Application into the right eye at bedtime.     SUMAtriptan  (IMITREX ) 100 MG tablet Take 1 tablet (100 mg total) by mouth every 2 (two) hours as needed for migraine or headache. May repeat in 2 hours if needed. 10 tablet 1   triamcinolone  cream (KENALOG) 0.1 % Apply 1 Application topically 2 (two) times daily. 30 g 0   hydroxyurea  (HYDREA ) 500 MG capsule Take 1 capsule (500 mg total) by mouth 2 (two) times daily. May take with food  to minimize GI side effects. (Patient not taking: Reported on 11/13/2023) 60 capsule 2   No current facility-administered medications for this visit.     REVIEW OF SYSTEMS:   Constitutional: Denies fevers, chills or night sweats Eyes: Denies blurriness of vision Ears, nose, mouth, throat, and face: Denies mucositis or sore throat Respiratory: Denies cough, dyspnea or wheezes Cardiovascular: Denies palpitation, chest discomfort or lower extremity swelling Gastrointestinal:  Denies nausea, heartburn or change in bowel habits Skin: Denies abnormal skin rashes Lymphatics: Denies new lymphadenopathy or easy bruising Neurological:Denies numbness, tingling or new weaknesses Behavioral/Psych: Mood is  stable, no new changes  All other systems were reviewed with the patient and are negative.  PHYSICAL EXAMINATION:   Vitals:   11/13/23 0821  BP: 113/62  Pulse: 76  Resp: 16  Temp: 97.7 F (36.5 C)  SpO2: 97%    GENERAL:alert, no distress and comfortable SKIN: Slight erythematous rash on the neck, healing at this time. LYMPH:  no palpable lymphadenopathy in the cervical, axillary or inguinal LUNGS: clear to auscultation and percussion with normal breathing effort HEART: regular rate & rhythm and no murmurs and no lower extremity edema ABDOMEN:abdomen soft, non-tender and normal bowel sounds Musculoskeletal:no cyanosis of digits and no clubbing  NEURO: alert & oriented x 3 with fluent speech  LABORATORY DATA:  I have reviewed the data as listed  Lab Results  Component Value Date   WBC 7.2 11/13/2023   NEUTROABS 4.7 11/13/2023   HGB 13.7 11/13/2023   HCT 42.1 11/13/2023   MCV 96.3 11/13/2023   PLT 424 (H) 11/13/2023        Chemistry      Component Value Date/Time   NA 140 11/13/2023 0805   K 4.0 11/13/2023 0805   CL 102 11/13/2023 0805   CO2 28 11/13/2023 0805   BUN 16 11/13/2023 0805   CREATININE 0.92 11/13/2023 0805   CREATININE 0.65 07/30/2023 0912      Component  Value Date/Time   CALCIUM  9.3 11/13/2023 0805   ALKPHOS 70 11/13/2023 0805   AST 33 11/13/2023 0805   ALT 33 11/13/2023 0805   BILITOT 0.5 11/13/2023 0805         RADIOGRAPHIC STUDIES: I have personally reviewed the radiological images as listed and agreed with the findings in the report.  US  Abdomen Complete CLINICAL DATA:  Assess for splenomegaly.  EXAM: ABDOMEN ULTRASOUND COMPLETE  COMPARISON:  None Available.  FINDINGS: Gallbladder: No gallstones or wall thickening visualized. No sonographic Murphy sign noted by sonographer.  Common bile duct: Diameter: 1.5 mm  Liver: No focal lesion identified. Within normal limits in parenchymal echogenicity. Portal vein is patent on color Doppler imaging with normal direction of blood flow towards the liver.  IVC: No abnormality visualized.  Pancreas: Visualized portion unremarkable.  Spleen: Size and appearance within normal limits.  Right Kidney: Length: 10.6. Echogenicity within normal limits. Hyperechoic 6 mm interpolar renal mass.  Left Kidney: Length: 10.2. Echogenicity within normal limits. 4.7 cm anechoic renal cyst.  Abdominal aorta: No aneurysm visualized.  Other findings: None.  IMPRESSION: 1. No evidence of splenomegaly. 2. Hyperechoic 6 mm interpolar right renal mass, likely an angiomyolipoma. Consider follow-up ultrasound in 6 months to assess stability. 3. 4.7 cm left renal cyst.  Electronically Signed   By: Reyes Holder M.D.   On: 11/07/2023 14:44

## 2023-11-19 DIAGNOSIS — H35351 Cystoid macular degeneration, right eye: Secondary | ICD-10-CM | POA: Diagnosis not present

## 2023-11-19 DIAGNOSIS — H30121 Disseminated chorioretinal inflammation, peripheral right eye: Secondary | ICD-10-CM | POA: Diagnosis not present

## 2023-11-21 ENCOUNTER — Encounter: Payer: Self-pay | Admitting: Oncology

## 2023-11-23 NOTE — Telephone Encounter (Signed)
**Note De-identified  [place] Obfuscation** Please advise 

## 2023-11-30 DIAGNOSIS — Z1283 Encounter for screening for malignant neoplasm of skin: Secondary | ICD-10-CM | POA: Diagnosis not present

## 2023-11-30 DIAGNOSIS — B078 Other viral warts: Secondary | ICD-10-CM | POA: Diagnosis not present

## 2023-11-30 DIAGNOSIS — D225 Melanocytic nevi of trunk: Secondary | ICD-10-CM | POA: Diagnosis not present

## 2023-12-03 DIAGNOSIS — S0501XD Injury of conjunctiva and corneal abrasion without foreign body, right eye, subsequent encounter: Secondary | ICD-10-CM | POA: Diagnosis not present

## 2023-12-03 DIAGNOSIS — H35073 Retinal telangiectasis, bilateral: Secondary | ICD-10-CM | POA: Diagnosis not present

## 2023-12-03 DIAGNOSIS — H35341 Macular cyst, hole, or pseudohole, right eye: Secondary | ICD-10-CM | POA: Diagnosis not present

## 2023-12-03 DIAGNOSIS — H34231 Retinal artery branch occlusion, right eye: Secondary | ICD-10-CM | POA: Diagnosis not present

## 2023-12-03 DIAGNOSIS — H4061X2 Glaucoma secondary to drugs, right eye, moderate stage: Secondary | ICD-10-CM | POA: Diagnosis not present

## 2023-12-03 DIAGNOSIS — H30121 Disseminated chorioretinal inflammation, peripheral right eye: Secondary | ICD-10-CM | POA: Diagnosis not present

## 2023-12-03 DIAGNOSIS — D473 Essential (hemorrhagic) thrombocythemia: Secondary | ICD-10-CM | POA: Diagnosis not present

## 2023-12-03 DIAGNOSIS — H18831 Recurrent erosion of cornea, right eye: Secondary | ICD-10-CM | POA: Diagnosis not present

## 2023-12-03 DIAGNOSIS — H35351 Cystoid macular degeneration, right eye: Secondary | ICD-10-CM | POA: Diagnosis not present

## 2023-12-09 ENCOUNTER — Ambulatory Visit

## 2023-12-09 VITALS — Ht 62.5 in | Wt 123.0 lb

## 2023-12-09 DIAGNOSIS — Z0000 Encounter for general adult medical examination without abnormal findings: Secondary | ICD-10-CM | POA: Diagnosis not present

## 2023-12-09 NOTE — Patient Instructions (Signed)
 Kylie Duffy,  Thank you for taking the time for your Medicare Wellness Visit. I appreciate your continued commitment to your health goals. Please review the care plan we discussed, and feel free to reach out if I can assist you further.  Please note that Annual Wellness Visits do not include a physical exam. Some assessments may be limited, especially if the visit was conducted virtually. If needed, we may recommend an in-person follow-up with your provider.  Ongoing Care Seeing your primary care provider every 3 to 6 months helps us  monitor your health and provide consistent, personalized care.  Referrals If a referral was made during today's visit and you haven't received any updates within two weeks, please contact the referred provider directly to check on the status.  Recommended Screenings:  Health Maintenance  Topic Date Due   Zoster (Shingles) Vaccine (1 of 2) Never done   COVID-19 Vaccine (3 - Moderna risk series) 12/01/2019   Pneumococcal Vaccine for age over 62 (1 of 1 - PCV) 08/09/2024*   Medicare Annual Wellness Visit  12/08/2024   Breast Cancer Screening  07/14/2025   Colon Cancer Screening  08/26/2027   DTaP/Tdap/Td vaccine (3 - Td or Tdap) 07/12/2028   DEXA scan (bone density measurement)  Completed   Hepatitis C Screening  Completed   Meningitis B Vaccine  Aged Out  *Topic was postponed. The date shown is not the original due date.       12/09/2023   10:49 AM  Advanced Directives  Does Patient Have a Medical Advance Directive? No  Would patient like information on creating a medical advance directive? Yes (MAU/Ambulatory/Procedural Areas - Information given)   Information on Advanced Care Planning can be found at [place]  Secretary of [facility] Advance Health Care Directives Advance Health Care Directives ([email or web address]    Vision: Annual vision screenings are recommended for early detection of glaucoma, cataracts, and diabetic retinopathy. These exams can also  reveal signs of chronic conditions such as diabetes and high blood pressure.  Dental: Annual dental screenings help detect early signs of oral cancer, gum disease, and other conditions linked to overall health, including heart disease and diabetes.  Please see the attached documents for additional preventive care recommendations.

## 2023-12-09 NOTE — Progress Notes (Signed)
 Chief Complaint  Patient presents with   Medicare Wellness     Subjective:   Kylie Duffy is a 68 y.o. female who presents for a Medicare Annual Wellness Visit.  Allergies (verified) Patient has no known allergies.   History: Past Medical History:  Diagnosis Date   Blood transfusion without reported diagnosis 1993   Burning with urination 11/01/2014   Hematuria 07/28/2013   History of UTI    Hyperlipidemia    Nuclear sclerotic cataract of right eye 04/23/2021   Dense cataract, some contribution to the poor vision in the right eye, although ultimately the previous spontaneous macular hole and closure in the right eye might limit the vision to less than 20/20   Osteopenia    PONV (postoperative nausea and vomiting)    Vaginal dryness 12/03/2012   Past Surgical History:  Procedure Laterality Date   ABDOMINAL HYSTERECTOMY  06/2000   BREAST BIOPSY Left    BREAST EXCISIONAL BIOPSY Right    age 37s benign   CLOSED REDUCTION WRIST FRACTURE Right 02/24/2020   Procedure: CLOSED REDUCTION WRIST with pin placement;  Surgeon: Margrette Taft BRAVO, MD;  Location: [facility];  Service: Orthopedics;  Laterality: Right;   COLONOSCOPY N/A 08/25/2016   Procedure: COLONOSCOPY;  Surgeon: Harvey Margo CROME, MD;  Location: [facility];  Service: Endoscopy;  Laterality: N/A;   DIAGNOSTIC LAPAROSCOPY     EYE SURGERY  2023   Also 2024, July 2025   FRACTURE SURGERY  02/16/2020   HARDWARE REMOVAL Right 05/29/2020   Procedure: PIN REMOVAL RIGHT WRIST;  Surgeon: Margrette Taft BRAVO, MD;  Location: [facility];  Service: Orthopedics;  Laterality: Right;   IR PATIENT EVAL TECH 0-60 MINS  10/07/2023   IR RADIOLOGIST EVAL & MGMT  10/12/2023   KNEE ARTHROSCOPY WITH MEDIAL MENISECTOMY  01/26/2012   Procedure: KNEE ARTHROSCOPY WITH MEDIAL MENISECTOMY;  Surgeon: Taft BRAVO Margrette, MD;  Location: [facility];  Service: Orthopedics;  Laterality: Left;   POLYPECTOMY  08/25/2016   Procedure: POLYPECTOMY;  Surgeon: Harvey Margo CROME, MD;  Location: [facility];  Service: Endoscopy;;   right eye  08/26/2023   vitrectomy with removal of silicone oil   TONSILLECTOMY     VITRECTOMY Right 06/04/2022   Family History  Problem Relation Age of Onset   Hypertension Mother    Hyperlipidemia Mother    Thyroid  disease Mother    Kidney disease Mother    Snoring Mother    Arthritis Mother    Varicose Veins Mother    Snoring Father    Heart disease Father    Hyperlipidemia Father    Arthritis Father    Hypertension Father    Hyperlipidemia Brother        borderline   Diabetes Maternal Grandmother    Varicose Veins Maternal Grandmother    Cancer Paternal Grandmother        pancreatic   Early death Paternal Grandmother    Cancer Maternal Aunt    Cancer Maternal Uncle    Cancer Paternal Aunt    Alzheimer's disease Paternal Aunt    Cancer Paternal Uncle    Cancer Paternal Uncle    Colon cancer Neg Hx    Colon polyps Neg Hx    Breast cancer Neg Hx    Sleep apnea Neg Hx    Social History   Occupational History   Not on file  Tobacco Use   Smoking status: Never   Smokeless tobacco: Never  Vaping Use  Vaping status: Never Used  Substance and Sexual Activity   Alcohol use: Not Currently   Drug use: No   Sexual activity: Yes    Birth control/protection: Surgical    Comment: hyst   Tobacco Counseling Counseling given: Not Answered  SDOH Screenings   Food Insecurity: No Food Insecurity (12/09/2023)  Housing: Low Risk  (12/09/2023)  Transportation Needs: No Transportation Needs (12/09/2023)  Utilities: Not At Risk (12/09/2023)  Alcohol Screen: Low Risk  (09/14/2023)  Depression (PHQ2-9): Low Risk  (12/09/2023)  Financial Resource Strain: Low Risk  (09/14/2023)  Physical Activity: Sufficiently Active (12/09/2023)  Recent Concern: Physical Activity - Insufficiently Active (09/14/2023)  Social Connections: Moderately Integrated (12/09/2023)  Stress: No Stress Concern Present (12/09/2023)  Tobacco  Use: Low Risk  (12/09/2023)  Health Literacy: Adequate Health Literacy (12/09/2023)   Depression Screen    12/09/2023   10:51 AM 11/13/2023    8:21 AM 10/15/2023    8:26 AM 09/30/2023    2:25 PM 09/14/2023   11:40 AM 09/08/2023    8:49 AM 08/07/2023   11:15 AM  PHQ 2/9 Scores  PHQ - 2 Score 0 0 0 0 0 0 0  PHQ- 9 Score  0  0  0  0        Data saved with a previous flowsheet row definition     Goals Addressed             This Visit's Progress    Remain active and independent   On track      Visit info / Clinical Intake: Medicare Wellness Visit Type:: Subsequent Annual Wellness Visit Persons participating in visit:: patient Medicare Wellness Visit Mode:: Telephone If telephone:: video declined Because this visit was a virtual/telehealth visit:: vitals recorded from last visit If Telephone or Video please confirm:: The patient expressed understanding and agreed to proceed; I discussed the limitations of evaluation and management by telemedicine; I connected with the patient using audio enabled telemedicine application and verified that I am speaking with the correct person using two identifiers Patient Location:: home Provider Location:: office Information given by:: patient Interpreter Needed?: No Pre-visit prep was completed: yes AWV questionnaire completed by patient prior to visit?: no Living arrangements:: lives with spouse/significant other Patient's Overall Health Status Rating: good Typical amount of pain: some Does pain affect daily life?: no Are you currently prescribed opioids?: no  Dietary Habits and Nutritional Risks How many meals a day?: 3 Eats fruit and vegetables daily?: yes Most meals are obtained by: preparing own meals Diabetic:: no  Functional Status Activities of Daily Living (to include ambulation/medication): Independent Ambulation: Independent Medication Administration: Independent Home Management: Independent Manage your own finances?:  yes Primary transportation is: driving Concerns about vision?: no *vision screening is required for WTM* Concerns about hearing?: no  Fall Screening Falls in the past year?: 0 Number of falls in past year: 0 Was there an injury with Fall?: 0 Fall Risk Category Calculator: 0 Patient Fall Risk Level: Low Fall Risk  Fall Risk Patient at Risk for Falls Due to: No Fall Risks Fall risk Follow up: Falls prevention discussed; Falls evaluation completed; Education provided  Home and Transportation Safety: All rugs have non-skid backing?: yes All stairs or steps have railings?: yes Grab bars in the bathtub or shower?: yes Have non-skid surface in bathtub or shower?: yes Good home lighting?: yes Regular seat belt use?: yes Hospital stays in the last year:: no  Cognitive Assessment Difficulty concentrating, remembering, or making decisions? : no Will  6CIT or Mini Cog be Completed: no 6CIT or Mini Cog Declined: patient alert, oriented, able to answer questions appropriately and recall recent events  Advance Directives (For Healthcare) Does Patient Have a Medical Advance Directive?: No Would patient like information on creating a medical advance directive?: Yes (MAU/Ambulatory/Procedural Areas - Information given)  Reviewed/Updated  Reviewed/Updated: Reviewed All (Medical, Surgical, Family, Medications, Allergies, Care Teams, Patient Goals)        Objective:    Today's Vitals   12/09/23 0846  Weight: 123 lb (55.8 kg)  Height: 5' 2.5 (1.588 m)   Body mass index is 22.14 kg/m.  Current Medications (verified) Outpatient Encounter Medications as of 12/09/2023  Medication Sig   aspirin  EC 81 MG tablet Take 1 tablet (81 mg total) by mouth daily. Swallow whole.   b complex vitamins capsule Take 1 capsule by mouth daily.   CALCIUM  PO Take 1,160 mg by mouth daily.   Cholecalciferol (VITAMIN D3 PO) Take 2,000 Units by mouth 2 (two) times daily.   COMBIGAN 0.2-0.5 % ophthalmic  solution Place 1 drop into the right eye 2 (two) times daily.   hydroxyurea  (HYDREA ) 500 MG capsule Take 1 capsule (500 mg total) by mouth daily. May take with food to minimize GI side effects.   ibuprofen  (ADVIL ) 600 MG tablet Take 1 tablet (600 mg total) by mouth every 8 (eight) hours as needed.   Multiple Vitamin (MULITIVITAMIN WITH MINERALS) TABS Take 1 tablet by mouth daily.   Omega-3 Fatty Acids (FISH OIL) 1200 MG CAPS Take 1,200 mg by mouth daily.   rosuvastatin  (CRESTOR ) 40 MG tablet Take 1 tablet (40 mg total) by mouth daily.   sodium chloride  (MURO 128) 2 % ophthalmic solution Place 1 drop into the right eye every 3 (three) hours as needed for eye irritation.   sodium chloride  (MURO 128) 5 % ophthalmic ointment Place 1 Application into the right eye at bedtime.   SUMAtriptan  (IMITREX ) 100 MG tablet Take 1 tablet (100 mg total) by mouth every 2 (two) hours as needed for migraine or headache. May repeat in 2 hours if needed.   triamcinolone  cream (KENALOG) 0.1 % Apply 1 Application topically 2 (two) times daily.   No facility-administered encounter medications on file as of 12/09/2023.   Hearing/Vision screen Hearing Screening - Comments:: Patient is able to hear conversational tones without difficulty. No issues reported.   Vision Screening - Comments:: Wears rx glasses - up to date with routine eye exams with Dr. Octavia  Immunizations and Health Maintenance Health Maintenance  Topic Date Due   Zoster Vaccines- Shingrix (1 of 2) Never done   COVID-19 Vaccine (3 - Moderna risk series) 12/01/2019   Pneumococcal Vaccine: 50+ Years (1 of 1 - PCV) 08/09/2024 (Originally 05/25/2006)   Medicare Annual Wellness (AWV)  12/08/2024   Mammogram  07/14/2025   Colonoscopy  08/26/2027   DTaP/Tdap/Td (3 - Td or Tdap) 07/12/2028   DEXA SCAN  Completed   Hepatitis C Screening  Completed   Meningococcal B Vaccine  Aged Out        Assessment/Plan:  This is a routine wellness examination for  Kylie Duffy.  Patient Care Team: Duanne Butler DASEN, MD as PCP - General (Family Medicine) [facility] Rankin, Arley LABOR, MD as Consulting Physician (Ophthalmology) Davonna Siad, MD as Consulting Physician (Oncology) Signa Delon LABOR, NP as Nurse Practitioner (Obstetrics and Gynecology) Buck Saucer, MD as Attending Physician (Neurology)  I have personally reviewed and noted the following in the patient's  chart:   Medical and social history Use of alcohol, tobacco or illicit drugs  Current medications and supplements including opioid prescriptions. Functional ability and status Nutritional status Physical activity Advanced directives List of other physicians Hospitalizations, surgeries, and ER visits in previous 12 months Vitals Screenings to include cognitive, depression, and falls Referrals and appointments  No orders of the defined types were placed in this encounter.  In addition, I have reviewed and discussed with patient certain preventive protocols, quality metrics, and best practice recommendations. A written personalized care plan for preventive services as well as general preventive health recommendations were provided to patient.   Kylie Charmaine Browner, LPN   [id number]/[id number]   Return in 1 year (on 12/08/2024).  After Visit Summary: (MyChart) Due to this being a telephonic visit, the after visit summary with patients personalized plan was offered to patient via MyChart   Nurse Notes: No concerns at this time

## 2023-12-21 DIAGNOSIS — D473 Essential (hemorrhagic) thrombocythemia: Secondary | ICD-10-CM | POA: Diagnosis not present

## 2023-12-21 DIAGNOSIS — H35073 Retinal telangiectasis, bilateral: Secondary | ICD-10-CM | POA: Diagnosis not present

## 2023-12-21 DIAGNOSIS — H18831 Recurrent erosion of cornea, right eye: Secondary | ICD-10-CM | POA: Diagnosis not present

## 2023-12-21 DIAGNOSIS — H353122 Nonexudative age-related macular degeneration, left eye, intermediate dry stage: Secondary | ICD-10-CM | POA: Diagnosis not present

## 2023-12-21 DIAGNOSIS — H353113 Nonexudative age-related macular degeneration, right eye, advanced atrophic without subfoveal involvement: Secondary | ICD-10-CM | POA: Diagnosis not present

## 2023-12-21 DIAGNOSIS — H34231 Retinal artery branch occlusion, right eye: Secondary | ICD-10-CM | POA: Diagnosis not present

## 2023-12-21 DIAGNOSIS — H35341 Macular cyst, hole, or pseudohole, right eye: Secondary | ICD-10-CM | POA: Diagnosis not present

## 2023-12-21 DIAGNOSIS — S0501XD Injury of conjunctiva and corneal abrasion without foreign body, right eye, subsequent encounter: Secondary | ICD-10-CM | POA: Diagnosis not present

## 2023-12-21 DIAGNOSIS — H4061X2 Glaucoma secondary to drugs, right eye, moderate stage: Secondary | ICD-10-CM | POA: Diagnosis not present

## 2024-01-06 DIAGNOSIS — G510 Bell's palsy: Secondary | ICD-10-CM | POA: Insufficient documentation

## 2024-01-15 ENCOUNTER — Inpatient Hospital Stay: Attending: Oncology | Admitting: Oncology

## 2024-01-15 ENCOUNTER — Inpatient Hospital Stay

## 2024-01-19 ENCOUNTER — Encounter: Payer: Self-pay | Admitting: Family Medicine

## 2024-01-19 ENCOUNTER — Ambulatory Visit (INDEPENDENT_AMBULATORY_CARE_PROVIDER_SITE_OTHER): Admitting: Family Medicine

## 2024-01-19 VITALS — BP 118/82 | HR 80 | Temp 99.3°F | Ht 62.5 in | Wt 120.4 lb

## 2024-01-19 DIAGNOSIS — J069 Acute upper respiratory infection, unspecified: Secondary | ICD-10-CM

## 2024-01-19 DIAGNOSIS — E785 Hyperlipidemia, unspecified: Secondary | ICD-10-CM | POA: Diagnosis not present

## 2024-01-19 MED ORDER — ASPIRIN 81 MG PO TBEC: 81.0000 mg | DELAYED_RELEASE_TABLET | Freq: Every day | ORAL | 12 refills | Status: AC

## 2024-01-19 MED ORDER — ROSUVASTATIN CALCIUM 40 MG PO TABS: 40.0000 mg | ORAL_TABLET | Freq: Every day | ORAL | 2 refills | Status: AC

## 2024-01-19 NOTE — Progress Notes (Signed)
 "  Subjective:    Patient ID: Kylie Duffy, female    DOB: Jun 01, 1955, 68 y.o.   MRN: [id number] Symptoms began last Wednesday.  Symptoms include head congestion, rhinorrhea, sore throat although her sore throat is very mild, and persistent cough.  Her biggest concern is the persistent cough and whether she is contagious.  Her mother lives in a nursing home.  She denies any fevers after the first 2 days.  She denies any chest pain or shortness of breath.  She denies any otalgia or pleurisy.  She denies any hemoptysis or purulent sputum. Past Medical History:  Diagnosis Date   Blood transfusion without reported diagnosis 1993   Burning with urination 11/01/2014   Hematuria 07/28/2013   History of UTI    Hyperlipidemia    Nuclear sclerotic cataract of right eye 04/23/2021   Dense cataract, some contribution to the poor vision in the right eye, although ultimately the previous spontaneous macular hole and closure in the right eye might limit the vision to less than 20/20   Osteopenia    PONV (postoperative nausea and vomiting)    Vaginal dryness 12/03/2012   Past Surgical History:  Procedure Laterality Date   ABDOMINAL HYSTERECTOMY  06/2000   BREAST BIOPSY Left    BREAST EXCISIONAL BIOPSY Right    age 65s benign   CLOSED REDUCTION WRIST FRACTURE Right 02/24/2020   Procedure: CLOSED REDUCTION WRIST with pin placement;  Surgeon: Margrette Taft BRAVO, MD;  Location: [facility];  Service: Orthopedics;  Laterality: Right;   COLONOSCOPY N/A 08/25/2016   Procedure: COLONOSCOPY;  Surgeon: Harvey Margo CROME, MD;  Location: [facility];  Service: Endoscopy;  Laterality: N/A;   DIAGNOSTIC LAPAROSCOPY     EYE SURGERY  2023   Also 2024, July 2025   FRACTURE SURGERY  02/16/2020   HARDWARE REMOVAL Right 05/29/2020   Procedure: PIN REMOVAL RIGHT WRIST;  Surgeon: Margrette Taft BRAVO, MD;  Location: [facility];  Service: Orthopedics;  Laterality: Right;   IR PATIENT EVAL TECH 0-60 MINS  10/07/2023    IR RADIOLOGIST EVAL & MGMT  10/12/2023   KNEE ARTHROSCOPY WITH MEDIAL MENISECTOMY  01/26/2012   Procedure: KNEE ARTHROSCOPY WITH MEDIAL MENISECTOMY;  Surgeon: Taft BRAVO Margrette, MD;  Location: [facility];  Service: Orthopedics;  Laterality: Left;   POLYPECTOMY  08/25/2016   Procedure: POLYPECTOMY;  Surgeon: Harvey Margo CROME, MD;  Location: [facility];  Service: Endoscopy;;   right eye  08/26/2023   vitrectomy with removal of silicone oil   TONSILLECTOMY     VITRECTOMY Right 06/04/2022   Current Outpatient Medications on File Prior to Visit  Medication Sig Dispense Refill   b complex vitamins capsule Take 1 capsule by mouth daily.     CALCIUM  PO Take 1,160 mg by mouth daily.     Cholecalciferol (VITAMIN D3 PO) Take 2,000 Units by mouth 2 (two) times daily.     COMBIGAN 0.2-0.5 % ophthalmic solution Place 1 drop into the right eye 2 (two) times daily.     hydroxyurea  (HYDREA ) 500 MG capsule Take 1 capsule (500 mg total) by mouth daily. May take with food to minimize GI side effects. 30 capsule 2   ibuprofen  (ADVIL ) 600 MG tablet Take 1 tablet (600 mg total) by mouth every 8 (eight) hours as needed. 30 tablet 0   Multiple Vitamin (MULITIVITAMIN WITH MINERALS) TABS Take 1 tablet by mouth daily.     Omega-3 Fatty Acids (FISH OIL) 1200 MG CAPS Take 1,200 mg  by mouth daily.     pilocarpine (PILOCAR) 1 % ophthalmic solution Place 1 drop into the right eye 2 (two) times daily.     sodium chloride  (MURO 128) 2 % ophthalmic solution Place 1 drop into the right eye every 3 (three) hours as needed for eye irritation.     sodium chloride  (MURO 128) 5 % ophthalmic ointment Place 1 Application into the right eye at bedtime.     SUMAtriptan  (IMITREX ) 100 MG tablet Take 1 tablet (100 mg total) by mouth every 2 (two) hours as needed for migraine or headache. May repeat in 2 hours if needed. 10 tablet 1   triamcinolone  cream (KENALOG ) 0.1 % Apply 1 Application topically 2 (two) times daily.  30 g 0   No current facility-administered medications on file prior to visit.   No Known Allergies Social History   Socioeconomic History   Marital status: Married    Spouse name: Not on file   Number of children: Not on file   Years of education: Not on file   Highest education level: Bachelor's degree (e.g., BA, AB, BS)  Occupational History   Not on file  Tobacco Use   Smoking status: Never   Smokeless tobacco: Never  Vaping Use   Vaping status: Never Used  Substance and Sexual Activity   Alcohol use: Not Currently   Drug use: No   Sexual activity: Yes    Birth control/protection: Surgical    Comment: hyst  Other Topics Concern   Not on file  Social History Narrative   Lives at home with husband   Right handed   Caffeine: 2-3 cups/day   Social Drivers of Health   Tobacco Use: Low Risk (01/19/2024)   Patient History    Smoking Tobacco Use: Never    Smokeless Tobacco Use: Never    Passive Exposure: Not on file  Financial Resource Strain: Low Risk (09/14/2023)   Overall Financial Resource Strain (CARDIA)    Difficulty of Paying Living Expenses: Not hard at all  Food Insecurity: No Food Insecurity (12/09/2023)   Epic    Worried About Radiation Protection Practitioner of Food in the Last Year: Never true    Ran Out of Food in the Last Year: Never true  Transportation Needs: No Transportation Needs (12/09/2023)   Epic    Lack of Transportation (Medical): No    Lack of Transportation (Non-Medical): No  Physical Activity: Sufficiently Active (12/09/2023)   Exercise Vital Sign    Days of Exercise per Week: 5 days    Minutes of Exercise per Session: 30 min  Recent Concern: Physical Activity - Insufficiently Active (09/14/2023)   Exercise Vital Sign    Days of Exercise per Week: 6 days    Minutes of Exercise per Session: 20 min  Stress: No Stress Concern Present (12/09/2023)   [employer] of Occupational Health - Occupational Stress Questionnaire    Feeling  of Stress: Not at all  Social Connections: Moderately Integrated (12/09/2023)   Social Connection and Isolation Panel    Frequency of Communication with Friends and Family: More than three times a week    Frequency of Social Gatherings with Friends and Family: Once a week    Attends Religious Services: More than 4 times per year    Active Member of [employer] or Organizations: No    Attends Banker Meetings: Never    Marital Status: Married  Catering Manager Violence: Not At Risk (12/09/2023)   Epic    Fear of  Current or Ex-Partner: No    Emotionally Abused: No    Physically Abused: No    Sexually Abused: No  Depression (PHQ2-9): Low Risk (01/19/2024)   Depression (PHQ2-9)    PHQ-2 Score: 0  Alcohol Screen: Low Risk (09/14/2023)   Alcohol Screen    Last Alcohol Screening Score (AUDIT): 0  Housing: Low Risk (12/09/2023)   Epic    Unable to Pay for Housing in the Last Year: No    Number of Times Moved in the Last Year: 0    Homeless in the Last Year: No  Utilities: Not At Risk (12/09/2023)   Epic    Threatened with loss of utilities: No  Health Literacy: Adequate Health Literacy (12/09/2023)   B1300 Health Literacy    Frequency of need for help with medical instructions: Never   Family History  Problem Relation Age of Onset   Hypertension Mother    Hyperlipidemia Mother    Thyroid  disease Mother    Kidney disease Mother    Snoring Mother    Arthritis Mother    Varicose Veins Mother    Snoring Father    Heart disease Father    Hyperlipidemia Father    Arthritis Father    Hypertension Father    Hyperlipidemia Brother        borderline   Diabetes Maternal Grandmother    Varicose Veins Maternal Grandmother    Cancer Paternal Grandmother        pancreatic   Early death Paternal Grandmother    Cancer Maternal Aunt    Cancer Maternal Uncle    Cancer Paternal Aunt    Alzheimer's disease Paternal Aunt    Cancer Paternal  Uncle    Cancer Paternal Uncle    Colon cancer Neg Hx    Colon polyps Neg Hx    Breast cancer Neg Hx    Sleep apnea Neg Hx       Review of Systems  Respiratory:  Positive for cough.   Neurological:  Positive for headaches.  All other systems reviewed and are negative.      Objective:   Physical Exam Vitals reviewed.  Constitutional:      General: She is not in acute distress.    Appearance: Normal appearance. She is normal weight. She is not ill-appearing, toxic-appearing or diaphoretic.  HENT:     Head: Normocephalic and atraumatic.     Right Ear: Tympanic membrane, ear canal and external ear normal. There is no impacted cerumen.     Left Ear: Tympanic membrane, ear canal and external ear normal. There is no impacted cerumen.     Nose: Congestion and rhinorrhea present.     Mouth/Throat:     Mouth: Mucous membranes are moist.     Pharynx: Oropharynx is clear. No oropharyngeal exudate or posterior oropharyngeal erythema.  Eyes:     General: No visual field deficit.    Extraocular Movements: Extraocular movements intact.     Conjunctiva/sclera: Conjunctivae normal.     Pupils: Pupils are equal, round, and reactive to light.  Neck:     Vascular: No carotid bruit.  Cardiovascular:     Rate and Rhythm: Normal rate and regular rhythm.     Pulses: Normal pulses.     Heart sounds: Normal heart sounds. No murmur heard.    No friction rub. No gallop.  Pulmonary:     Effort: Pulmonary effort is normal. No respiratory distress.     Breath sounds: Normal breath sounds. No stridor. No  wheezing, rhonchi or rales.  Musculoskeletal:     Cervical back: Normal range of motion. No rigidity or tenderness.     Right lower leg: No edema.     Left lower leg: No edema.  Lymphadenopathy:     Cervical: No cervical adenopathy.  Skin:    General: Skin is warm.     Findings: No rash.  Neurological:     General: No focal deficit present.     Mental Status: She is alert and oriented to  person, place, and time. Mental status is at baseline.     Cranial Nerves: No dysarthria or facial asymmetry.     Coordination: Coordination normal.     Deep Tendon Reflexes: Reflexes normal.          Assessment & Plan:  Upper respiratory tract infection, unspecified type  Hyperlipidemia, unspecified hyperlipidemia type - Plan: rosuvastatin  (CRESTOR ) 40 MG tablet Physical exam today is unremarkable.  I did refill her Crestor  that she takes for hyperlipidemia given history of stroke.  Recommended Mucinex DM for cough.  Recommended ibuprofen  or Tylenol  for fever or bodyaches.  I suspect that the patient has a viral upper respiratory infection and is likely contagious for the next 5 or 6 days.  Recommended that she try to limit her contact is much as possible, rest, and push fluids.  She declines cough medication. "

## 2024-01-25 ENCOUNTER — Encounter: Payer: Self-pay | Admitting: *Deleted

## 2024-02-19 ENCOUNTER — Inpatient Hospital Stay: Attending: Oncology

## 2024-02-19 ENCOUNTER — Inpatient Hospital Stay: Admitting: Oncology

## 2024-02-19 VITALS — BP 132/83 | HR 73 | Temp 98.9°F | Resp 18 | Ht 62.0 in | Wt 122.0 lb

## 2024-02-19 DIAGNOSIS — Z862 Personal history of diseases of the blood and blood-forming organs and certain disorders involving the immune mechanism: Secondary | ICD-10-CM | POA: Insufficient documentation

## 2024-02-19 DIAGNOSIS — D473 Essential (hemorrhagic) thrombocythemia: Secondary | ICD-10-CM

## 2024-02-19 DIAGNOSIS — D1771 Benign lipomatous neoplasm of kidney: Secondary | ICD-10-CM | POA: Diagnosis not present

## 2024-02-19 DIAGNOSIS — D75839 Thrombocytosis, unspecified: Secondary | ICD-10-CM | POA: Insufficient documentation

## 2024-02-19 DIAGNOSIS — Z7982 Long term (current) use of aspirin: Secondary | ICD-10-CM | POA: Insufficient documentation

## 2024-02-19 DIAGNOSIS — D509 Iron deficiency anemia, unspecified: Secondary | ICD-10-CM | POA: Diagnosis not present

## 2024-02-19 DIAGNOSIS — Z1589 Genetic susceptibility to other disease: Secondary | ICD-10-CM

## 2024-02-19 DIAGNOSIS — N2889 Other specified disorders of kidney and ureter: Secondary | ICD-10-CM | POA: Diagnosis not present

## 2024-02-19 DIAGNOSIS — D72829 Elevated white blood cell count, unspecified: Secondary | ICD-10-CM

## 2024-02-19 LAB — CBC WITH DIFFERENTIAL/PLATELET
Abs Immature Granulocytes: 0.02 K/uL (ref 0.00–0.07)
Basophils Absolute: 0.1 K/uL (ref 0.0–0.1)
Basophils Relative: 1 %
Eosinophils Absolute: 0.1 K/uL (ref 0.0–0.5)
Eosinophils Relative: 1 %
HCT: 41.6 % (ref 36.0–46.0)
Hemoglobin: 13.5 g/dL (ref 12.0–15.0)
Immature Granulocytes: 0 %
Lymphocytes Relative: 31 %
Lymphs Abs: 2.3 K/uL (ref 0.7–4.0)
MCH: 33.5 pg (ref 26.0–34.0)
MCHC: 32.5 g/dL (ref 30.0–36.0)
MCV: 103.2 fL — ABNORMAL HIGH (ref 80.0–100.0)
Monocytes Absolute: 0.4 K/uL (ref 0.1–1.0)
Monocytes Relative: 5 %
Neutro Abs: 4.6 K/uL (ref 1.7–7.7)
Neutrophils Relative %: 62 %
Platelets: 402 K/uL — ABNORMAL HIGH (ref 150–400)
RBC: 4.03 MIL/uL (ref 3.87–5.11)
RDW: 15.3 % (ref 11.5–15.5)
WBC: 7.4 K/uL (ref 4.0–10.5)
nRBC: 0 % (ref 0.0–0.2)

## 2024-02-19 LAB — COMPREHENSIVE METABOLIC PANEL WITH GFR
ALT: 39 U/L (ref 0–44)
AST: 37 U/L (ref 15–41)
Albumin: 4.6 g/dL (ref 3.5–5.0)
Alkaline Phosphatase: 74 U/L (ref 38–126)
Anion gap: 14 (ref 5–15)
BUN: 15 mg/dL (ref 8–23)
CO2: 25 mmol/L (ref 22–32)
Calcium: 9.1 mg/dL (ref 8.9–10.3)
Chloride: 104 mmol/L (ref 98–111)
Creatinine, Ser: 0.61 mg/dL (ref 0.44–1.00)
GFR, Estimated: >60 mL/min
Glucose, Bld: 90 mg/dL (ref 70–99)
Potassium: 4.1 mmol/L (ref 3.5–5.1)
Sodium: 142 mmol/L (ref 135–145)
Total Bilirubin: 0.4 mg/dL (ref 0.0–1.2)
Total Protein: 7.2 g/dL (ref 6.5–8.1)

## 2024-02-19 LAB — IRON AND TIBC
Iron: 61 ug/dL (ref 28–170)
Saturation Ratios: 17 % (ref 10.4–31.8)
TIBC: 358 ug/dL (ref 250–450)
UIBC: 298 ug/dL

## 2024-02-19 LAB — FERRITIN: Ferritin: 246 ng/mL (ref 11–307)

## 2024-02-19 LAB — VITAMIN B12: Vitamin B-12: 869 pg/mL (ref 180–914)

## 2024-02-19 LAB — FOLATE: Folate: 19.6 ng/mL

## 2024-02-19 NOTE — Progress Notes (Signed)
 " [facility] at [facility]  HEMATOLOGY FOLLOW-UP VISIT  Kylie Butler DASEN, MD  REASON FOR FOLLOW-UP: Essential thrombocythemia  ASSESSMENT & PLAN:  Patient is a 69 y.o. female following for thrombocythemia  Essential Thrombocythemia (HCC) Presented with thrombocytosis JAK2 mutation: positive BMBx consistent with ET MPN-SAF TSS- 2 points out of 100- 1 point for fatigue and weight loss BCR ABL pending at this time No splenomegaly on ultrasound   -Patient is high risk with age >60 years and JAK2 mutation. She will meet criteria for cytoreduction - Started on hydroxyurea  500 mg twice daily for cytoreduction.  Patient had some rash and nausea.  Dose reduced to hydroxyurea  500 mg once daily today.   -Patient tolerating hydroxyurea  500 mg once daily very well.  No side effects at this time. - We reviewed labs today.  CMP: WNL, CBC: No anemia, macrocytosis present, platelets: 402 -Continue aspirin  daily -Will follow up on BCR/ABL-ordered today   Return to clinic in 3 months for assessment of tolerance of hydrea .  Iron Deficiency Anemia, unspecified type irion deficiency anemia type Very mild iron deficiency with a TSAT of 16 Iron labs from today pending at this time.  Will review when resulted.   - Can discontinue iron at this time as patient has no anemia.  Bleeding Patient was concerned about acquired von Willebrand disease  - Labs obtained today.  Pending at this time.  Right renal mass Concerning for angiomyolipoma.  Repeat suggested in 6 months  -Will repeat after ultrasound in 6 months.  Due 04/2024  Rash Rash resolved at this time  Orders Placed This Encounter  Procedures   US  RENAL    Standing Status:   Future    Expected Date:   05/19/2024    Expiration Date:   02/18/2025    Reason for Exam (SYMPTOM  OR DIAGNOSIS REQUIRED):   right renal angiomyolipoma    Preferred imaging location?:   [facility]   CBC with Differential/Platelet     Standing Status:   Future    Expected Date:   05/16/2024    Expiration Date:   08/14/2024   Comprehensive metabolic panel with GFR    Standing Status:   Future    Expected Date:   05/16/2024    Expiration Date:   08/14/2024    The total time spent in the appointment was 25 minutes encounter with patients including review of chart and various tests results, discussions about plan of care and coordination of care plan   All questions were answered. The patient knows to call the clinic with any problems, questions or concerns. No barriers to learning was detected.  Mickiel Dry, MD 1/23/20263:31 PM   SUMMARY OF HEMATOLOGIC HISTORY: Patient has chronic thrombocytosis since at least 2015.   -JAK2 mutation: positive - Iron panel: TSAT: 16, ferritin: 109 - CRP: 1.2-elevated, sed rate: 4-Normal - LDH: 220, uric acid: 3.5 -10/07/2023: BMBx: Variably cellular marrow (20-50%) involved by JAK2 mutated  myeloproliferative neoplasm              -The overall findings of persistent and progressive peripheral blood  thrombocytosis, along with  proliferation and atypia of megakaryocytes, and positive JAK2-mutation testing are consistent with a  myeloproliferative neoplasm with essential thrombocythemia being  favored.              -Importantly, there is no overt increase in blasts or fibrosis.              -Normal  female karyotype -BCR/ABL: Pending - Ultrasound abdomen: No splenomegaly - 10/15/2023-current: Hydroxyurea  500 mg twice daily  - 11/13/2023: Dose reduced to hydroxyurea  500 mg once daily for rash and nausea   INTERVAL HISTORY: Discussed the use of AI scribe software for clinical note transcription with the patient, who gave verbal consent to proceed.  History of Present Illness Kylie Duffy is a 69 year old female with essential thrombocythemia who presents for hematology follow-up.  She is currently treated with hydroxyurea  500 mg daily for essential thrombocythemia, with platelet  count improved to 402. She previously took hydroxyurea  twice daily but reduced to once daily without adverse effects. She denies oral or cutaneous rashes, and notes a few fading purpuric lesions. No new or concerning symptoms have developed, and she reports satisfaction with her current regimen.  She discontinued iron supplementation following resolution of anemia; recent iron studies are pending. She is awaiting results of von Willebrand factor testing, which was ordered with her current laboratory panel. Repeat BCR-ABL testing was performed today due to a prior sample processing error.  A right renal angiomyolipoma was previously identified and is under interval ultrasound surveillance, with follow-up planned in three months. She reports no new symptoms or concerns related to the angiomyolipoma.    I have reviewed the past medical history, past surgical history, social history and family history with the patient   ALLERGIES:  has no known allergies.  MEDICATIONS:  Current Outpatient Medications  Medication Sig Dispense Refill   aspirin  EC 81 MG tablet Take 1 tablet (81 mg total) by mouth daily. Swallow whole. 30 tablet 12   CALCIUM  PO Take 1,160 mg by mouth daily.     Cholecalciferol (VITAMIN D3 PO) Take 2,000 Units by mouth 2 (two) times daily. (Patient taking differently: Take 2,000 Units by mouth 2 (two) times daily. Once a day)     COMBIGAN 0.2-0.5 % ophthalmic solution Place 1 drop into the right eye 2 (two) times daily.     hydroxyurea  (HYDREA ) 500 MG capsule Take 1 capsule (500 mg total) by mouth daily. May take with food to minimize GI side effects. 30 capsule 2   ibuprofen  (ADVIL ) 600 MG tablet Take 1 tablet (600 mg total) by mouth every 8 (eight) hours as needed. 30 tablet 0   Multiple Vitamin (MULITIVITAMIN WITH MINERALS) TABS Take 1 tablet by mouth daily.     Omega-3 Fatty Acids (FISH OIL) 1200 MG CAPS Take 1,200 mg by mouth daily.     pilocarpine (PILOCAR) 1 % ophthalmic  solution Place 1 drop into the right eye 2 (two) times daily.     RHOPRESSA 0.02 % SOLN Place 1 drop into the right eye at bedtime.     rosuvastatin  (CRESTOR ) 40 MG tablet Take 1 tablet (40 mg total) by mouth daily. 90 tablet 2   sodium chloride  (MURO 128) 2 % ophthalmic solution Place 1 drop into the right eye every 3 (three) hours as needed for eye irritation.     sodium chloride  (MURO 128) 5 % ophthalmic ointment Place 1 Application into the right eye at bedtime.     SUMAtriptan  (IMITREX ) 100 MG tablet Take 1 tablet (100 mg total) by mouth every 2 (two) hours as needed for migraine or headache. May repeat in 2 hours if needed. 10 tablet 1   triamcinolone  cream (KENALOG ) 0.1 % Apply 1 Application topically 2 (two) times daily. 30 g 0   No current facility-administered medications for this visit.     PHYSICAL EXAMINATION:  Vitals:   02/19/24 1441  BP: 132/83  Pulse: 73  Resp: 18  Temp: 98.9 F (37.2 C)  SpO2: 98%     GENERAL:alert, no distress and comfortable SKIN: Slight erythematous rash on the neck, healing at this time. LYMPH:  no palpable lymphadenopathy in the cervical, axillary or inguinal LUNGS: clear to auscultation and percussion with normal breathing effort HEART: regular rate & rhythm and no murmurs and no lower extremity edema ABDOMEN:abdomen soft, non-tender and normal bowel sounds Musculoskeletal:no cyanosis of digits and no clubbing  NEURO: alert & oriented x 3 with fluent speech  LABORATORY DATA:  I have reviewed the data as listed  Lab Results  Component Value Date   WBC 7.4 02/19/2024   NEUTROABS 4.6 02/19/2024   HGB 13.5 02/19/2024   HCT 41.6 02/19/2024   MCV 103.2 (H) 02/19/2024   PLT 402 (H) 02/19/2024        Chemistry      Component Value Date/Time   NA 142 02/19/2024 1404   K 4.1 02/19/2024 1404   CL 104 02/19/2024 1404   CO2 25 02/19/2024 1404   BUN 15 02/19/2024 1404   CREATININE 0.61 02/19/2024 1404   CREATININE 0.65 07/30/2023  0912      Component Value Date/Time   CALCIUM  9.1 02/19/2024 1404   ALKPHOS 74 02/19/2024 1404   AST 37 02/19/2024 1404   ALT 39 02/19/2024 1404   BILITOT 0.4 02/19/2024 1404         RADIOGRAPHIC STUDIES: I have personally reviewed the radiological images as listed and agreed with the findings in the report. "

## 2024-02-20 LAB — COAG STUDIES INTERP REPORT

## 2024-02-20 LAB — VON WILLEBRAND PANEL
Coagulation Factor VIII: 61 % (ref 56–140)
Ristocetin Co-factor, Plasma: 58 % (ref 50–200)
Von Willebrand Antigen, Plasma: 55 % (ref 50–200)

## 2024-02-22 ENCOUNTER — Ambulatory Visit: Payer: Self-pay | Admitting: Oncology

## 2024-02-26 LAB — BCR-ABL1, CML/ALL, PCR, QUANT
E1A2 Transcript: <0.0032 %
Interpretation (BCRAL):: NEGATIVE
b2a2 transcript: <0.0032 %
b3a2 transcript: <0.0032 %

## 2024-05-12 ENCOUNTER — Inpatient Hospital Stay

## 2024-05-12 ENCOUNTER — Other Ambulatory Visit (HOSPITAL_COMMUNITY)

## 2024-05-19 ENCOUNTER — Inpatient Hospital Stay: Admitting: Oncology

## 2024-08-05 ENCOUNTER — Other Ambulatory Visit

## 2024-08-08 ENCOUNTER — Encounter: Admitting: Family Medicine

## 2024-12-14 ENCOUNTER — Ambulatory Visit
